# Patient Record
Sex: Female | Born: 1965 | Race: White | Hispanic: No | Marital: Single | Smoking: Never smoker
Health system: Southern US, Community
[De-identification: ages and names within clinical notes are randomized; demographics above are authoritative.]

## PROBLEM LIST (undated history)

## (undated) DIAGNOSIS — E785 Hyperlipidemia, unspecified: Secondary | ICD-10-CM

## (undated) DIAGNOSIS — F419 Anxiety disorder, unspecified: Secondary | ICD-10-CM

## (undated) DIAGNOSIS — K2270 Barrett's esophagus without dysplasia: Secondary | ICD-10-CM

## (undated) DIAGNOSIS — S3992XA Unspecified injury of lower back, initial encounter: Secondary | ICD-10-CM

## (undated) DIAGNOSIS — F32A Depression, unspecified: Secondary | ICD-10-CM

## (undated) DIAGNOSIS — F329 Major depressive disorder, single episode, unspecified: Secondary | ICD-10-CM

## (undated) DIAGNOSIS — R002 Palpitations: Secondary | ICD-10-CM

## (undated) DIAGNOSIS — N390 Urinary tract infection, site not specified: Secondary | ICD-10-CM

## (undated) DIAGNOSIS — G2581 Restless legs syndrome: Secondary | ICD-10-CM

## (undated) DIAGNOSIS — G4700 Insomnia, unspecified: Secondary | ICD-10-CM

## (undated) DIAGNOSIS — K589 Irritable bowel syndrome without diarrhea: Secondary | ICD-10-CM

## (undated) DIAGNOSIS — IMO0002 Reserved for concepts with insufficient information to code with codable children: Secondary | ICD-10-CM

## (undated) DIAGNOSIS — M545 Low back pain, unspecified: Secondary | ICD-10-CM

## (undated) DIAGNOSIS — K6389 Other specified diseases of intestine: Secondary | ICD-10-CM

## (undated) DIAGNOSIS — K219 Gastro-esophageal reflux disease without esophagitis: Secondary | ICD-10-CM

## (undated) DIAGNOSIS — N189 Chronic kidney disease, unspecified: Secondary | ICD-10-CM

## (undated) DIAGNOSIS — T7840XA Allergy, unspecified, initial encounter: Secondary | ICD-10-CM

## (undated) DIAGNOSIS — B009 Herpesviral infection, unspecified: Secondary | ICD-10-CM

## (undated) DIAGNOSIS — R079 Chest pain, unspecified: Secondary | ICD-10-CM

## (undated) DIAGNOSIS — K449 Diaphragmatic hernia without obstruction or gangrene: Secondary | ICD-10-CM

## (undated) HISTORY — DX: Urinary tract infection, site not specified: N39.0

## (undated) HISTORY — DX: Insomnia, unspecified: G47.00

## (undated) HISTORY — DX: Low back pain: M54.5

## (undated) HISTORY — DX: Palpitations: R00.2

## (undated) HISTORY — DX: Allergy, unspecified, initial encounter: T78.40XA

## (undated) HISTORY — DX: Low back pain, unspecified: M54.50

## (undated) HISTORY — DX: Herpesviral infection, unspecified: B00.9

## (undated) HISTORY — PX: UPPER GASTROINTESTINAL ENDOSCOPY: SHX188

## (undated) HISTORY — PX: REFRACTIVE SURGERY: SHX103

## (undated) HISTORY — DX: Anxiety disorder, unspecified: F41.9

## (undated) HISTORY — DX: Major depressive disorder, single episode, unspecified: F32.9

## (undated) HISTORY — DX: Chest pain, unspecified: R07.9

## (undated) HISTORY — DX: Depression, unspecified: F32.A

## (undated) HISTORY — DX: Reserved for concepts with insufficient information to code with codable children: IMO0002

## (undated) HISTORY — DX: Hyperlipidemia, unspecified: E78.5

## (undated) HISTORY — DX: Diaphragmatic hernia without obstruction or gangrene: K44.9

## (undated) HISTORY — DX: Irritable bowel syndrome, unspecified: K58.9

## (undated) HISTORY — DX: Unspecified injury of lower back, initial encounter: S39.92XA

## (undated) HISTORY — DX: Gastro-esophageal reflux disease without esophagitis: K21.9

## (undated) HISTORY — PX: OTHER SURGICAL HISTORY: SHX169

## (undated) HISTORY — DX: Restless legs syndrome: G25.81

## (undated) HISTORY — DX: Chronic kidney disease, unspecified: N18.9

---

## 1999-05-28 HISTORY — PX: EYE SURGERY: SHX253

## 2000-12-25 ENCOUNTER — Other Ambulatory Visit: Admission: RE | Admit: 2000-12-25 | Discharge: 2000-12-25 | Payer: Self-pay | Admitting: Gynecology

## 2003-01-06 ENCOUNTER — Other Ambulatory Visit: Admission: RE | Admit: 2003-01-06 | Discharge: 2003-01-06 | Payer: Self-pay | Admitting: Gynecology

## 2005-03-19 ENCOUNTER — Other Ambulatory Visit: Admission: RE | Admit: 2005-03-19 | Discharge: 2005-03-19 | Payer: Self-pay | Admitting: Gynecology

## 2005-07-22 ENCOUNTER — Encounter: Admission: RE | Admit: 2005-07-22 | Discharge: 2005-07-22 | Payer: Self-pay | Admitting: Sports Medicine

## 2005-08-05 ENCOUNTER — Encounter: Admission: RE | Admit: 2005-08-05 | Discharge: 2005-08-05 | Payer: Self-pay | Admitting: Sports Medicine

## 2006-02-06 ENCOUNTER — Encounter: Admission: RE | Admit: 2006-02-06 | Discharge: 2006-02-06 | Payer: Self-pay | Admitting: Sports Medicine

## 2006-03-25 ENCOUNTER — Other Ambulatory Visit: Admission: RE | Admit: 2006-03-25 | Discharge: 2006-03-25 | Payer: Self-pay | Admitting: Gynecology

## 2006-05-30 ENCOUNTER — Encounter: Admission: RE | Admit: 2006-05-30 | Discharge: 2006-05-30 | Payer: Self-pay | Admitting: Sports Medicine

## 2006-06-13 ENCOUNTER — Encounter: Admission: RE | Admit: 2006-06-13 | Discharge: 2006-06-13 | Payer: Self-pay | Admitting: Sports Medicine

## 2006-09-10 ENCOUNTER — Ambulatory Visit: Payer: Self-pay

## 2006-12-23 ENCOUNTER — Ambulatory Visit: Payer: Self-pay

## 2007-03-04 ENCOUNTER — Ambulatory Visit: Payer: Self-pay | Admitting: Internal Medicine

## 2007-03-04 LAB — CONVERTED CEMR LAB
Basophils Relative: 0.5 % (ref 0.0–1.0)
Eosinophils Absolute: 0.1 10*3/uL (ref 0.0–0.6)
HCT: 37.1 % (ref 36.0–46.0)
MCHC: 34.9 g/dL (ref 30.0–36.0)
Neutro Abs: 5 10*3/uL (ref 1.4–7.7)
Neutrophils Relative %: 64.8 % (ref 43.0–77.0)
RBC: 4.22 M/uL (ref 3.87–5.11)
RDW: 11.7 % (ref 11.5–14.6)

## 2007-03-18 ENCOUNTER — Encounter: Payer: Self-pay | Admitting: Internal Medicine

## 2007-04-13 ENCOUNTER — Other Ambulatory Visit: Admission: RE | Admit: 2007-04-13 | Discharge: 2007-04-13 | Payer: Self-pay | Admitting: Gynecology

## 2007-05-13 ENCOUNTER — Encounter: Admission: RE | Admit: 2007-05-13 | Discharge: 2007-05-13 | Payer: Self-pay | Admitting: Otolaryngology

## 2007-05-13 ENCOUNTER — Encounter: Admission: RE | Admit: 2007-05-13 | Discharge: 2007-05-13 | Payer: Self-pay | Admitting: Sports Medicine

## 2007-06-04 ENCOUNTER — Encounter: Admission: RE | Admit: 2007-06-04 | Discharge: 2007-06-04 | Payer: Self-pay | Admitting: Sports Medicine

## 2008-04-12 ENCOUNTER — Encounter: Payer: Self-pay | Admitting: Pulmonary Disease

## 2008-04-13 ENCOUNTER — Encounter: Payer: Self-pay | Admitting: Gynecology

## 2008-04-13 ENCOUNTER — Other Ambulatory Visit: Admission: RE | Admit: 2008-04-13 | Discharge: 2008-04-13 | Payer: Self-pay | Admitting: Gynecology

## 2008-04-13 ENCOUNTER — Ambulatory Visit: Payer: Self-pay | Admitting: Gynecology

## 2008-05-19 ENCOUNTER — Ambulatory Visit: Payer: Self-pay | Admitting: Pulmonary Disease

## 2008-05-19 DIAGNOSIS — M5126 Other intervertebral disc displacement, lumbar region: Secondary | ICD-10-CM | POA: Insufficient documentation

## 2008-05-19 DIAGNOSIS — G2581 Restless legs syndrome: Secondary | ICD-10-CM | POA: Insufficient documentation

## 2008-05-19 DIAGNOSIS — G4733 Obstructive sleep apnea (adult) (pediatric): Secondary | ICD-10-CM | POA: Insufficient documentation

## 2008-06-03 ENCOUNTER — Telehealth: Payer: Self-pay | Admitting: Pulmonary Disease

## 2008-06-15 ENCOUNTER — Ambulatory Visit: Payer: Self-pay | Admitting: Pulmonary Disease

## 2008-07-04 ENCOUNTER — Telehealth (INDEPENDENT_AMBULATORY_CARE_PROVIDER_SITE_OTHER): Payer: Self-pay | Admitting: *Deleted

## 2008-10-05 ENCOUNTER — Encounter: Payer: Self-pay | Admitting: Internal Medicine

## 2008-10-06 ENCOUNTER — Telehealth (INDEPENDENT_AMBULATORY_CARE_PROVIDER_SITE_OTHER): Payer: Self-pay | Admitting: *Deleted

## 2008-10-17 ENCOUNTER — Encounter (INDEPENDENT_AMBULATORY_CARE_PROVIDER_SITE_OTHER): Payer: Self-pay | Admitting: *Deleted

## 2008-10-17 ENCOUNTER — Ambulatory Visit: Payer: Self-pay | Admitting: Cardiovascular Disease

## 2008-10-17 ENCOUNTER — Encounter: Payer: Self-pay | Admitting: Nurse Practitioner

## 2008-10-19 ENCOUNTER — Telehealth (INDEPENDENT_AMBULATORY_CARE_PROVIDER_SITE_OTHER): Payer: Self-pay

## 2008-10-20 ENCOUNTER — Ambulatory Visit: Payer: Self-pay

## 2008-10-20 ENCOUNTER — Encounter: Payer: Self-pay | Admitting: Internal Medicine

## 2008-10-26 ENCOUNTER — Telehealth (INDEPENDENT_AMBULATORY_CARE_PROVIDER_SITE_OTHER): Payer: Self-pay | Admitting: *Deleted

## 2009-04-14 ENCOUNTER — Ambulatory Visit: Payer: Self-pay | Admitting: Women's Health

## 2009-04-14 ENCOUNTER — Other Ambulatory Visit: Admission: RE | Admit: 2009-04-14 | Discharge: 2009-04-14 | Payer: Self-pay | Admitting: Gynecology

## 2010-04-25 ENCOUNTER — Other Ambulatory Visit: Admission: RE | Admit: 2010-04-25 | Discharge: 2010-04-25 | Payer: Self-pay | Admitting: Gynecology

## 2010-04-25 ENCOUNTER — Ambulatory Visit: Payer: Self-pay | Admitting: Women's Health

## 2010-06-17 ENCOUNTER — Encounter: Payer: Self-pay | Admitting: Sports Medicine

## 2010-10-09 NOTE — Letter (Signed)
March 04, 2007    Pamela Welch, M.D.  [address]  [place], [place] [address]   RE:  Pamela Welch  MRN:  [id number]  /  DOB:  [birth year]   Dear Pamela Welch:   Please forgive me for not getting up with you by telephone. I saw Pamela Welch this morning. I am going to be on vacation for the rest of the  week. 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 This prompted a cardiac evaluation which included a  Myoview in our office that was normal with perfusion defect thought to  be breast attenuation.   She had another episode in July where here breathing just was not normal  and because of that was given a 30 day even monitor which was associated  with similar, but less symptomatic episodes. She is quite clear however  that these were more of the same only less so.   These typically last just moments, clearly less than 60 seconds. She  drinks a fair amount of caffeine five beverages or so a day, as well as  using chocolate, and stress clearly aggravates these.   Her functional capacity is otherwise unimpaired.   CURRENT MEDICATIONS:  1. Lipitor.  2. Zyrtec.   ALLERGIES:  No known drug allergies.   REVIEW OF SYSTEMS:  Is noncontributory.   PAST MEDICAL HISTORY:  In addition to the above is a herniated lumbar  disc for which she has steroid injections which was temporarily  associated with weight gain.   PHYSICAL EXAMINATION:  She is a middle-aged Caucasian female appearing  her stated age of 45. Her blood pressure is 112/32, pulse 72. Weight was  139. She is 5 feet, 7 inches.  HEENT: Demonstrated no icterus or xanthoma.  The neck veins were flat. The carotids are brisk and full bilaterally  without bruits.  BACK: Is without kyphosis, scoliosis.  LUNGS:  Were clear.  HEART: Sounds were regular without murmurs or gallops.  ABDOMEN: Soft with active bowel sounds without midline pulsation or  hepatomegaly.  Femoral  pulses were 2+, distal pulses were intact. There is no clubbing,  cyanosis or edema.  NEUROLOGIC: Examination was grossly normal.  SKIN: Was warm and dry.   Electrocardiogram dated today demonstrated sinus rhythm at 58 with  intervals of 0.17/0.08/0.42. The axis was 48 degrees. The  electrocardiogram was normal.   Review of the event recorder demonstrated symptomatic PVCs, sometimes  coming with variable frequency. Reproducibly that was what was seen.  There was a little bit of sinus arrhythmia.   IMPRESSION:  1. Symptomatic PVCs.  2. Structurally normal heart by Myoview and electrocardiogram.   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 In terms of looking for primary  causes, I thought we should check her TSH and her magnesium and a BMET  to make sure the electrolytes are all okay.   In the event that these are normal, I have reassured her that  prognostically the risks are small and that she should be fine to  continue on about her daily life.   If there is anything further I can do, please do not hesitate to contact  me. Again, thank you for the consultation and forgive me again for not  having been able to get up with you by telephone.    Sincerely,      Duke Salvia, MD, [facility]  Electronically Signed   SCK/MedQ  DD: 03/04/2007  DT: 03/04/2007  Job #: [id number]

## 2011-02-08 ENCOUNTER — Ambulatory Visit: Payer: Self-pay | Admitting: Physician Assistant

## 2011-02-11 ENCOUNTER — Encounter: Payer: Self-pay | Admitting: Physician Assistant

## 2011-02-12 ENCOUNTER — Encounter: Payer: Self-pay | Admitting: Physician Assistant

## 2011-02-12 ENCOUNTER — Ambulatory Visit (INDEPENDENT_AMBULATORY_CARE_PROVIDER_SITE_OTHER): Payer: BC Managed Care – PPO | Admitting: Physician Assistant

## 2011-02-12 VITALS — BP 117/81 | HR 92 | Resp 18 | Ht 69.0 in | Wt 152.0 lb

## 2011-02-12 DIAGNOSIS — R079 Chest pain, unspecified: Secondary | ICD-10-CM

## 2011-02-12 DIAGNOSIS — R0602 Shortness of breath: Secondary | ICD-10-CM

## 2011-02-12 DIAGNOSIS — R42 Dizziness and giddiness: Secondary | ICD-10-CM

## 2011-02-12 NOTE — Progress Notes (Signed)
History of Present Illness: Primary Electrophysiologist:  Dr. Sherryl Manges   Pamela Welch is a 45 y.o. female who presents for chest pain.  She has a h/o symptomatic PVCs and atypical chest pain. She is status post normal Myoview in 2000.  She was last seen in 5/10.  At that time she underwent an echocardiogram that demonstrated an EF of 60-65% mild LVH.  She was to have a stress test performed due to chest pain and increased palpitations.  However, this was never performed.  She has had a series of symptoms recently.  She has been noticing lightheadedness with standing for the last several weeks.  She notes associated tachy-palps.  No chest pain with this.  She does note some dyspnea with exertion.  She typically walks on the treadmill for 3 miles every day.  She had one episode of dizziness with going up the steps after getting off the treadmill 3 weeks ago.  She felt near syncopal.  Does not think she passed out.  She felt dyspneic and nauseated and had some chest pain.  She says her symptoms lasted about 40 minutes.  She felt completely normal after this.  She is able to walk on the treadmill without symptoms.  She notes problems with dizziness with increasing activity like jogging.  She denies orthopnea, PND or edema.  She has chronic palpitations.  She denies any changes.  She does note a h/o getting lightheaded back in April and fell and hit her head and was told she had a concussion.  She went to her PCP.  She was told her EKG was abnormal and she should follow up here.  Past Medical History  Diagnosis Date  . Chest pain, unspecified     normal myoview 2000;  echo 5/10:  mild LVH, EF 60-65%  . Palpitations     h/o symptomatic PVCs  . Restless legs syndrome (RLS)   . Obstructive sleep apnea (adult) (pediatric)   . Persistent disorder of initiating or maintaining sleep   . Anxiety   . Hyperlipidemia   . Herniated disc     Current Outpatient Prescriptions  Medication Sig Dispense Refill    . aspirin 81 MG tablet Take 81 mg by mouth daily.        Marland Kitchen atorvastatin (LIPITOR) 40 MG tablet Take 40 mg by mouth daily.        . Cholecalciferol (VITAMIN D3) 2000 UNITS TABS Take 1 tablet by mouth 2 (two) times daily.        . Coral Calcium 1000 (390 CA) MG TABS Take 1 tablet by mouth daily.        Marland Kitchen LORazepam (ATIVAN) 1 MG tablet Take 1 mg by mouth at bedtime.        . Magnesium 400 MG CAPS Take 1 capsule by mouth 2 (two) times daily.        . Multiple Vitamin (MULTIVITAMIN) tablet Take 1 tablet by mouth daily.        . Omega-3 Fatty Acids (FISH OIL) 1200 MG CAPS Take by mouth. Take about 2-3 times a week         Allergies: No Known Allergies  Social history:  Nonsmoker.  Rare alcohol  Family history:  No CAD in any first degree relatives.  She did have a grandmother and grandfather both died of myocardial infarction her 88s.  She has cousins and aunts who also had CAD.  ROS:  Please see the history of present illness.  All other  systems reviewed and negative.   Vital Signs: BP 120/80  Pulse 78  Resp 18  Ht 5\' 9"  (1.753 m)  Wt 152 lb (68.947 kg)  BMI 22.45 kg/m2  Filed Vitals:   02/12/11 1649 02/12/11 1650 02/12/11 1651 02/12/11 1652  BP: 111/73 116/78 116/81 117/81  Pulse: 79 82 89 92  Resp:      Height:      Weight:        PHYSICAL EXAM: Well nourished, well developed, in no acute distress HEENT: normal Neck: no JVD Endocrine: No thyromegaly Vascular: No carotid bruits Cardiac:  normal S1, S2; RRR; no murmur Lungs:  clear to auscultation bilaterally, no wheezing, rhonchi or rales Abd: soft, nontender, no hepatomegaly Ext: no edema Skin: warm and dry Neuro:  CNs 2-12 intact, no focal abnormalities noted Psych: Normal affect  EKG:  Sinus rhythm, heart rate 78, normal axis, Q waves in V1-V2, nonspecific ST-T wave changes, no significant change compared to prior tracings  ASSESSMENT AND PLAN:

## 2011-02-12 NOTE — Patient Instructions (Signed)
Your physician has requested that you have en exercise stress myoview DX SOB 786.05, CHEST PAIN 786.50 DIZZINESS 780.4. For further information please visit [email or web address] Please follow instruction sheet, as given.  Your physician has requested that you have an echocardiogram DX DIZZINESS 780.4, SOB 786.05. Echocardiography is a painless test that uses sound waves to create images of your heart. It provides your doctor with information about the size and shape of your heart and how well your heart's chambers and valves are working. This procedure takes approximately one hour. There are no restrictions for this procedure.   Your physician has recommended that you wear an event monitor DX 786.05 SOB, DIZZINESS 780.4. Event monitors are medical devices that record the heart's electrical activity. Doctors most often [place] these monitors to diagnose arrhythmias. Arrhythmias are problems with the speed or rhythm of the heartbeat. The monitor is a small, portable device. You can wear one while you do your normal daily activities. This is usually used to diagnose what is causing palpitations/syncope (passing out).   Your physician recommends that you schedule a follow-up appointment in: 04/01/11 @ 2 PM TO SEE [employer], PA-C

## 2011-02-12 NOTE — Assessment & Plan Note (Signed)
Proceed with Myoview and echocardiogram as noted.

## 2011-02-12 NOTE — Assessment & Plan Note (Signed)
Proceed with Myoview as noted.  

## 2011-02-12 NOTE — Assessment & Plan Note (Addendum)
Etiology unclear.  She does report a history of yellow-colored urine.  Some of her symptoms sound orthostatic in nature.  We will check orthostatic vital signs today.  I recommended that she hydrate herself well.  I will asked for labs obtained by her PCP recently.  If she has not had a CBC, BMET, TSH, these will be obtained.  Her symptoms seem to occur with exertion.  She is quite concerned about having CAD.  I will have her undergo a stress Myoview.  Her ECG is unchanged.  We will try to obtain an ECG from her PCPs office.  I will have her undergo an echocardiogram.  I will also have her undergo an event monitor.  She can followup in 4-6 weeks after the above testing is completed with either Dr. Graciela Husbands or me.  Did receive labs at end of visit:  BUN 14, creatinine 1.23, TC 151, TG 122, HDL 60, LDL 67, Mg 1.9, TSH 1.622, Hgb 13.8, K 4.9. ECG reviewed and unchanged when compared with today's tracing.

## 2011-02-18 ENCOUNTER — Encounter: Payer: Self-pay | Admitting: *Deleted

## 2011-02-22 ENCOUNTER — Encounter: Payer: Self-pay | Admitting: Physician Assistant

## 2011-02-26 ENCOUNTER — Encounter (INDEPENDENT_AMBULATORY_CARE_PROVIDER_SITE_OTHER): Payer: BC Managed Care – PPO

## 2011-02-26 ENCOUNTER — Ambulatory Visit (HOSPITAL_BASED_OUTPATIENT_CLINIC_OR_DEPARTMENT_OTHER): Payer: BC Managed Care – PPO | Admitting: Radiology

## 2011-02-26 ENCOUNTER — Ambulatory Visit (HOSPITAL_COMMUNITY): Payer: BC Managed Care – PPO | Attending: Cardiology

## 2011-02-26 DIAGNOSIS — R42 Dizziness and giddiness: Secondary | ICD-10-CM

## 2011-02-26 DIAGNOSIS — R0602 Shortness of breath: Secondary | ICD-10-CM

## 2011-02-26 DIAGNOSIS — R0609 Other forms of dyspnea: Secondary | ICD-10-CM | POA: Insufficient documentation

## 2011-02-26 DIAGNOSIS — R079 Chest pain, unspecified: Secondary | ICD-10-CM

## 2011-02-26 DIAGNOSIS — R0989 Other specified symptoms and signs involving the circulatory and respiratory systems: Secondary | ICD-10-CM

## 2011-02-26 DIAGNOSIS — E785 Hyperlipidemia, unspecified: Secondary | ICD-10-CM | POA: Insufficient documentation

## 2011-02-26 DIAGNOSIS — R072 Precordial pain: Secondary | ICD-10-CM

## 2011-02-26 DIAGNOSIS — R55 Syncope and collapse: Secondary | ICD-10-CM

## 2011-02-26 DIAGNOSIS — I491 Atrial premature depolarization: Secondary | ICD-10-CM

## 2011-02-26 DIAGNOSIS — I4949 Other premature depolarization: Secondary | ICD-10-CM

## 2011-02-26 DIAGNOSIS — R0789 Other chest pain: Secondary | ICD-10-CM

## 2011-02-26 MED ORDER — TECHNETIUM TC 99M TETROFOSMIN IV KIT
33.0000 | PACK | Freq: Once | INTRAVENOUS | Status: AC | PRN
  Administered 2011-02-26: 33 via INTRAVENOUS

## 2011-02-26 MED ORDER — TECHNETIUM TC 99M TETROFOSMIN IV KIT
11.0000 | PACK | Freq: Once | INTRAVENOUS | Status: AC | PRN
  Administered 2011-02-26: 11 via INTRAVENOUS

## 2011-02-26 NOTE — Progress Notes (Signed)
[facility] SITE 3 NUCLEAR MED [address] [place] [address] [phone]  Cardiology Nuclear Med Study  Pamela Welch is a 45 y.o. female [id number] Oct 09, 1965   Nuclear Med Background Indication for Stress Test:  Evaluation for Ischemia History: 05/10 Echo: EF 60-65% mild LVH and 04/08 Myocardial Perfusion Study: EF 64% fixed ant. Defect--breast attennuation Cardiac Risk Factors: Family History - CAD and Lipids  Symptoms:  Chest Tightness (last date of chest tightness was 2 weeks ago, Dizziness, DOE, Light-Headedness, Nausea, Near Syncope and Palpitations   Nuclear Pre-Procedure Caffeine/Decaff Intake:  9:00pm NPO After: 9:00pm   Lungs:  clear IV 0.9% NS with Angio Cath:  20g  IV Site: R Hand  IV Started by:  Cathlyn Parsons, RN  Chest Size (in):  36 Cup Size: B  Height: 5\' 9"  (1.753 m)  Weight:  149 lb (67.586 kg)  BMI:  Body mass index is 22.00 kg/(m^2). Tech Comments:  n/a    Nuclear Med Study 1 or 2 day study: 1 day  Stress Test Type:  Stress  Reading MD: Willa Rough, MD  Order Authorizing Provider:  S.Klein/S.Weaver  Resting Radionuclide: Technetium 38m Tetrofosmin  Resting Radionuclide Dose: 11.0 mCi   Stress Radionuclide:  Technetium 41m Tetrofosmin  Stress Radionuclide Dose: 33.0 mCi           Stress Protocol Rest HR: 71 Stress HR: 151  Rest BP: 100/73 Stress BP: 127/67  Exercise Time (min): 11:00 METS: 13.40   Predicted Max HR: 174 bpm % Max HR: 86.78 bpm Rate Pressure Product: 65784   Dose of Adenosine (mg):  n/a Dose of Lexiscan: n/a mg  Dose of Atropine (mg): n/a Dose of Dobutamine: n/a mcg/kg/min (at max HR)  Stress Test Technologist: Milana Na, EMT-P  Nuclear Technologist:  Domenic Polite, CNMT     Rest Procedure:  Myocardial perfusion imaging was performed at rest 45 minutes following the intravenous administration of Technetium 29m Tetrofosmin. Rest ECG: NSR  Stress Procedure:  The patient exercised for 11:00.   The patient stopped due to fatigue and denied any chest pain.  There were non specific ST-T wave changes and rare pvc/pacs.  Technetium 43m Tetrofosmin was injected at peak exercise and myocardial perfusion imaging was performed after a brief delay. Stress ECG: No significant ST segment change suggestive of ischemia.  QPS Raw Data Images:  Normal; no motion artifact; normal heart/lung ratio. Stress Images:  Anterior breast attenuation Rest Images:  Same as stress Subtraction (SDS):  No evidence of ischemia. Transient Ischemic Dilatation (Normal <1.22):  1.03 Lung/Heart Ratio (Normal <0.45):  0.33  Quantitative Gated Spect Images QGS EDV:  75 ml QGS ESV:  26 ml QGS cine images:  Normal Wall Motion QGS EF: 66%  Impression Exercise Capacity:  Good exercise capacity. BP Response:  Normal blood pressure response. Clinical Symptoms:  No chest pain. ECG Impression:  No significant ST segment change suggestive of ischemia. Comparison with Prior Nuclear Study: No significant change from previous study  Overall Impression:  Normal stress nuclear study. Anterior breast attenuation.  Willa Rough

## 2011-03-21 ENCOUNTER — Ambulatory Visit: Payer: BC Managed Care – PPO | Admitting: Physician Assistant

## 2011-03-25 ENCOUNTER — Ambulatory Visit (INDEPENDENT_AMBULATORY_CARE_PROVIDER_SITE_OTHER): Payer: BC Managed Care – PPO | Admitting: Physician Assistant

## 2011-03-25 VITALS — BP 118/78 | HR 80 | Ht 69.0 in | Wt 150.4 lb

## 2011-03-25 DIAGNOSIS — R002 Palpitations: Secondary | ICD-10-CM

## 2011-03-25 NOTE — Patient Instructions (Signed)
Your physician recommends that you schedule a follow-up appointment in: as needed  

## 2011-03-25 NOTE — Assessment & Plan Note (Signed)
Reassurance.  I suspect she experienced symptomatic PVCs exacerbated by anxiety.  No further cardiac workup.  Follow up with cardiology PRN.

## 2011-03-25 NOTE — Progress Notes (Signed)
History of Present Illness: Primary Electrophysiologist:  Dr. Sherryl Manges   Pamela Welch is a 45 y.o. female who presents for follow up.  She has a h/o symptomatic PVCs and atypical chest pain. She is status post normal Myoview in 2000.  She was last seen in 5/10.  At that time she underwent an echocardiogram that demonstrated an EF of 60-65% mild LVH.  She was to have a stress test performed due to chest pain and increased palpitations.  However, this was never performed.  I saw her 9/18 for lightheadedness with standing for several weeks with associated tachy-palps.  She did note some dyspnea with exertion.  She typically walks on the treadmill for 3 miles every day.  She had one episode of dizziness with going up steps and felt near syncopal.  She also felt dyspneic and nauseated and had some chest pain.  Her symptoms lasted about 40 minutes and felt completely normal after this.    I arranged a Myoview 02/26/11 and this was normal with EF 66%.  Echo 02/26/11: normal LVF.  Event monitor was also obtained and just reviewed by me today.  One series of strips available and this demonstrates NSR.    She is feeling much better. We discussed the results of all her tests.  She denies chest pain, dyspnea, syncope.  She still has occasional palpitations.  She is watching her caffeine and working on stress.  She did have a problem with the monitor and was only able to wear it for a few days.   Past Medical History  Diagnosis Date  . Chest pain, unspecified     normal myoview 2000;  echo 5/10:  mild LVH, EF 60-65%  . Palpitations     h/o symptomatic PVCs  . Restless legs syndrome (RLS)   . Obstructive sleep apnea (adult) (pediatric)   . Persistent disorder of initiating or maintaining sleep   . Anxiety   . Hyperlipidemia   . Herniated disc     Current Outpatient Prescriptions  Medication Sig Dispense Refill  . aspirin 81 MG tablet Take 81 mg by mouth daily.        Marland Kitchen atorvastatin (LIPITOR) 40 MG  tablet Take 40 mg by mouth daily.        . Cholecalciferol (VITAMIN D3) 2000 UNITS TABS Take 1 tablet by mouth 2 (two) times daily.        . Coral Calcium 1000 (390 CA) MG TABS Take 1 tablet by mouth daily.        Marland Kitchen LORazepam (ATIVAN) 1 MG tablet Take 1 mg by mouth at bedtime.        . Magnesium 400 MG CAPS Take 1 capsule by mouth 2 (two) times daily.        . Multiple Vitamin (MULTIVITAMIN) tablet Take 1 tablet by mouth daily.        . Omega-3 Fatty Acids (FISH OIL) 1200 MG CAPS Take by mouth. Take about 2-3 times a week         Allergies: No Known Allergies   Vital Signs: BP 118/78  Pulse 80  Ht 5\' 9"  (1.753 m)  Wt 150 lb 6.4 oz (68.221 kg)  BMI 22.21 kg/m2  PHYSICAL EXAM: Well nourished, well developed, in no acute distress HEENT: normal Neck: no JVD Cardiac:  normal S1, S2; RRR; no murmur Lungs:  clear to auscultation bilaterally, no wheezing, rhonchi or rales Abd: soft, nontender, no hepatomegaly Ext: no edema Skin: warm and dry Neuro:  CNs 2-12 intact, no focal abnormalities noted  ASSESSMENT AND PLAN:

## 2011-04-01 ENCOUNTER — Ambulatory Visit: Payer: BC Managed Care – PPO | Admitting: Physician Assistant

## 2011-04-29 DIAGNOSIS — E785 Hyperlipidemia, unspecified: Secondary | ICD-10-CM | POA: Insufficient documentation

## 2011-05-06 ENCOUNTER — Ambulatory Visit (INDEPENDENT_AMBULATORY_CARE_PROVIDER_SITE_OTHER): Payer: BC Managed Care – PPO | Admitting: Women's Health

## 2011-05-06 ENCOUNTER — Other Ambulatory Visit (HOSPITAL_COMMUNITY)
Admission: RE | Admit: 2011-05-06 | Discharge: 2011-05-06 | Disposition: A | Discharge status: Home / Self Care | Payer: BC Managed Care – PPO | Source: Ambulatory Visit | Attending: Obstetrics and Gynecology | Admitting: Obstetrics and Gynecology

## 2011-05-06 ENCOUNTER — Encounter: Payer: Self-pay | Admitting: Women's Health

## 2011-05-06 VITALS — BP 108/74 | Ht 69.0 in | Wt 149.0 lb

## 2011-05-06 DIAGNOSIS — Z01419 Encounter for gynecological examination (general) (routine) without abnormal findings: Secondary | ICD-10-CM | POA: Insufficient documentation

## 2011-05-06 NOTE — Progress Notes (Signed)
RAYVN RICKERSON 1965-07-06 [id number]    History:    The patient presents for annual exam.  Twin daughters now almost 76, have completed Gardasil series, half custody with ex partner.  Past medical history, past surgical history, family history and social history were all reviewed and documented in the EPIC chart.   ROS:  A  ROS was performed and pertinent positives and negatives are included in the history.  Exam:  Filed Vitals:   05/06/11 1119  BP: 108/74    General appearance:  Normal Head/Neck:  Normal, without cervical or supraclavicular adenopathy. Thyroid:  Symmetrical, normal in size, without palpable masses or nodularity. Respiratory  Effort:  Normal  Auscultation:  Clear without wheezing or rhonchi Cardiovascular  Auscultation:  Regular rate, without rubs, murmurs or gallops  Edema/varicosities:  Not grossly evident Abdominal  Soft,nontender, without masses, guarding or rebound.  Liver/spleen:  No organomegaly noted  Hernia:  None appreciated  Skin  Inspection:  Grossly normal  Palpation:  Grossly normal Neurologic/psychiatric  Orientation:  Normal with appropriate conversation.  Mood/affect:  Normal  Genitourinary    Breasts: Examined lying and sitting.     Right: Without masses, retractions, discharge or axillary adenopathy.     Left: Without masses, retractions, discharge or axillary adenopathy.   Inguinal/mons:  Normal without inguinal adenopathy  External genitalia:  Normal  BUS/Urethra/Skene's glands:  Normal  Bladder:  Normal  Vagina:  Normal  Cervix:  Normal  Uterus:  normal in size, shape and contour.  Midline and mobile  Adnexa/parametria:     Rt: Without masses or tenderness.   Lt: Without masses or tenderness.  Anus and perineum: Normal  Digital rectal exam: Normal sphincter tone without palpated masses or tenderness  Assessment/Plan:  [age] y.o.  S WF G0 for annual exam. Monthly 4-5 day cycle/lesbian. Has lost about 30 pounds with diet and  exercise in the past year. History of increased cholesterol primary care manages. History of normal mammograms and Paps. Significant history Sister with breast cancer at age 46 negative BRCA.  Normal GYN exam Hypercholesteremia/primary care labs and meds  Plan: Pap only. SBEs, continue annual screening, continue healthy lifestyle with diet and exercise.   Harrington Challenger WHNP, 11:59 AM 05/06/2011

## 2011-05-28 HISTORY — PX: COLONOSCOPY: SHX174

## 2011-10-23 ENCOUNTER — Encounter: Payer: Self-pay | Admitting: Gastroenterology

## 2011-10-23 ENCOUNTER — Ambulatory Visit (INDEPENDENT_AMBULATORY_CARE_PROVIDER_SITE_OTHER): Payer: BC Managed Care – PPO | Admitting: Gynecology

## 2011-10-23 ENCOUNTER — Encounter: Payer: Self-pay | Admitting: Gynecology

## 2011-10-23 ENCOUNTER — Telehealth: Payer: Self-pay | Admitting: *Deleted

## 2011-10-23 ENCOUNTER — Ambulatory Visit (INDEPENDENT_AMBULATORY_CARE_PROVIDER_SITE_OTHER): Payer: BC Managed Care – PPO

## 2011-10-23 ENCOUNTER — Other Ambulatory Visit: Payer: Self-pay | Admitting: *Deleted

## 2011-10-23 VITALS — BP 112/78

## 2011-10-23 DIAGNOSIS — R102 Pelvic and perineal pain: Secondary | ICD-10-CM

## 2011-10-23 DIAGNOSIS — N949 Unspecified condition associated with female genital organs and menstrual cycle: Secondary | ICD-10-CM

## 2011-10-23 DIAGNOSIS — K5900 Constipation, unspecified: Secondary | ICD-10-CM

## 2011-10-23 DIAGNOSIS — N831 Corpus luteum cyst of ovary, unspecified side: Secondary | ICD-10-CM

## 2011-10-23 DIAGNOSIS — R143 Flatulence: Secondary | ICD-10-CM

## 2011-10-23 DIAGNOSIS — D252 Subserosal leiomyoma of uterus: Secondary | ICD-10-CM

## 2011-10-23 DIAGNOSIS — R141 Gas pain: Secondary | ICD-10-CM

## 2011-10-23 DIAGNOSIS — R140 Abdominal distension (gaseous): Secondary | ICD-10-CM

## 2011-10-23 DIAGNOSIS — R1032 Left lower quadrant pain: Secondary | ICD-10-CM

## 2011-10-23 DIAGNOSIS — D259 Leiomyoma of uterus, unspecified: Secondary | ICD-10-CM

## 2011-10-23 LAB — CBC WITH DIFFERENTIAL/PLATELET
Basophils Absolute: 0 10*3/uL (ref 0.0–0.1)
HCT: 38.4 % (ref 36.0–46.0)
Hemoglobin: 13.5 g/dL (ref 12.0–15.0)
Lymphocytes Relative: 29 % (ref 12–46)
Monocytes Absolute: 0.4 10*3/uL (ref 0.1–1.0)
Neutro Abs: 4.5 10*3/uL (ref 1.7–7.7)
RDW: 13.1 % (ref 11.5–15.5)
WBC: 7.2 10*3/uL (ref 4.0–10.5)

## 2011-10-23 LAB — COMPREHENSIVE METABOLIC PANEL
ALT: 13 U/L (ref 0–35)
AST: 17 U/L (ref 0–37)
Calcium: 9.1 mg/dL (ref 8.4–10.5)
Creat: 1.21 mg/dL — ABNORMAL HIGH (ref 0.50–1.10)
Potassium: 4.7 mEq/L (ref 3.5–5.3)
Sodium: 137 mEq/L (ref 135–145)
Total Bilirubin: 0.5 mg/dL (ref 0.3–1.2)
Total Protein: 6.7 g/dL (ref 6.0–8.3)

## 2011-10-23 NOTE — Progress Notes (Signed)
Patient is a 46 year old (same-sex relationship) presented to the office today complaining of left lower quadrant discomfort on and off throughout the day regardless of meals along with bloating for one month. She's having normal menstrual cycles. She's not using any form of hormones. She states that for the past 6 months her bowel movements occur every 2-3 days with intermittent diarrhea in between regardless of what she eats. She is self conscious and had a well-balanced diet high in fiber. She has informed me that on her maternal side both grandparents have had colon cancer. Her sister at the age of 53 was diagnosed with breast cancer and had a double mastectomy recently. Her sister had negative BRCA1 and BRCA2 gene mutation. Patient's last mammogram was January 2013 which was normal and she does her monthly self breast examinations.  Exam: Bartholin urethra Skene glands within normal limits Vagina: No lesions or discharge Cervix: No lesions or discharge Uterus/adnexa difficult to assess due to patient's vaginismus Rectal: Small external tear possibly from constipation and straining.  Ultrasound today:  Uterus measures 7.8 x 5.8 x 4.0 cm with an endometrial stripe is 7 mm. She had 2 small intramural myomas the largest one measuring 20 x 20 mm. Right prominent endometrial cavity focal tiny cystic area measuring 11 x 9 mm negative color flow. Right ovary had a thick wall cyst irregular wall measuring 23 x 24 x 16 mm with positive color flow consistent with a corpus luteum cyst the left ovary was otherwise normal and there was no fluid in the cul-de-sac.  Assessment/plan patient's symptomatology of bloating and left lower abdominal discomfort does not appear to be GYN related. We'll will refer patient to my GI colleagues for further evaluation. We'll draw CBC and comprehensive metabolic panel today. We'll have the patient return back in 3 months for followup ultrasound.

## 2011-10-23 NOTE — Telephone Encounter (Signed)
Message copied by Libby Maw on Wed Oct 23, 2011  2:08 PM ------      Message from: Ok Edwards      Created: Wed Oct 23, 2011  1:53 PM       Baker Kogler, please schedule a consult with Dr. Jarold Motto or Dr. Dickie La gastroenterologist for this patient complaining of abdominal bloating and left lower abdominal pains. See dictation from today's office notes. Patient cell phone number is [phone]

## 2011-10-23 NOTE — Telephone Encounter (Signed)
Patient informed appt information. 

## 2011-10-23 NOTE — Telephone Encounter (Signed)
Corin Formisano, please schedule a consult with Dr. Jarold Motto or Dr. Dickie La gastroenterologist for this patient complaining of abdominal bloating and left lower abdominal pains. See dictation from today's office notes. Patient cell phone number is [phone]               Lm for patient to call.  Appt set up with Dr. Jarold Motto on 6/21/013 @ 9:30am.

## 2011-10-24 ENCOUNTER — Other Ambulatory Visit: Payer: Self-pay | Admitting: *Deleted

## 2011-10-24 DIAGNOSIS — N289 Disorder of kidney and ureter, unspecified: Secondary | ICD-10-CM

## 2011-10-24 LAB — URINALYSIS W MICROSCOPIC + REFLEX CULTURE
Bilirubin Urine: NEGATIVE
Crystals: NONE SEEN
Nitrite: NEGATIVE
Specific Gravity, Urine: 1.021 (ref 1.005–1.030)
Urobilinogen, UA: 0.2 mg/dL (ref 0.0–1.0)

## 2011-10-29 ENCOUNTER — Other Ambulatory Visit: Payer: BC Managed Care – PPO

## 2011-10-29 DIAGNOSIS — N289 Disorder of kidney and ureter, unspecified: Secondary | ICD-10-CM

## 2011-10-29 LAB — BUN: BUN: 15 mg/dL (ref 6–23)

## 2011-10-29 LAB — CREATININE, SERUM: Creat: 1.2 mg/dL — ABNORMAL HIGH (ref 0.50–1.10)

## 2011-10-30 ENCOUNTER — Other Ambulatory Visit: Payer: BC Managed Care – PPO

## 2011-10-30 LAB — URINALYSIS W MICROSCOPIC + REFLEX CULTURE
Bacteria, UA: NONE SEEN
Bilirubin Urine: NEGATIVE
Ketones, ur: NEGATIVE mg/dL
Protein, ur: NEGATIVE mg/dL
Squamous Epithelial / LPF: NONE SEEN
Urobilinogen, UA: 0.2 mg/dL (ref 0.0–1.0)

## 2011-11-08 ENCOUNTER — Encounter: Payer: Self-pay | Admitting: *Deleted

## 2011-11-15 ENCOUNTER — Ambulatory Visit (INDEPENDENT_AMBULATORY_CARE_PROVIDER_SITE_OTHER): Payer: BC Managed Care – PPO | Admitting: Gastroenterology

## 2011-11-15 ENCOUNTER — Encounter: Payer: Self-pay | Admitting: Gastroenterology

## 2011-11-15 VITALS — BP 110/70 | HR 75 | Ht 69.0 in | Wt 145.6 lb

## 2011-11-15 DIAGNOSIS — K5900 Constipation, unspecified: Secondary | ICD-10-CM

## 2011-11-15 DIAGNOSIS — Z800 Family history of malignant neoplasm of digestive organs: Secondary | ICD-10-CM

## 2011-11-15 MED ORDER — MOVIPREP 100 G PO SOLR: 1.0000 | Freq: Once | ORAL | Status: DC

## 2011-11-15 MED ORDER — PSYLLIUM 0.52 G PO CAPS: 0.5200 g | ORAL_CAPSULE | Freq: Every day | ORAL | Status: AC

## 2011-11-15 NOTE — Progress Notes (Signed)
History of Present Illness:  This is a extremely pleasant 46 year old Caucasian female who is in good health, but has had recent onset constipation and vague lower gum discomfort without rectal bleeding. Currently she is on MiraLax with some improvement. She has not had previous colonoscopy or barium studies. She denies upper gastrointestinal or hepatobiliary or general medical problems. Her family history is remarkable be positive for colon cancer in 2 grandmothers, one grandfather, and she has a sister with breast cancer. The patient follows a regular diet as lost 30 pounds over the last 2 years with dietary restriction. She has a history of PVCs with previous cardiac evaluation otherwise negative. She has borderline sleep apnea but does not use a CPAP machine. She has chronic anxiety alleviated with when necessary Ativan. Patient is on aspirin 81 mg a day.   I have reviewed this patient's present history, medical and surgical past history, allergies and medications.     ROS: The remainder of the 10 point ROS is negative     Physical Exam: Healthy patient in no distress appears stated age. Blood pressure 110/70, pulse 75 and regular and weight is 145 pounds with a BMI of 21.50. General well developed well nourished patient in no acute distress, appearing her stated age Eyes PERRLA, no icterus, fundoscopic exam per opthamologist Skin no lesions noted Neck supple, no adenopathy, no thyroid enlargement, no tenderness Chest clear to percussion and auscultation Heart no significant murmurs, gallops or rubs noted Abdomen no hepatosplenomegaly masses or tenderness, BS normal. Slight tenderness of the sigmoid colon the left lower quadrant. Rectal inspection normal no fissures, or fistulae noted.  No masses or tenderness on digital exam. Stool guaiac negative. Extremities no acute joint lesions, edema, phlebitis or evidence of cellulitis. Neurologic patient oriented x 3, cranial nerves intact, no  focal neurologic deficits noted. Psychological mental status normal and normal affect.  Assessment and plan: New-onset constipation and otherwise healthy 46 year old female with a very strong family history of colon cancer. I've scheduled her for screening colonoscopy, have advised I high-fiber diet with daily Metamucil and liberal by mouth fluids with when necessary MiraLax use. Review of her labs shows no evidence of metabolic disturbances or anemia. She does have a mildly elevated serum creatinine of 1.2 mg percent.  Please copy Dr. Lucky Cowboy and Dr. Lily Peer in gynecology.  Encounter Diagnoses  Name Primary?  . Family hx of colon cancer Yes  . Unspecified constipation

## 2011-11-15 NOTE — Patient Instructions (Addendum)
You have been scheduled for a colonoscopy with propofol. Please follow written instructions given to you at your visit today.  Please pick up your prep kit at the pharmacy within the next 1-3 days. Start taking over the counter Metamucil once daily for constipation.  cc: Lucky Cowboy, MD  Constipation in Adults Constipation is having fewer than 2 bowel movements per week. Usually, the stools are hard. As we grow older, constipation is more common. If you try to fix constipation with laxatives, the problem may get worse. This is because laxatives taken over a long period of time make the colon muscles weaker. A low-fiber diet, not taking in enough fluids, and taking some medicines may make these problems worse. MEDICATIONS THAT MAY CAUSE CONSTIPATION  Water pills (diuretics).   Calcium channel blockers (used to control blood pressure and for the heart).   Certain pain medicines (narcotics).   Anticholinergics.   Anti-inflammatory agents.   Antacids that contain aluminum.  DISEASES THAT CONTRIBUTE TO CONSTIPATION  Diabetes.   Parkinson's disease.   Dementia.   Stroke.   Depression.   Illnesses that cause problems with salt and water metabolism.  HOME CARE INSTRUCTIONS   Constipation is usually best cared for without medicines. Increasing dietary fiber and eating more fruits and vegetables is the best way to manage constipation.   Slowly increase fiber intake to 25 to 38 grams per day. Whole grains, fruits, vegetables, and legumes are good sources of fiber. A dietitian can further help you incorporate high-fiber foods into your diet.   Drink enough water and fluids to keep your urine clear or pale yellow.   A fiber supplement may be added to your diet if you cannot get enough fiber from foods.   Increasing your activities also helps improve regularity.   Suppositories, as suggested by your caregiver, will also help. If you are using antacids, such as aluminum or calcium  containing products, it will be helpful to switch to products containing magnesium if your caregiver says it is okay.   If you have been given a liquid injection (enema) today, this is only a temporary measure. It should not be relied on for treatment of longstanding (chronic) constipation.   Stronger measures, such as magnesium sulfate, should be avoided if possible. This may cause uncontrollable diarrhea. Using magnesium sulfate may not allow you time to make it to the bathroom.  SEEK IMMEDIATE MEDICAL CARE IF:   There is bright red blood in the stool.   The constipation stays for more than 4 days.   There is belly (abdominal) or rectal pain.   You do not seem to be getting better.   You have any questions or concerns.  MAKE SURE YOU:   Understand these instructions.   Will watch your condition.   Will get help right away if you are not doing well or get worse.  Document Released: 02/09/2004 Document Revised: 05/02/2011 Document Reviewed: 04/16/2011 [facility] Patient Information 2012 [place], [place].

## 2011-11-19 ENCOUNTER — Telehealth: Payer: Self-pay | Admitting: Gastroenterology

## 2011-11-19 NOTE — Telephone Encounter (Signed)
Pt called to update her medicine list... Added generic Wellbutrin to her list.  300mg  every AM.

## 2011-11-25 ENCOUNTER — Other Ambulatory Visit: Payer: Self-pay

## 2011-11-25 ENCOUNTER — Ambulatory Visit (AMBULATORY_SURGERY_CENTER): Payer: BC Managed Care – PPO | Admitting: Gastroenterology

## 2011-11-25 ENCOUNTER — Encounter: Payer: Self-pay | Admitting: Gastroenterology

## 2011-11-25 VITALS — BP 120/79 | HR 75 | Temp 99.2°F | Resp 22 | Ht 69.0 in | Wt 145.0 lb

## 2011-11-25 DIAGNOSIS — Z1211 Encounter for screening for malignant neoplasm of colon: Secondary | ICD-10-CM

## 2011-11-25 DIAGNOSIS — Z800 Family history of malignant neoplasm of digestive organs: Secondary | ICD-10-CM

## 2011-11-25 DIAGNOSIS — K5900 Constipation, unspecified: Secondary | ICD-10-CM

## 2011-11-25 MED ORDER — LINACLOTIDE 145 MCG PO CAPS: 145.0000 ug | ORAL_CAPSULE | Freq: Every day | ORAL | Status: DC

## 2011-11-25 MED ORDER — SODIUM CHLORIDE 0.9 % IV SOLN: 500.0000 mL | INTRAVENOUS | Status: DC

## 2011-11-25 NOTE — Progress Notes (Signed)
Patient did not experience any of the following events: a burn prior to discharge; a fall within the facility; wrong site/side/patient/procedure/implant event; or a hospital transfer or hospital admission upon discharge from the facility. (G8907) Patient did not have preoperative order for IV antibiotic SSI prophylaxis. (G8918)  

## 2011-11-25 NOTE — Op Note (Signed)
[facility] [address] [place], [place]  [address]  COLONOSCOPY PROCEDURE REPORT  PATIENT:  Pamela Welch, Pamela Welch  MR#:  [id number] BIRTHDATE:  07-12-1965, 46 yrs. old  GENDER:  female ENDOSCOPIST:  Vania Rea. Jarold Motto, MD, [facility] REF. BY:  Lucky Cowboy, M.D. PROCEDURE DATE:  11/25/2011 PROCEDURE:  Higher-risk screening colonoscopy G0105  ASA CLASS:  Class II INDICATIONS:  family history of colon cancer SEVERE CONSTIPATION MEDICATIONS:   propofol (Diprivan) 350 mg IV  DESCRIPTION OF PROCEDURE:   After the risks and benefits and of the procedure were explained, informed consent was obtained. Digital rectal exam was performed and revealed no abnormalities. The LB CF-[id number] endoscope was introduced through the anus and advanced to the cecum, which was identified by both the appendix and ileocecal valve.  The quality of the prep was excellent, using MoviPrep.  The instrument was then slowly withdrawn as the colon was fully examined. <<PROCEDUREIMAGES>>  FINDINGS:  Melanosis coli was found.  No polyps or cancers were seen.  This was otherwise a normal examination of the colon. Retroflexed views in the rectum revealed no abnormalities.    The scope was then withdrawn from the patient and the procedure completed.  COMPLICATIONS:  None ENDOSCOPIC IMPRESSION: 1) Melanosis 2) No polyps or cancers 3) Otherwise normal examination RECOMMENDATIONS: TRIAL OF LINZESS 145 MCG/DAILY AND PRN MIRALAX  REPEAT EXAM:  No  ______________________________ Vania Rea. Jarold Motto, MD, [facility] [place]  CC:  n. eSIGNED:   Vania Rea. Prinston Kynard at 11/25/2011 03:39 PM  Osie Cheeks, [phone]

## 2011-11-25 NOTE — Progress Notes (Signed)
The pt tolerated the colonoscopy very well. Maw   

## 2011-11-25 NOTE — Patient Instructions (Addendum)

## 2011-11-26 ENCOUNTER — Telehealth: Payer: Self-pay

## 2011-11-26 NOTE — Telephone Encounter (Signed)
Left a message for the pt to call if she has any questions or concerns # [phone]. Maw

## 2011-11-27 ENCOUNTER — Telehealth: Payer: Self-pay | Admitting: Gastroenterology

## 2011-11-27 NOTE — Telephone Encounter (Signed)
Questions about colonoscopy and melanosis coli answered. She also had questions about linzess.  She will call back for any additional questions or concerns

## 2012-05-08 ENCOUNTER — Encounter: Payer: BC Managed Care – PPO | Admitting: Women's Health

## 2012-05-14 ENCOUNTER — Encounter: Payer: Self-pay | Admitting: Women's Health

## 2012-05-14 ENCOUNTER — Ambulatory Visit (INDEPENDENT_AMBULATORY_CARE_PROVIDER_SITE_OTHER): Payer: BC Managed Care – PPO | Admitting: Women's Health

## 2012-05-14 VITALS — BP 106/78 | Ht 69.0 in | Wt 145.0 lb

## 2012-05-14 DIAGNOSIS — Z01419 Encounter for gynecological examination (general) (routine) without abnormal findings: Secondary | ICD-10-CM

## 2012-05-14 DIAGNOSIS — K649 Unspecified hemorrhoids: Secondary | ICD-10-CM

## 2012-05-14 MED ORDER — HYDROCORTISONE ACE-PRAMOXINE 2.5-1 % RE CREA: TOPICAL_CREAM | Freq: Three times a day (TID) | RECTAL | Status: DC

## 2012-05-14 NOTE — Patient Instructions (Signed)

## 2012-05-14 NOTE — Progress Notes (Signed)
Pamela Welch 08-Mar-1977 [id number]    History:    The patient presents for annual exam.  Monthly 4-5 day cycle/lesbian/same partner for years. History of normal Paps and mammograms. Had a back injury several years ago that has caused some problems with depression due to pain and decreased mobility. Hypercholesteremia and asthma primary care manages labs and meds.  Past medical history, past surgical history, family history and social history were all reviewed and documented in the EPIC chart. CPA. Twin daughters Sheria Lang and Baron Hamper, [place], doing well, shared custody with ex partner. Sister breast cancer, bilateral mastectomy age 47 negative BRCA.   ROS:  A  ROS was performed and pertinent positives and negatives are included in the history.  Exam:  Filed Vitals:   05/14/12 0922  BP: 106/78    General appearance:  Normal Head/Neck:  Normal, without cervical or supraclavicular adenopathy. Thyroid:  Symmetrical, normal in size, without palpable masses or nodularity. Respiratory  Effort:  Normal  Auscultation:  Clear without wheezing or rhonchi Cardiovascular  Auscultation:  Regular rate, without rubs, murmurs or gallops  Edema/varicosities:  Not grossly evident Abdominal  Soft,nontender, without masses, guarding or rebound.  Liver/spleen:  No organomegaly noted  Hernia:  None appreciated  Skin  Inspection:  Grossly normal  Palpation:  Grossly normal Neurologic/psychiatric  Orientation:  Normal with appropriate conversation.  Mood/affect:  Normal  Genitourinary    Breasts: Examined lying and sitting.     Right: Without masses, retractions, discharge or axillary adenopathy.     Left: Without masses, retractions, discharge or axillary adenopathy.   Inguinal/mons:  Normal without inguinal adenopathy  External genitalia:  Normal  BUS/Urethra/Skene's glands:  Normal  Bladder:  Normal  Vagina:  Normal  Cervix:  Normal  Uterus:   normal in size, shape and contour.  Midline and  mobile  Adnexa/parametria:     Rt: Without masses or tenderness.   Lt: Without masses or tenderness.  Anus and perineum: Normal  Digital rectal exam: Normal sphincter tone without palpated masses or tenderness  Assessment/Plan:  46 y.o. S. WF G0 lesbian  for annual exam with no complaints.  Normal GYN exam Hypercholesteremia/asthma-primary care labs and meds Chronic back pain/disc-orthopedist  Plan: SBE's, continue annual mammogram, exercise, calcium rich diet, vitamin D 2000 daily encouraged. Pap normal 2012, new screening guidelines reviewed.    Harrington Challenger [facility], 12:54 PM 05/14/2012

## 2012-05-25 ENCOUNTER — Other Ambulatory Visit: Payer: Self-pay | Admitting: Women's Health

## 2012-05-31 ENCOUNTER — Other Ambulatory Visit: Payer: Self-pay | Admitting: Gastroenterology

## 2012-06-03 ENCOUNTER — Other Ambulatory Visit: Payer: Self-pay | Admitting: Gastroenterology

## 2012-06-15 ENCOUNTER — Encounter: Payer: Self-pay | Admitting: Women's Health

## 2012-06-15 ENCOUNTER — Ambulatory Visit (INDEPENDENT_AMBULATORY_CARE_PROVIDER_SITE_OTHER): Payer: BC Managed Care – PPO | Admitting: Women's Health

## 2012-06-15 DIAGNOSIS — K649 Unspecified hemorrhoids: Secondary | ICD-10-CM

## 2012-06-15 MED ORDER — LIDOCAINE-HYDROCORTISONE ACE 2.8-0.55 % RE GEL: RECTAL | Status: DC

## 2012-06-15 NOTE — Patient Instructions (Addendum)

## 2012-06-15 NOTE — Progress Notes (Signed)
Patient ID: Pamela Welch, female   DOB: Apr 19, 1966, 47 y.o.   MRN: [id number] Presents with the complaint of rectal pain from small hemorrhoid, better today. Has had long-term problems with constipation, negative colonoscopy, gastroenterologist prescribed Linzess and MiraLax, which has helped. Has been using Analpram HC most days since annual exam in December but continues with a small hemorrhoid. Denies vaginal discharge, urinary symptoms, fever, abdominal pain.  Exam: Appears well. External genitalia within normal limits, small, nonthrombosed 1 cm hemorrhoid slightly inflamed. No visible blood.  Hemorrhoid  Plan: Rectal gel HC twice daily, prescription, proper use given and reviewed. Stop Analpram. increase fluids, increase fiber rich foods in diet to prevent constipation. Instructed to call if rectal pain continues. Also reviewed using ProctoFoam without the steroid as needed also.

## 2012-08-28 ENCOUNTER — Other Ambulatory Visit (HOSPITAL_COMMUNITY): Payer: Self-pay | Admitting: Internal Medicine

## 2012-08-28 ENCOUNTER — Ambulatory Visit (HOSPITAL_COMMUNITY)
Admission: RE | Admit: 2012-08-28 | Discharge: 2012-08-28 | Disposition: A | Discharge status: Home / Self Care | Payer: BC Managed Care – PPO | Source: Ambulatory Visit | Attending: Internal Medicine | Admitting: Internal Medicine

## 2012-08-28 DIAGNOSIS — R0602 Shortness of breath: Secondary | ICD-10-CM | POA: Insufficient documentation

## 2012-08-28 DIAGNOSIS — R059 Cough, unspecified: Secondary | ICD-10-CM

## 2012-08-28 DIAGNOSIS — R05 Cough: Secondary | ICD-10-CM | POA: Insufficient documentation

## 2012-08-28 DIAGNOSIS — R079 Chest pain, unspecified: Secondary | ICD-10-CM | POA: Insufficient documentation

## 2012-08-28 DIAGNOSIS — R509 Fever, unspecified: Secondary | ICD-10-CM | POA: Insufficient documentation

## 2012-12-10 ENCOUNTER — Other Ambulatory Visit: Payer: Self-pay | Admitting: Gastroenterology

## 2012-12-11 NOTE — Telephone Encounter (Signed)
Patient will need an office visit for further refills  

## 2013-01-13 ENCOUNTER — Other Ambulatory Visit: Payer: Self-pay | Admitting: Gastroenterology

## 2013-01-14 NOTE — Telephone Encounter (Signed)
Please make an office visit for further refills  

## 2013-02-14 ENCOUNTER — Other Ambulatory Visit: Payer: Self-pay | Admitting: Gastroenterology

## 2013-03-13 ENCOUNTER — Other Ambulatory Visit: Payer: Self-pay | Admitting: Gastroenterology

## 2013-04-10 ENCOUNTER — Other Ambulatory Visit: Payer: Self-pay | Admitting: Internal Medicine

## 2013-04-11 ENCOUNTER — Other Ambulatory Visit: Payer: Self-pay | Admitting: Internal Medicine

## 2013-04-14 ENCOUNTER — Encounter: Payer: Self-pay | Admitting: Internal Medicine

## 2013-04-14 DIAGNOSIS — G4700 Insomnia, unspecified: Secondary | ICD-10-CM | POA: Insufficient documentation

## 2013-04-14 DIAGNOSIS — M545 Low back pain, unspecified: Secondary | ICD-10-CM | POA: Insufficient documentation

## 2013-04-14 DIAGNOSIS — IMO0002 Reserved for concepts with insufficient information to code with codable children: Secondary | ICD-10-CM | POA: Insufficient documentation

## 2013-04-14 DIAGNOSIS — B009 Herpesviral infection, unspecified: Secondary | ICD-10-CM | POA: Insufficient documentation

## 2013-04-15 ENCOUNTER — Other Ambulatory Visit: Payer: Self-pay | Admitting: Internal Medicine

## 2013-04-15 ENCOUNTER — Encounter: Payer: Self-pay | Admitting: Internal Medicine

## 2013-04-15 ENCOUNTER — Encounter: Payer: Self-pay | Admitting: Emergency Medicine

## 2013-04-15 ENCOUNTER — Ambulatory Visit: Payer: BC Managed Care – PPO | Admitting: Emergency Medicine

## 2013-04-15 VITALS — BP 110/80 | HR 68 | Temp 97.8°F | Resp 18 | Ht 69.0 in | Wt 151.0 lb

## 2013-04-15 DIAGNOSIS — Z0000 Encounter for general adult medical examination without abnormal findings: Secondary | ICD-10-CM

## 2013-04-15 LAB — CBC WITH DIFFERENTIAL/PLATELET
Basophils Absolute: 0.1 10*3/uL (ref 0.0–0.1)
Basophils Relative: 1 % (ref 0–1)
Eosinophils Relative: 2 % (ref 0–5)
HCT: 41.8 % (ref 36.0–46.0)
Lymphocytes Relative: 31 % (ref 12–46)
Lymphs Abs: 1.7 10*3/uL (ref 0.7–4.0)
MCH: 30.3 pg (ref 26.0–34.0)
MCHC: 34.2 g/dL (ref 30.0–36.0)
MCV: 88.6 fL (ref 78.0–100.0)
Neutro Abs: 3.3 10*3/uL (ref 1.7–7.7)
Platelets: 257 10*3/uL (ref 150–400)
RBC: 4.72 MIL/uL (ref 3.87–5.11)
WBC: 5.5 10*3/uL (ref 4.0–10.5)

## 2013-04-15 LAB — HEPATIC FUNCTION PANEL
ALT: 23 U/L (ref 0–35)
Albumin: 4.5 g/dL (ref 3.5–5.2)
Bilirubin, Direct: 0.1 mg/dL (ref 0.0–0.3)
Indirect Bilirubin: 0.3 mg/dL (ref 0.0–0.9)
Total Bilirubin: 0.4 mg/dL (ref 0.3–1.2)
Total Protein: 7 g/dL (ref 6.0–8.3)

## 2013-04-15 LAB — LIPID PANEL
Cholesterol: 168 mg/dL (ref 0–200)
HDL: 74 mg/dL (ref >39)
LDL Cholesterol: 71 mg/dL (ref 0–99)
Triglycerides: 117 mg/dL (ref <150)
VLDL: 23 mg/dL (ref 0–40)

## 2013-04-15 LAB — BASIC METABOLIC PANEL WITH GFR
CO2: 29 mEq/L (ref 19–32)
Calcium: 9.5 mg/dL (ref 8.4–10.5)
Creat: 1.13 mg/dL — ABNORMAL HIGH (ref 0.50–1.10)
GFR, Est African American: 66 mL/min
GFR, Est Non African American: 58 mL/min — ABNORMAL LOW
Potassium: 4.5 mEq/L (ref 3.5–5.3)
Sodium: 136 mEq/L (ref 135–145)

## 2013-04-15 NOTE — Patient Instructions (Signed)
Fat and Cholesterol Control Diet  Fat and cholesterol levels in your blood and organs are influenced by your diet. High levels of fat and cholesterol may lead to diseases of the heart, small and large blood vessels, gallbladder, liver, and pancreas.  CONTROLLING FAT AND CHOLESTEROL WITH DIET  Although exercise and lifestyle factors are important, your diet is key. That is because certain foods are known to raise cholesterol and others to lower it. The goal is to balance foods for their effect on cholesterol and more importantly, to replace saturated and trans fat with other types of fat, such as monounsaturated fat, polyunsaturated fat, and omega-3 fatty acids.  On average, a person should consume no more than 15 to 17 g of saturated fat daily. Saturated and trans fats are considered "bad" fats, and they will raise LDL cholesterol. Saturated fats are primarily found in animal products such as meats, butter, and cream. However, that does not mean you need to give up all your favorite foods. Today, there are good tasting, low-fat, low-cholesterol substitutes for most of the things you like to eat. Choose low-fat or nonfat alternatives. Choose round or loin cuts of red meat. These types of cuts are lowest in fat and cholesterol. Chicken (without the skin), fish, veal, and ground turkey breast are great choices. Eliminate fatty meats, such as hot dogs and salami. Even shellfish have little or no saturated fat. Have a 3 oz (85 g) portion when you eat lean meat, poultry, or fish.  Trans fats are also called "partially hydrogenated oils." They are oils that have been scientifically manipulated so that they are solid at room temperature resulting in a longer shelf life and improved taste and texture of foods in which they are added. Trans fats are found in stick margarine, some tub margarines, cookies, crackers, and baked goods.   When baking and cooking, oils are a great substitute for butter. The monounsaturated oils are  especially beneficial since it is believed they lower LDL and raise HDL. The oils you should avoid entirely are saturated tropical oils, such as coconut and palm.   Remember to eat a lot from food groups that are naturally free of saturated and trans fat, including fish, fruit, vegetables, beans, grains (barley, rice, couscous, bulgur wheat), and pasta (without cream sauces).   IDENTIFYING FOODS THAT LOWER FAT AND CHOLESTEROL   Soluble fiber may lower your cholesterol. This type of fiber is found in fruits such as apples, vegetables such as broccoli, potatoes, and carrots, legumes such as beans, peas, and lentils, and grains such as barley. Foods fortified with plant sterols (phytosterol) may also lower cholesterol. You should eat at least 2 g per day of these foods for a cholesterol lowering effect.   Read package labels to identify low-saturated fats, trans fat free, and low-fat foods at the supermarket. Select cheeses that have only 2 to 3 g saturated fat per ounce. Use a heart-healthy tub margarine that is free of trans fats or partially hydrogenated oil. When buying baked goods (cookies, crackers), avoid partially hydrogenated oils. Breads and muffins should be made from whole grains (whole-wheat or whole oat flour, instead of "flour" or "enriched flour"). Buy non-creamy canned soups with reduced salt and no added fats.   FOOD PREPARATION TECHNIQUES   Never deep-fry. If you must fry, either stir-fry, which uses very little fat, or use non-stick cooking sprays. When possible, broil, bake, or roast meats, and steam vegetables. Instead of putting butter or margarine on vegetables, use lemon   and herbs, applesauce, and cinnamon (for squash and sweet potatoes). Use nonfat yogurt, salsa, and low-fat dressings for salads.   LOW-SATURATED FAT / LOW-FAT FOOD SUBSTITUTES  Meats / Saturated Fat (g)  · Avoid: Steak, marbled (3 oz/85 g) / 11 g  · Choose: Steak, lean (3 oz/85 g) / 4 g  · Avoid: Hamburger (3 oz/85 g) / 7  g  · Choose: Hamburger, lean (3 oz/85 g) / 5 g  · Avoid: Ham (3 oz/85 g) / 6 g  · Choose: Ham, lean cut (3 oz/85 g) / 2.4 g  · Avoid: Chicken, with skin, dark meat (3 oz/85 g) / 4 g  · Choose: Chicken, skin removed, dark meat (3 oz/85 g) / 2 g  · Avoid: Chicken, with skin, light meat (3 oz/85 g) / 2.5 g  · Choose: Chicken, skin removed, light meat (3 oz/85 g) / 1 g  Dairy / Saturated Fat (g)  · Avoid: Whole milk (1 cup) / 5 g  · Choose: Low-fat milk, 2% (1 cup) / 3 g  · Choose: Low-fat milk, 1% (1 cup) / 1.5 g  · Choose: Skim milk (1 cup) / 0.3 g  · Avoid: Hard cheese (1 oz/28 g) / 6 g  · Choose: Skim milk cheese (1 oz/28 g) / 2 to 3 g  · Avoid: Cottage cheese, 4% fat (1 cup) / 6.5 g  · Choose: Low-fat cottage cheese, 1% fat (1 cup) / 1.5 g  · Avoid: Ice cream (1 cup) / 9 g  · Choose: Sherbet (1 cup) / 2.5 g  · Choose: Nonfat frozen yogurt (1 cup) / 0.3 g  · Choose: Frozen fruit bar / trace  · Avoid: Whipped cream (1 tbs) / 3.5 g  · Choose: Nondairy whipped topping (1 tbs) / 1 g  Condiments / Saturated Fat (g)  · Avoid: Mayonnaise (1 tbs) / 2 g  · Choose: Low-fat mayonnaise (1 tbs) / 1 g  · Avoid: Butter (1 tbs) / 7 g  · Choose: Extra light margarine (1 tbs) / 1 g  · Avoid: Coconut oil (1 tbs) / 11.8 g  · Choose: Olive oil (1 tbs) / 1.8 g  · Choose: Corn oil (1 tbs) / 1.7 g  · Choose: Safflower oil (1 tbs) / 1.2 g  · Choose: Sunflower oil (1 tbs) / 1.4 g  · Choose: Soybean oil (1 tbs) / 2.4 g  · Choose: Canola oil (1 tbs) / 1 g  Document Released: 05/13/2005 Document Revised: 09/07/2012 Document Reviewed: 11/01/2010  Exit[employer]® Patient Information ©2014 [employer].

## 2013-04-16 ENCOUNTER — Encounter: Payer: Self-pay | Admitting: Emergency Medicine

## 2013-04-16 LAB — URINALYSIS, ROUTINE W REFLEX MICROSCOPIC
Bilirubin Urine: NEGATIVE
Glucose, UA: NEGATIVE mg/dL
Leukocytes, UA: NEGATIVE
Protein, ur: NEGATIVE mg/dL
Specific Gravity, Urine: 1.02 (ref 1.005–1.030)
Urobilinogen, UA: 0.2 mg/dL (ref 0.0–1.0)

## 2013-04-16 LAB — MICROALBUMIN / CREATININE URINE RATIO
Microalb Creat Ratio: 3.8 mg/g (ref 0.0–30.0)
Microalb, Ur: 0.5 mg/dL (ref 0.00–1.89)

## 2013-04-16 LAB — TSH: TSH: 2.223 u[IU]/mL (ref 0.350–4.500)

## 2013-04-16 LAB — VITAMIN D 25 HYDROXY (VIT D DEFICIENCY, FRACTURES): Vit D, 25-Hydroxy: 63 ng/mL (ref 30–89)

## 2013-04-16 LAB — INSULIN, FASTING: Insulin fasting, serum: 7 u[IU]/mL (ref 3–28)

## 2013-04-16 NOTE — Progress Notes (Signed)
Subjective:    Patient ID: Pamela Welch, female    DOB: Apr 11, 1966, 47 y.o.   MRN: [id number]  HPI Comments: 47 yo WF presents for CPE and 3 month F/U for HTN, Cholesterol, D. Deficient. She has been doing great with eating. She has continued to increase activity level and maintain healthier weight. She has had significant improvement with back with regular exercise. She denies any anxiety or palpitations recently with Ativan. She recently saw ENT for cough and was diagnosed with allergic rhinitis and reflux. She has been improving significantly on NS, singulair, Tessalon perles and reflux hygiene. Over all no concerns or complaints.    Hyperlipidemia  Anxiety      Current Outpatient Prescriptions on File Prior to Visit  Medication Sig Dispense Refill  . aspirin 81 MG tablet Take 81 mg by mouth daily.        Marland Kitchen buPROPion (WELLBUTRIN XL) 300 MG 24 hr tablet TAKE 1 TABLET EVERY MORNING FOR MOOD  30 tablet  12  . Cholecalciferol (VITAMIN D3) 2000 UNITS TABS Take 1 tablet by mouth 3 (three) times daily.       . hydrocortisone-pramoxine (ANALPRAM-HC) 2.5-1 % rectal cream USE RECTALLY 3 TIMES A DAY  30 g  6  . Lidocaine-Hydrocortisone Ace 2.8-0.55 % GEL Use twice daily as needed  100 g  1  . LINZESS 145 MCG CAPS TAKE 1 CAPSULE BY MOUTH ONCE DAILY  30 capsule  6  . Magnesium 400 MG CAPS Take 1 capsule by mouth 2 (two) times daily.        . montelukast (SINGULAIR) 10 MG tablet       . Multiple Vitamin (MULTIVITAMIN) tablet Take 1 tablet by mouth daily.        . Omega-3 Fatty Acids (FISH OIL) 1200 MG CAPS Take by mouth. Take about 2-3 times a week       . OVER THE COUNTER MEDICATION cvs brand of stool softner, every night      . OVER THE COUNTER MEDICATION cvs brand bulking fiber use prn      . polyethylene glycol (MIRALAX / GLYCOLAX) packet Take 17 g by mouth daily.      . rosuvastatin (CRESTOR) 40 MG tablet Take 20 mg by mouth daily.       . trazodone (DESYREL) 300 MG tablet TAKE 1/2 TO 1  TABLET EVERY DAY AT BEDTIME AS NEEDED FOR SLEEP  90 tablet  4   No current facility-administered medications on file prior to visit.   ALLERGIES Ambien; Atenolol; Cholestatin; Mirapex; and Requip  Past Medical History  Diagnosis Date  . Chest pain, unspecified     normal myoview 2000;  echo 5/10:  mild LVH, EF 60-65%  . Palpitations     h/o symptomatic PVCs  . Restless legs syndrome (RLS)   . Obstructive sleep apnea (adult) (pediatric)   . Persistent disorder of initiating or maintaining sleep   . Anxiety   . Herniated disc   . Depression   . Hypertension   . Hyperlipidemia   . Back injury   . Melanosis   . Lumbago   . Insomnia   . HSV-1 (herpes simplex virus 1) infection   . DDD (degenerative disc disease)   . Palpitations     Past Surgical History  Procedure Laterality Date  . Tubes in ears      as an adult  . Refractive surgery      bilateral eyes   . Eye surgery  2001  History   Social History  . Marital Status: Single    Spouse Name: N/A    Number of Children: 2  . Years of Education: N/A   Occupational History  . cpa    Social History Main Topics  . Smoking status: Never Smoker   . Smokeless tobacco: Never Used  . Alcohol Use: Yes     Comment: ocas  . Drug Use: No  . Sexual Activity: Yes    Birth Control/ Protection: None   Other Topics Concern  . None   Social History Narrative  . None    Family History  Problem Relation Age of Onset  . Hypertension Mother   . Osteoporosis Mother   . Breast cancer Sister 13    BRCA NEG/BILAT MASTECTOMY/CHEMO X 1 YR  . Hyperlipidemia Sister   . Cancer Sister     breast/ ovary  . Ovarian cancer Neg Hx   . Colon cancer Maternal Grandmother   . Stroke Maternal Grandmother   . Osteoporosis Maternal Grandmother   . Cancer Maternal Grandmother     MGGM colon  . Colon cancer Maternal Grandfather   . Cancer Maternal Grandfather     colon  . Cancer Paternal Grandmother     multiple myeloma  .  Diabetes Paternal Grandfather   . Hypertension Father   . Hyperlipidemia Father   . Heart disease Cousin   . Cancer Cousin     lung     Review of Systems  HENT: Positive for postnasal drip.        [facility] [place] [place] recent visit  Eyes:       [place] 12/14  Respiratory: Positive for cough.   Gastrointestinal:       [address] 1/15  Genitourinary:       GYN- nancy young 12/14  Musculoskeletal: Positive for back pain.       Improved and maintaining Murphy/ Thurston Hole PRN    Skin:       Danella Deis- yearly, due currently   Allergic/Immunologic:       Lebaurer PRN  Neurological:       Noodleman PRN  All other systems reviewed and are negative.    BP 110/80  Pulse 68  Temp(Src) 97.8 F (36.6 C) (Temporal)  Resp 18  Ht 5\' 9"  (1.753 m)  Wt 151 lb (68.493 kg)  BMI 22.29 kg/m2  LMP 04/15/2013     Objective:   Physical Exam  Nursing note and vitals reviewed. Constitutional: She is oriented to person, place, and time. She appears well-developed and well-nourished.  HENT:  Head: Normocephalic and atraumatic.  Right Ear: External ear normal.  Left Ear: External ear normal.  Nose: Nose normal.  Mouth/Throat: Oropharynx is clear and moist.  Eyes: Conjunctivae and EOM are normal. Pupils are equal, round, and reactive to light.  Neck: Normal range of motion. Neck supple. No JVD present. No tracheal deviation present. No thyromegaly present.  Cardiovascular: Normal rate, regular rhythm, normal heart sounds and intact distal pulses.   Pulmonary/Chest: Effort normal and breath sounds normal.  Abdominal: Soft. Bowel sounds are normal. She exhibits no distension and no mass. There is no tenderness. There is no rebound and no guarding.  Genitourinary:  Def GYN  Musculoskeletal: Normal range of motion.  Lymphadenopathy:    She has no cervical adenopathy.  Neurological: She is alert and oriented to person, place, and time. She has normal reflexes. No cranial nerve deficit. Coordination normal.   Skin: Skin is warm and dry. No  rash noted. No erythema. No pallor.  Right mid abdomen and mid chest flat dark irreg  2 -4mm  Psychiatric: She has a normal mood and affect. Her behavior is normal. Judgment and thought content normal.          Assessment & Plan:  1. CPE and 3 month F/U for HTN, Cholesterol, D. Deficient, Doing well overall. Check labs 2. Irreg Nevi she w/c for derm appointment for removal.

## 2013-04-19 LAB — TB SKIN TEST
Induration: 0 mm
TB Skin Test: NEGATIVE

## 2013-05-04 ENCOUNTER — Telehealth: Payer: Self-pay | Admitting: Emergency Medicine

## 2013-05-04 ENCOUNTER — Other Ambulatory Visit: Payer: Self-pay | Admitting: Emergency Medicine

## 2013-05-04 DIAGNOSIS — K219 Gastro-esophageal reflux disease without esophagitis: Secondary | ICD-10-CM

## 2013-05-04 NOTE — Telephone Encounter (Signed)
Pt called said she had a cpe with Efraim Kaufmann a few wks ago and yall talked about her cough She is still having the cough=alot at nite=and when she start talking to people she start cough and stomach acid comes up She thinks she needs appt with a gi doctor. She said you told her to call back if it didn't go away Please call pt back at [phone] Sending chart back

## 2013-05-04 NOTE — Telephone Encounter (Signed)
Please let her know we will get her a referral to GI. We will call her once we get appointment scheduled.

## 2013-05-05 ENCOUNTER — Encounter: Payer: Self-pay | Admitting: *Deleted

## 2013-05-06 ENCOUNTER — Other Ambulatory Visit: Payer: Self-pay | Admitting: Internal Medicine

## 2013-05-06 ENCOUNTER — Other Ambulatory Visit: Payer: Self-pay | Admitting: Gastroenterology

## 2013-05-07 ENCOUNTER — Encounter: Payer: Self-pay | Admitting: Internal Medicine

## 2013-05-11 ENCOUNTER — Encounter: Payer: Self-pay | Admitting: Gastroenterology

## 2013-05-11 ENCOUNTER — Ambulatory Visit (INDEPENDENT_AMBULATORY_CARE_PROVIDER_SITE_OTHER)
Admission: RE | Admit: 2013-05-11 | Discharge: 2013-05-11 | Disposition: A | Discharge status: Home / Self Care | Payer: BC Managed Care – PPO | Source: Ambulatory Visit | Attending: Gastroenterology | Admitting: Gastroenterology

## 2013-05-11 ENCOUNTER — Ambulatory Visit (INDEPENDENT_AMBULATORY_CARE_PROVIDER_SITE_OTHER): Payer: BC Managed Care – PPO | Admitting: Gastroenterology

## 2013-05-11 VITALS — BP 120/76 | HR 91 | Ht 69.0 in | Wt 150.0 lb

## 2013-05-11 DIAGNOSIS — R05 Cough: Secondary | ICD-10-CM

## 2013-05-11 DIAGNOSIS — R059 Cough, unspecified: Secondary | ICD-10-CM

## 2013-05-11 DIAGNOSIS — F458 Other somatoform disorders: Secondary | ICD-10-CM

## 2013-05-11 DIAGNOSIS — K219 Gastro-esophageal reflux disease without esophagitis: Secondary | ICD-10-CM

## 2013-05-11 DIAGNOSIS — R198 Other specified symptoms and signs involving the digestive system and abdomen: Secondary | ICD-10-CM

## 2013-05-11 DIAGNOSIS — R0989 Other specified symptoms and signs involving the circulatory and respiratory systems: Secondary | ICD-10-CM

## 2013-05-11 MED ORDER — LINACLOTIDE 145 MCG PO CAPS: 145.0000 ug | ORAL_CAPSULE | Freq: Every day | ORAL | Status: DC

## 2013-05-11 MED ORDER — DEXLANSOPRAZOLE 60 MG PO CPDR: 60.0000 mg | DELAYED_RELEASE_CAPSULE | Freq: Every day | ORAL | Status: DC

## 2013-05-11 NOTE — Patient Instructions (Signed)
Please go to the basement to the X-Ray Department and have your Chest X-Ray done.  You have been scheduled for an endoscopy with propofol. Please follow written instructions given to you at your visit today. If you use inhalers (even only as needed), please bring them with you on the day of your procedure. Your physician has requested that you go to www.startemmi.com and enter the access code given to you at your visit today. This web site gives a general overview about your procedure. However, you should still follow specific instructions given to you by our office regarding your preparation for the procedure.  We have given you samples of the following medication to take: Linzess 145 mcg, please take one capsule by mouth once daily on an empty stomach Dexilant 60 mg, please take one capsule by mouth once daily thirty minuets before breakfast  You watched a video today on acid reflux and information is provided below for you to read.  ____________________________________________________________________________________________________________________  Gastroesophageal Reflux Disease, Adult Gastroesophageal reflux disease (GERD) happens when acid from your stomach flows up into the esophagus. When acid comes in contact with the esophagus, the acid causes soreness (inflammation) in the esophagus. Over time, GERD may create small holes (ulcers) in the lining of the esophagus. CAUSES   Increased body weight. This puts pressure on the stomach, making acid rise from the stomach into the esophagus.  Smoking. This increases acid production in the stomach.  Drinking alcohol. This causes decreased pressure in the lower esophageal sphincter (valve or ring of muscle between the esophagus and stomach), allowing acid from the stomach into the esophagus.  Late evening meals and a full stomach. This increases pressure and acid production in the stomach.  A malformed lower esophageal sphincter. Sometimes,  no cause is found. SYMPTOMS   Burning pain in the lower part of the mid-chest behind the breastbone and in the mid-stomach area. This may occur twice a week or more often.  Trouble swallowing.  Sore throat.  Dry cough.  Asthma-like symptoms including chest tightness, shortness of breath, or wheezing. DIAGNOSIS  Your caregiver may be able to diagnose GERD based on your symptoms. In some cases, X-rays and other tests may be done to check for complications or to check the condition of your stomach and esophagus. TREATMENT  Your caregiver may recommend over-the-counter or prescription medicines to help decrease acid production. Ask your caregiver before starting or adding any new medicines.  HOME CARE INSTRUCTIONS   Change the factors that you can control. Ask your caregiver for guidance concerning weight loss, quitting smoking, and alcohol consumption.  Avoid foods and drinks that make your symptoms worse, such as:  Caffeine or alcoholic drinks.  Chocolate.  Peppermint or mint flavorings.  Garlic and onions.  Spicy foods.  Citrus fruits, such as oranges, lemons, or limes.  Tomato-based foods such as sauce, chili, salsa, and pizza.  Fried and fatty foods.  Avoid lying down for the 3 hours prior to your bedtime or prior to taking a nap.  Eat small, frequent meals instead of large meals.  Wear loose-fitting clothing. Do not wear anything tight around your waist that causes pressure on your stomach.  Raise the head of your bed 6 to 8 inches with wood blocks to help you sleep. Extra pillows will not help.  Only take over-the-counter or prescription medicines for pain, discomfort, or fever as directed by your caregiver.  Do not take aspirin, ibuprofen, or other nonsteroidal anti-inflammatory drugs (NSAIDs). SEEK IMMEDIATE MEDICAL CARE  IF:   You have pain in your arms, neck, jaw, teeth, or back.  Your pain increases or changes in intensity or duration.  You develop  nausea, vomiting, or sweating (diaphoresis).  You develop shortness of breath, or you faint.  Your vomit is green, yellow, black, or looks like coffee grounds or blood.  Your stool is red, bloody, or black. These symptoms could be signs of other problems, such as heart disease, gastric bleeding, or esophageal bleeding. MAKE SURE YOU:   Understand these instructions.  Will watch your condition.  Will get help right away if you are not doing well or get worse. Document Released: 02/20/2005 Document Revised: 08/05/2011 Document Reviewed: 11/30/2010 [facility] Patient Information 2014 [place], [place].

## 2013-05-11 NOTE — Progress Notes (Signed)
This is a 47 year old Caucasian female with constipation predominant IBS.  She is doing well on daily lens [place] on and 45 mcg.  Her office visit today centers around a two-year history of a globus sensation with coughing, throat clearing, and nocturnal awakening.  She's been seen by ENT and apparently told that she had postnasal drip.  She's not had a chest x-ray her pulmonary evaluation.  She denies typical acid reflux symptoms of regurgitation or burning substernal chest pain, but does have a globus sensation in her upper throat area.  She's had no true dysphagia.  Her appetite is good her weight is stable.  She has had some problems swallowing large vitamin pills.  There is no history of allergic related illnesses or Raynaud's phenomenon.  Current Medications, Allergies, Past Medical History, Past Surgical History, Family History and Social History were reviewed in [employer] record.  ROS: All systems were reviewed and are negative unless otherwise stated in the HPI.          Physical Exam: At pressure 120/76, pulse 91 and regular and weight 150 pounds.  Examination oral pharyngeal area is unremarkable.  There is no thyromegaly or lymphadenopathy.  Chest is entirely clear she is in a regular rhythm without murmurs gallops or rubs.  I cannot appreciate hepatosplenomegaly, abdominal masses or tenderness.  Peripheral extremities are unremarkable.  Mental status is normal    Assessment and Plan: Probable extra esophageal manifestations of acid reflux.  I've scheduled her for chest x-ray endoscopic exam.  We have reviewed acid reflux regime maneuvers, she saw our patient education video on acid reflux management, and I've started Dexilant 60 mg before her evening meal since most of her symptoms are nocturnal.  She is not eat within 2 hours of bedtime and elevate the head of her bed at night.  I did remove her Linzess  145 mcg per day prescription for her constipation predominant  IBS.  She may need a high-resolution esophageal manometry.

## 2013-05-12 ENCOUNTER — Encounter: Payer: Self-pay | Admitting: Gastroenterology

## 2013-05-14 ENCOUNTER — Telehealth: Payer: Self-pay | Admitting: *Deleted

## 2013-05-14 ENCOUNTER — Telehealth: Payer: Self-pay | Admitting: Gastroenterology

## 2013-05-14 MED ORDER — LINACLOTIDE 145 MCG PO CAPS: 145.0000 ug | ORAL_CAPSULE | Freq: Every day | ORAL | Status: DC

## 2013-05-14 MED ORDER — DEXLANSOPRAZOLE 60 MG PO CPDR: 60.0000 mg | DELAYED_RELEASE_CAPSULE | Freq: Every day | ORAL | Status: DC

## 2013-05-14 NOTE — Telephone Encounter (Signed)
Patient called back and stated that the pharmacy([facility]) did not get RX for Dexilant or Linzess. I advised patient I will call [facility] and call her back. Patient verbalized understanding. I [facility] and per Zella Ball RX's are there, prior auth needed for Dexilant.  I called patient to advised her and ask what anti-acid medication she has tried because prior [place] department will ask and patient has tried Omeprazole. Prior Auth number is [id number]. I called and spoke with Yolanda. I was transferred to [phone]([facility])and spoke with Santina Evans. Per Santina Evans patient is not in there system and transferred me to member services. [phone]([facility])--member services. I spoke with Inetta Fermo at member services and per Inetta Fermo patient has had not had active coverage since 2012. I called [phone] back and spoke with Susy Frizzle explained that first rep gave me wrong number. Susy Frizzle gave me [phone] to call and said push Option 3 then Option 1. I spoke with Ashley Mariner and per Adelina Mings she will fax over form for prior auth and I need to fax and send back. I called patient and advised I have to do prior auth and I will leave samples up front to get her through.

## 2013-05-14 NOTE — Telephone Encounter (Signed)
LMOM for patient to call back.

## 2013-05-17 ENCOUNTER — Encounter: Payer: Self-pay | Admitting: Gastroenterology

## 2013-05-17 ENCOUNTER — Ambulatory Visit (AMBULATORY_SURGERY_CENTER): Payer: BC Managed Care – PPO | Admitting: Gastroenterology

## 2013-05-17 VITALS — BP 123/84 | HR 62 | Temp 97.4°F | Resp 22 | Ht 69.0 in | Wt 150.0 lb

## 2013-05-17 DIAGNOSIS — R079 Chest pain, unspecified: Secondary | ICD-10-CM

## 2013-05-17 DIAGNOSIS — K2270 Barrett's esophagus without dysplasia: Secondary | ICD-10-CM

## 2013-05-17 DIAGNOSIS — R140 Abdominal distension (gaseous): Secondary | ICD-10-CM

## 2013-05-17 DIAGNOSIS — R05 Cough: Secondary | ICD-10-CM

## 2013-05-17 DIAGNOSIS — K219 Gastro-esophageal reflux disease without esophagitis: Secondary | ICD-10-CM

## 2013-05-17 DIAGNOSIS — R059 Cough, unspecified: Secondary | ICD-10-CM

## 2013-05-17 MED ORDER — SODIUM CHLORIDE 0.9 % IV SOLN: 500.0000 mL | INTRAVENOUS | Status: DC

## 2013-05-17 NOTE — Patient Instructions (Signed)
YOU HAD AN ENDOSCOPIC PROCEDURE TODAY AT THE [facility]: Refer to the procedure report that was given to you for any specific questions about what was found during the examination.  If the procedure report does not answer your questions, please call your gastroenterologist to clarify.  If you requested that your care partner not be given the details of your procedure findings, then the procedure report has been included in a sealed envelope for you to review at your convenience later.  YOU SHOULD EXPECT: Some feelings of bloating in the abdomen. Passage of more gas than usual.  Walking can help get rid of the air that was put into your GI tract during the procedure and reduce the bloating.   DIET: Your first meal following the procedure should be a light meal and then it is ok to progress to your normal diet.  A half-sandwich or bowl of soup is an example of a good first meal.  Heavy or fried foods are harder to digest and may make you feel nauseous or bloated.  Likewise meals heavy in dairy and vegetables can cause extra gas to form and this can also increase the bloating.  Drink plenty of fluids but you should avoid alcoholic beverages for 24 hours.  ACTIVITY: Your care partner should take you home directly after the procedure.  You should plan to take it easy, moving slowly for the rest of the day.  You can resume normal activity the day after the procedure however you should NOT DRIVE or use heavy machinery for 24 hours (because of the sedation medicines used during the test).    SYMPTOMS TO REPORT IMMEDIATELY: A gastroenterologist can be reached at any hour.  During normal business hours, 8:30 AM to 5:00 PM Monday through Friday, call [phone].  After hours and on weekends, please call the GI answering service at [phone] who will take a message and have the physician on call contact you.     Vomiting of blood or coffee ground material  New chest pain or pain under the  shoulder blades  Painful or persistently difficult swallowing  New shortness of breath  Fever of 100F or higher  Black, tarry-looking stools  FOLLOW UP: If any biopsies were taken you will be contacted by phone or by letter within the next 1-3 weeks.  Call your gastroenterologist if you have not heard about the biopsies in 3 weeks.  Our staff will call the home number listed on your records the next business day following your procedure to check on you and address any questions or concerns that you may have at that time regarding the information given to you following your procedure. This is a courtesy call and so if there is no answer at the home number and we have not heard from you through the emergency physician on call, we will assume that you have returned to your regular daily activities without incident.  SIGNATURES/CONFIDENTIALITY: You and/or your care partner have signed paperwork which will be entered into your electronic medical record.  These signatures attest to the fact that that the information above on your After Visit Summary has been reviewed and is understood.  Full responsibility of the confidentiality of this discharge information lies with you and/or your care-partner.  Continue dexilant as directed daily-take this 30 minutes before the first meal of the day  Call the office in the next  1-3 days at [phone] to schedule an office visit for 1 month with dr  patterson

## 2013-05-17 NOTE — Progress Notes (Signed)
Called to room to assist after endoscopic procedure.  Patient ID and intended procedure confirmed with present staff. Received instructions for my participation in the procedure from the performing endo. Tech. ,Darlene.

## 2013-05-17 NOTE — Op Note (Signed)
[facility] [address] [place], [address]   ENDOSCOPY PROCEDURE REPORT  PATIENT: Pamela Welch, Pamela Welch  MR#: [id number] BIRTHDATE: 1965-05-29 , 47  yrs. old GENDER: Female ENDOSCOPIST:Eryck Negron Hale Bogus, MD, [facility] REFERRED BY: PROCEDURE DATE:  05/17/2013 PROCEDURE:   EGD w/ biopsy ASA CLASS:    Class II INDICATIONS: globus sensation. MEDICATION: propofol (Diprivan) 200mg  IV TOPICAL ANESTHETIC:   Cetacaine Spray  DESCRIPTION OF PROCEDURE:   After the risks and benefits of the procedure were explained, informed consent was obtained.  The LB [id number]  endoscope was introduced through the mouth  and advanced to the second portion of the duodenum .  The instrument was slowly withdrawn as the mucosa was fully examined.      ESOPHAGUS: There was evidence of suspected Barrett's esophagus in the lower third of the esophagus.  Multiple biopsies were performed.  See pictires. The vocal cords and hypopharynx appears normal.No voval cord edema,redness,nodules,etc.  STOMACH: The mucosa of the stomach appeared normal.  DUODENUM: The duodenal mucosa showed no abnormalities in the bulb and second portion of the duodenum.    Retroflexed views revealed a moderate sized hiatal hernia.    The scope was then withdrawn from the patient and the procedure completed.  COMPLICATIONS: There were no complications.   ENDOSCOPIC IMPRESSION: 1.   There was evidence of suspected Barrett's esophagus; multiple biopsies done...moderate sized HH noted,all c/w chronic GERD. 2.   The mucosa of the stomach appeared normal 3.   The duodenal mucosa showed no abnormalities in the bulb and second portion of the duodenum  RECOMMENDATIONS: 1.  Await biopsy results 2.  Continue PPI 3.  Call office next 2-3 days to schedule an office appointment for 1 month    _______________________________ eSigned:  Mardella Layman, MD, [facility] 05/17/2013 3:21 PM   antireflux   PATIENT NAME:   Dariel, Pellecchia MR#: [id number]

## 2013-05-17 NOTE — Progress Notes (Signed)
Report to pacu rn, vss, bbs=clear 

## 2013-05-18 ENCOUNTER — Other Ambulatory Visit (HOSPITAL_COMMUNITY)
Admission: RE | Admit: 2013-05-18 | Discharge: 2013-05-18 | Disposition: A | Discharge status: Home / Self Care | Payer: BC Managed Care – PPO | Source: Ambulatory Visit | Attending: Gynecology | Admitting: Gynecology

## 2013-05-18 ENCOUNTER — Telehealth: Payer: Self-pay | Admitting: *Deleted

## 2013-05-18 ENCOUNTER — Other Ambulatory Visit: Payer: Self-pay | Admitting: *Deleted

## 2013-05-18 ENCOUNTER — Ambulatory Visit (INDEPENDENT_AMBULATORY_CARE_PROVIDER_SITE_OTHER): Payer: BC Managed Care – PPO | Admitting: Women's Health

## 2013-05-18 ENCOUNTER — Encounter: Payer: Self-pay | Admitting: Women's Health

## 2013-05-18 VITALS — BP 120/68 | Ht 69.0 in | Wt 155.0 lb

## 2013-05-18 DIAGNOSIS — Z01419 Encounter for gynecological examination (general) (routine) without abnormal findings: Secondary | ICD-10-CM

## 2013-05-18 NOTE — Patient Instructions (Signed)

## 2013-05-18 NOTE — Progress Notes (Signed)
Pamela Welch Jan 22, 1966 [id number]    History:    The patient presents for annual exam.  Monthly cycle/lesbian/same partner. Normal Pap and mammogram history. Hypercholesterolemia and asthma managed by primary care. Endoscopy yesterday with probable Barrett's esophagitis. Sister breast cancer, bilateral mastectomies with negative BRCA.   Past medical history, past surgical history, family history and social history were all reviewed and documented in the EPIC chart. CPA. Twins Sheria Lang and Baron Hamper 16 doing well/joint custody. Negative colonoscopy 11/2011.   ROS:  A  ROS was performed and pertinent positives and negatives are included in the history.  Exam:  Filed Vitals:   05/18/13 1057  BP: 120/68    General appearance:  Normal Head/Neck:  Normal, without cervical or supraclavicular adenopathy. Thyroid:  Symmetrical, normal in size, without palpable masses or nodularity. Respiratory  Effort:  Normal  Auscultation:  Clear without wheezing or rhonchi Cardiovascular  Auscultation:  Regular rate, without rubs, murmurs or gallops  Edema/varicosities:  Not grossly evident Abdominal  Soft,nontender, without masses, guarding or rebound.  Liver/spleen:  No organomegaly noted  Hernia:  None appreciated  Skin  Inspection:  Grossly normal  Palpation:  Grossly normal Neurologic/psychiatric  Orientation:  Normal with appropriate conversation.  Mood/affect:  Normal  Genitourinary    Breasts: Examined lying and sitting.     Right: Without masses, retractions, discharge or axillary adenopathy.     Left: Without masses, retractions, discharge or axillary adenopathy.   Inguinal/mons:  Normal without inguinal adenopathy  External genitalia:  Normal  BUS/Urethra/Skene's glands:  Normal  Bladder:  Normal  Vagina:  Normal  Cervix:  Normal  Uterus:   normal in size, shape and contour.  Midline and mobile  Adnexa/parametria:     Rt: Without masses or tenderness.   Lt: Without masses or  tenderness.  Anus and perineum: Normal  Digital rectal exam: Normal sphincter tone without palpated masses or tenderness  Assessment/Plan:  47 y.o. SWF G0 for annual exam.     Probable Barrett's esophagus/Dr. Jarold Motto manages/has followup scheduled Normal GYN exam Hypercholesterolemia/asthma-primary care manages labs  Plan: SBE's, continue annual mammogram, 3D tomography reviewed and encouraged. Continue exercise, calcium rich diet, vitamin D 1000 daily encouraged. Pap. Pap normal 2012, new screening guidelines reviewed.    Harrington Ch[facility], 12:14 PM 05/18/2013

## 2013-05-18 NOTE — Telephone Encounter (Signed)
LM on number given in admitting yesterday to return call if problems questions concerns. ewm

## 2013-05-24 ENCOUNTER — Telehealth: Payer: Self-pay | Admitting: Gastroenterology

## 2013-05-24 NOTE — Telephone Encounter (Signed)
Informed pt she has Barrett's Esophagus and she should receive a letter when Dr Jarold Motto returns for her next step; repeat EGD, meds, etc. Pt stated understanding.

## 2013-05-28 ENCOUNTER — Encounter: Payer: Self-pay | Admitting: Gastroenterology

## 2013-05-31 ENCOUNTER — Encounter: Payer: BC Managed Care – PPO | Admitting: Gastroenterology

## 2013-06-09 ENCOUNTER — Other Ambulatory Visit: Payer: Self-pay | Admitting: Radiology

## 2013-06-15 ENCOUNTER — Encounter: Payer: Self-pay | Admitting: Gastroenterology

## 2013-06-15 ENCOUNTER — Ambulatory Visit (INDEPENDENT_AMBULATORY_CARE_PROVIDER_SITE_OTHER): Payer: BC Managed Care – PPO | Admitting: Gastroenterology

## 2013-06-15 VITALS — BP 104/70 | HR 89 | Ht 69.0 in | Wt 156.5 lb

## 2013-06-15 DIAGNOSIS — K2270 Barrett's esophagus without dysplasia: Secondary | ICD-10-CM

## 2013-06-15 DIAGNOSIS — K5900 Constipation, unspecified: Secondary | ICD-10-CM

## 2013-06-15 DIAGNOSIS — K219 Gastro-esophageal reflux disease without esophagitis: Secondary | ICD-10-CM

## 2013-06-15 MED ORDER — DEXLANSOPRAZOLE 60 MG PO CPDR: 60.0000 mg | DELAYED_RELEASE_CAPSULE | Freq: Every day | ORAL | Status: DC

## 2013-06-15 MED ORDER — LINACLOTIDE 145 MCG PO CAPS: 145.0000 ug | ORAL_CAPSULE | Freq: Every day | ORAL | Status: DC

## 2013-06-15 NOTE — Progress Notes (Signed)
History of Present Illness: This is a very nice 48 year old Caucasian female who had coughing, hoarseness, throat clearing for several years.  Recent endoscopy which showed Barrett's mucosa in her distal esophagus, and she currently is 90% improved on daily Dexilant.  She denies typical acid reflux symptoms of regurgitation and burning substernal chest pain, and also denies any lower GI or hepatobiliary complaints.  He additionally has chronic functional constipation and takes Linzess 145 mcg a day.  She denies melena or hematochezia.  She is on multiple other medications listed and reviewed her chart..    Current Medications, Allergies, Past Medical History, Past Surgical History, Family History and Social History were reviewed in [employer] record.  ROS: All systems were reviewed and are negative unless otherwise stated in the HPI.         Assessment and plan: Long discussion with the patient today concerning acid reflux, is management, and Barrett's mucosa.  She will need followup endoscopy with dysplasia biopsies in 2 years.  I've set her up an appointment to see Dr. Hilarie Fredrickson in one years time for establishment of care since I am retiring.  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  I reviewed in acid reflux regime with the patient and have urged her to take her Dexilant 30 minutes before breakfast in the morning religiously.  Also I gave her printed information concerning Barrett's mucosa, is etiology, management, and clinical course.  She is to call if she has a relapse of her acid reflux atypical symptoms.

## 2013-06-15 NOTE — Patient Instructions (Addendum)
Please follow up with Dr. Hilarie Fredrickson in one year  Refill of Linzess and Dexilant was sent to your pharmacy  _____________________________________________________________________  Barrett's Esophagus Barrett's esophagus occurs when the lining of the esophagus is damaged. The esophagus is the tube that carries food from the mouth to the stomach. With Barrett's esophagus, the lining of the esophagus gets replaced by material that is similar to the lining in the intestines. This process is called intestinal metaplasia. A small number of people with Barrett's esophagus develop esophageal cancer. CAUSES  The exact cause of Barrett's esophagus is unknown. SYMPTOMS  Most people with Barrett's esophagus do not have symptoms. However, many patients also have gastroesophageal reflux disease (GERD). GERD can cause heartburn, trouble swallowing, and a dry cough. DIAGNOSIS Barrett's esophagus is diagnosed by an exam called upper gastrointestinal endoscopy. A thin, flexible tube (endoscope) is passed down the esophagus. The endoscope has a light and camera on the end. Your caregiver uses the endoscope to view the inside of the esophagus. A tissue sample may also be taken and examined under a microscope (biopsy). If cancer cells are found during the biopsy, this condition is called dysplasia. TREATMENT  If you have no dysplasia or low-grade dysplasia, your caregiver may recommend no treatment or only taking medicines to treat GERD. Sometimes, taking acid-blocking drugs to treat GERD helps improve the tissue affected by Barrett's esophagus. Your caregiver may also recommend periodic esophageal exams. If you have high-grade dysplasia, treatment may include removing the damaged parts of the esophagus. This can be done by heating, freezing, or surgically removing the tissue. In some cases, surgery may be done to remove most of the esophagus. The stomach is then attached to the remaining portion of the esophagus. HOME CARE  INSTRUCTIONS  Take acid-blocking drugs for GERD if recommended by your caregiver.  Keep all follow-up appointments as directed by your caregiver. You may need periodic esophageal exams. SEEK IMMEDIATE MEDICAL CARE IF:  You have chest pain.  You have trouble swallowing.  You vomit blood or material that looks like coffee grounds.  Your stools are bright red or dark. Document Released: 08/03/2003 Document Revised: 11/12/2011 Document Reviewed: 07/23/2011 [facility] Patient Information 2014 [place].

## 2013-06-18 ENCOUNTER — Ambulatory Visit: Payer: BC Managed Care – PPO | Admitting: Gastroenterology

## 2013-07-12 ENCOUNTER — Other Ambulatory Visit: Payer: Self-pay | Admitting: Emergency Medicine

## 2013-07-12 MED ORDER — LORAZEPAM 2 MG PO TABS: ORAL_TABLET | ORAL | Status: DC

## 2013-08-06 ENCOUNTER — Other Ambulatory Visit: Payer: Self-pay | Admitting: Women's Health

## 2013-09-05 ENCOUNTER — Other Ambulatory Visit: Payer: Self-pay | Admitting: Emergency Medicine

## 2013-09-13 ENCOUNTER — Ambulatory Visit (INDEPENDENT_AMBULATORY_CARE_PROVIDER_SITE_OTHER): Payer: BC Managed Care – PPO | Admitting: Emergency Medicine

## 2013-09-13 ENCOUNTER — Encounter: Payer: Self-pay | Admitting: Emergency Medicine

## 2013-09-13 VITALS — BP 104/70 | HR 94 | Temp 98.2°F | Resp 16 | Ht 69.0 in | Wt 154.0 lb

## 2013-09-13 DIAGNOSIS — R5381 Other malaise: Secondary | ICD-10-CM

## 2013-09-13 DIAGNOSIS — R5383 Other fatigue: Secondary | ICD-10-CM

## 2013-09-13 DIAGNOSIS — E782 Mixed hyperlipidemia: Secondary | ICD-10-CM

## 2013-09-13 DIAGNOSIS — I10 Essential (primary) hypertension: Secondary | ICD-10-CM

## 2013-09-13 LAB — BASIC METABOLIC PANEL WITH GFR
BUN: 14 mg/dL (ref 6–23)
CALCIUM: 9.5 mg/dL (ref 8.4–10.5)
CO2: 27 meq/L (ref 19–32)
CREATININE: 1.18 mg/dL — AB (ref 0.50–1.10)
Chloride: 99 mEq/L (ref 96–112)
GFR, EST AFRICAN AMERICAN: 63 mL/min
GFR, Est Non African American: 55 mL/min — ABNORMAL LOW
GLUCOSE: 81 mg/dL (ref 70–99)
Potassium: 4.3 mEq/L (ref 3.5–5.3)
Sodium: 138 mEq/L (ref 135–145)

## 2013-09-13 LAB — CBC WITH DIFFERENTIAL/PLATELET
BASOS ABS: 0.1 10*3/uL (ref 0.0–0.1)
Basophils Relative: 1 % (ref 0–1)
EOS ABS: 0.1 10*3/uL (ref 0.0–0.7)
Eosinophils Relative: 1 % (ref 0–5)
HCT: 40.2 % (ref 36.0–46.0)
Hemoglobin: 14.1 g/dL (ref 12.0–15.0)
Lymphocytes Relative: 25 % (ref 12–46)
Lymphs Abs: 1.5 10*3/uL (ref 0.7–4.0)
MCH: 30.3 pg (ref 26.0–34.0)
MCHC: 35.1 g/dL (ref 30.0–36.0)
MCV: 86.3 fL (ref 78.0–100.0)
Monocytes Absolute: 0.4 10*3/uL (ref 0.1–1.0)
Monocytes Relative: 7 % (ref 3–12)
Neutro Abs: 4 10*3/uL (ref 1.7–7.7)
Neutrophils Relative %: 66 % (ref 43–77)
PLATELETS: 260 10*3/uL (ref 150–400)
RBC: 4.66 MIL/uL (ref 3.87–5.11)
RDW: 12.8 % (ref 11.5–15.5)
WBC: 6 10*3/uL (ref 4.0–10.5)

## 2013-09-13 LAB — HEPATIC FUNCTION PANEL
ALBUMIN: 4.6 g/dL (ref 3.5–5.2)
ALT: 15 U/L (ref 0–35)
AST: 18 U/L (ref 0–37)
Alkaline Phosphatase: 45 U/L (ref 39–117)
BILIRUBIN TOTAL: 0.5 mg/dL (ref 0.2–1.2)
Bilirubin, Direct: 0.1 mg/dL (ref 0.0–0.3)
Indirect Bilirubin: 0.4 mg/dL (ref 0.2–1.2)
TOTAL PROTEIN: 7.3 g/dL (ref 6.0–8.3)

## 2013-09-13 LAB — LIPID PANEL
CHOL/HDL RATIO: 2.3 ratio
Cholesterol: 169 mg/dL (ref 0–200)
HDL: 72 mg/dL (ref >39)
LDL Cholesterol: 70 mg/dL (ref 0–99)
TRIGLYCERIDES: 134 mg/dL (ref <150)
VLDL: 27 mg/dL (ref 0–40)

## 2013-09-13 LAB — TSH: TSH: 2.737 u[IU]/mL (ref 0.350–4.500)

## 2013-09-13 MED ORDER — LINACLOTIDE 145 MCG PO CAPS: 145.0000 ug | ORAL_CAPSULE | Freq: Every day | ORAL | Status: DC

## 2013-09-13 MED ORDER — DEXLANSOPRAZOLE 60 MG PO CPDR: 60.0000 mg | DELAYED_RELEASE_CAPSULE | Freq: Every day | ORAL | Status: DC

## 2013-09-13 NOTE — Patient Instructions (Signed)
Stress Management Stress is a state of physical or mental tension that often results from changes in your life or normal routine. Some common causes of stress are:  Death of a loved one.  Injuries or severe illnesses.  Getting fired or changing jobs.  Moving into a new home. Other causes may be:  Sexual problems.  Business or financial losses.  Taking on a large debt.  Regular conflict with someone at home or at work.  Constant tiredness from lack of sleep. It is not just bad things that are stressful. It may be stressful to:  Win the lottery.  Get married.  Buy a new car. The amount of stress that can be easily tolerated varies from person to person. Changes generally cause stress, regardless of the types of change. Too much stress can affect your health. It may lead to physical or emotional problems. Too little stress (boredom) may also become stressful. SUGGESTIONS TO REDUCE STRESS:  Talk things over with your family and friends. It often is helpful to share your concerns and worries. If you feel your problem is serious, you may want to get help from a professional counselor.  Consider your problems one at a time instead of lumping them all together. Trying to take care of everything at once may seem impossible. List all the things you need to do and then start with the most important one. Set a goal to accomplish 2 or 3 things each day. If you expect to do too many in a single day you will naturally fail, causing you to feel even more stressed.  Do not use alcohol or drugs to relieve stress. Although you may feel better for a short time, they do not remove the problems that caused the stress. They can also be habit forming.  Exercise regularly - at least 3 times per week. Physical exercise can help to relieve that "uptight" feeling and will relax you.  The shortest distance between despair and hope is often a good night's sleep.  Go to bed and get up on time allowing  yourself time for appointments without being rushed.  Take a short "time-out" period from any stressful situation that occurs during the day. Close your eyes and take some deep breaths. Starting with the muscles in your face, tense them, hold it for a few seconds, then relax. Repeat this with the muscles in your neck, shoulders, hand, stomach, back and legs.  Take good care of yourself. Eat a balanced diet and get plenty of rest.  Schedule time for having fun. Take a break from your daily routine to relax. HOME CARE INSTRUCTIONS   Call if you feel overwhelmed by your problems and feel you can no longer manage them on your own.  Return immediately if you feel like hurting yourself or someone else. Document Released: 11/06/2000 Document Revised: 08/05/2011 Document Reviewed: 01/05/2013 [facility] Patient Information 2014 [place], [place]. Depression, Adult Depression is feeling sad, low, down in the dumps, blue, gloomy, or empty. In general, there are two kinds of depression:  Normal sadness or grief. This can happen after something upsetting. It often goes away on its own within 2 weeks. After losing a loved one (bereavement), normal sadness and grief may last longer than two weeks. It usually gets better with time.  Clinical depression. This kind lasts longer than normal sadness or grief. It keeps you from doing the things you normally do in life. It is often hard to function at home, work, or at school.  It may affect your relationships with others. Treatment is often needed. GET HELP RIGHT AWAY IF:  You have thoughts about hurting yourself or others.  You lose touch with reality (psychotic symptoms). You may:  See or hear things that are not real.  Have untrue beliefs about your life or people around you.  Your medicine is giving you problems. MAKE SURE YOU:  Understand these instructions.  Will watch your condition.  Will get help right away if you are not doing well or get  worse. Document Released: 06/15/2010 Document Revised: 02/05/2012 Document Reviewed: 09/12/2011 [facility] Patient Information 2014 [place], [place].

## 2013-09-13 NOTE — Progress Notes (Signed)
Subjective:    Patient ID: Pamela Welch, female    DOB: [birth year], 48 y.o.   MRN: [id number]  HPI Comments: 48 yo female cholesterol f/u. She has been eating healthy and exercising routinely except for last week of tax season.  Patient was diagnosed with Barretts esophagus by endoscopy on 05/17/13 and started on Dexilant which has helped with cough/ sore throat. She was also started on Linzess by Dr Sharlett Iles for chronic constipation, Melanosis and inflammation which has also helped.   She has had increased stress with recent partner and relationship ending. She notes partner is with holding rights to visit their baby, she's 40,000$ in debt due to invitro costs/ lawyers. She notes she is talking with family and friends and declines prescription need.  Last labs  ALT           23   04/15/2013 AST           29   04/15/2013 ALKPHOS       49   04/15/2013 BILITOT      0.4   04/15/2013  CHOL         168   04/15/2013 HDL           74   04/15/2013 LDLCALC       71   04/15/2013 TRIG         117   04/15/2013 CHOLHDL      2.3   04/15/2013  HGBA1C      5.3   04/15/2013   Hyperlipidemia      Medication List       This list is accurate as of: 09/13/13 10:16 AM.  Always use your most recent med list.               aspirin 81 MG tablet  Take 81 mg by mouth daily.     buPROPion 300 MG 24 hr tablet  Commonly known as:  WELLBUTRIN XL  TAKE 1 TABLET EVERY MORNING FOR MOOD     CRESTOR 20 MG tablet  Generic drug:  rosuvastatin  TAKE 1 TABLET BY MOUTH EVERY DAY FOR CHOLESTEROL     dexlansoprazole 60 MG capsule  Commonly known as:  DEXILANT  Take 1 capsule (60 mg total) by mouth daily.     fluticasone 50 MCG/ACT nasal spray  Commonly known as:  FLONASE  Place 2 sprays into both nostrils daily.     hydrocortisone-pramoxine 2.5-1 % rectal cream  Commonly known as:  ANALPRAM-HC  USE RECTALLY 3 TIMES A DAY     ipratropium 0.06 % nasal spray  Commonly known as:  ATROVENT  Place 2  sprays into both nostrils 2 (two) times daily.     Linaclotide 145 MCG Caps capsule  Commonly known as:  LINZESS  Take 1 capsule (145 mcg total) by mouth daily.     LORazepam 2 MG tablet  Commonly known as:  ATIVAN  TAKE 1/2 TO 1 TABLET BY MOUTH AT BEDTIME AS NEEDED FOR SLEEP     montelukast 10 MG tablet  Commonly known as:  SINGULAIR     trazodone 300 MG tablet  Commonly known as:  DESYREL  TAKE 1/2 TO 1 TABLET EVERY DAY AT BEDTIME AS NEEDED FOR SLEEP       Allergies  Allergen Reactions  . Ambien [Zolpidem Tartrate]     Memory issues  . Atenolol   . Cholestatin   . Mirapex [Pramipexole Dihydrochloride]     Hot flashes  .  Requip [Ropinirole Hcl]     Hot flashes   Past Medical History  Diagnosis Date  . Chest pain, unspecified     normal myoview 2000;  echo 5/10:  mild LVH, EF 60-65%  . Palpitations     h/o symptomatic PVCs  . Restless legs syndrome (RLS)   . Obstructive sleep apnea (adult) (pediatric)   . Persistent disorder of initiating or maintaining sleep   . Anxiety   . Herniated disc   . Depression   . Hyperlipidemia   . Back injury   . Melanosis   . Lumbago   . Insomnia   . HSV-1 (herpes simplex virus 1) infection   . DDD (degenerative disc disease)   . Palpitations   . Hypertension     PT. DENIES     Review of Systems  Psychiatric/Behavioral: The patient is nervous/anxious.   All other systems reviewed and are negative.  BP 104/70  Pulse 94  Temp(Src) 98.2 F (36.8 C) (Temporal)  Resp 16  Ht 5\' 9"  (1.753 m)  Wt 154 lb (69.854 kg)  BMI 22.73 kg/m2  LMP 08/23/2013     Objective:   Physical Exam  Nursing note and vitals reviewed. Constitutional: She is oriented to person, place, and time. She appears well-developed and well-nourished. No distress.  HENT:  Head: Normocephalic and atraumatic.  Right Ear: External ear normal.  Left Ear: External ear normal.  Nose: Nose normal.  Mouth/Throat: Oropharynx is clear and moist.  Eyes:  Conjunctivae and EOM are normal.  Neck: Normal range of motion. Neck supple. No JVD present. No thyromegaly present.  Cardiovascular: Normal rate, regular rhythm, normal heart sounds and intact distal pulses.   Pulmonary/Chest: Effort normal and breath sounds normal.  Abdominal: Soft. Bowel sounds are normal. She exhibits no distension and no mass. There is no tenderness. There is no rebound and no guarding.  Musculoskeletal: Normal range of motion. She exhibits no edema and no tenderness.  Lymphadenopathy:    She has no cervical adenopathy.  Neurological: She is alert and oriented to person, place, and time. No cranial nerve deficit.  Skin: Skin is warm and dry. No rash noted. No erythema. No pallor.  Psychiatric: She has a normal mood and affect. Her behavior is normal. Judgment and thought content normal.  Tearful but appropriate           Assessment & Plan:  1. Fatigue vs stress- check labs, increase activity and H2O, w/c if symptoms increase for RX. Advised continue counseling with family/ friends  2. Cholesterol- recheck labs, Need to eat healthier and exercise AD.  3. GERD- Continue to decrease caffeine and continue RX AD.

## 2013-11-06 ENCOUNTER — Other Ambulatory Visit: Payer: Self-pay | Admitting: Gastroenterology

## 2013-11-06 ENCOUNTER — Other Ambulatory Visit: Payer: Self-pay | Admitting: Emergency Medicine

## 2013-11-08 NOTE — Telephone Encounter (Signed)
Ok to renew dexilant refill x 1 year

## 2013-11-08 NOTE — Telephone Encounter (Signed)
Patient had an office visit with Dr. Sharlett Iles on 05-2013. Patient was instructed to follow up with you in one year. Is it okay to refill Dexilant?

## 2013-12-05 ENCOUNTER — Other Ambulatory Visit: Payer: Self-pay | Admitting: Emergency Medicine

## 2014-01-03 ENCOUNTER — Other Ambulatory Visit: Payer: Self-pay | Admitting: Emergency Medicine

## 2014-01-12 ENCOUNTER — Other Ambulatory Visit: Payer: Self-pay | Admitting: Dermatology

## 2014-01-26 ENCOUNTER — Encounter: Payer: Self-pay | Admitting: *Deleted

## 2014-01-31 ENCOUNTER — Other Ambulatory Visit: Payer: Self-pay | Admitting: Gastroenterology

## 2014-02-01 NOTE — Telephone Encounter (Signed)
Ok to refill until follow-up 

## 2014-02-01 NOTE — Telephone Encounter (Signed)
Pamela Welch,   Patient due to see Dr. Hilarie Fredrickson [id number]. Is it okay to refill Linzess?

## 2014-02-01 NOTE — Telephone Encounter (Signed)
Dr. Hilarie Fredrickson,  Patient is due to see you 05-2014. Patient is requesting a refill of Linzess. Is it okay to refill?

## 2014-02-01 NOTE — Telephone Encounter (Signed)
As discussed

## 2014-02-04 ENCOUNTER — Other Ambulatory Visit: Payer: Self-pay | Admitting: Gastroenterology

## 2014-02-04 NOTE — Telephone Encounter (Signed)
Dr. Hilarie Fredrickson,   Patient is to follow up with you in one year. Patient is requesting refill on Linzess. Is it okay to refill?

## 2014-02-04 NOTE — Telephone Encounter (Signed)
Ok to refill 

## 2014-03-03 ENCOUNTER — Encounter (HOSPITAL_COMMUNITY): Payer: Self-pay

## 2014-03-03 ENCOUNTER — Observation Stay (HOSPITAL_COMMUNITY)
Admission: AD | Admit: 2014-03-03 | Discharge: 2014-03-05 | Disposition: A | Discharge status: Home / Self Care | Payer: BC Managed Care – PPO | Source: Ambulatory Visit | Attending: Internal Medicine | Admitting: Internal Medicine

## 2014-03-03 DIAGNOSIS — G4733 Obstructive sleep apnea (adult) (pediatric): Secondary | ICD-10-CM | POA: Diagnosis not present

## 2014-03-03 DIAGNOSIS — I10 Essential (primary) hypertension: Secondary | ICD-10-CM | POA: Insufficient documentation

## 2014-03-03 DIAGNOSIS — F329 Major depressive disorder, single episode, unspecified: Secondary | ICD-10-CM | POA: Insufficient documentation

## 2014-03-03 DIAGNOSIS — K589 Irritable bowel syndrome without diarrhea: Secondary | ICD-10-CM | POA: Insufficient documentation

## 2014-03-03 DIAGNOSIS — K5901 Slow transit constipation: Secondary | ICD-10-CM

## 2014-03-03 DIAGNOSIS — K2270 Barrett's esophagus without dysplasia: Secondary | ICD-10-CM | POA: Diagnosis not present

## 2014-03-03 DIAGNOSIS — E785 Hyperlipidemia, unspecified: Secondary | ICD-10-CM

## 2014-03-03 DIAGNOSIS — K219 Gastro-esophageal reflux disease without esophagitis: Secondary | ICD-10-CM

## 2014-03-03 DIAGNOSIS — K5900 Constipation, unspecified: Secondary | ICD-10-CM

## 2014-03-03 DIAGNOSIS — K922 Gastrointestinal hemorrhage, unspecified: Secondary | ICD-10-CM

## 2014-03-03 DIAGNOSIS — R1013 Epigastric pain: Secondary | ICD-10-CM | POA: Diagnosis not present

## 2014-03-03 DIAGNOSIS — F419 Anxiety disorder, unspecified: Secondary | ICD-10-CM | POA: Diagnosis not present

## 2014-03-03 DIAGNOSIS — G4700 Insomnia, unspecified: Secondary | ICD-10-CM

## 2014-03-03 DIAGNOSIS — Z800 Family history of malignant neoplasm of digestive organs: Secondary | ICD-10-CM | POA: Diagnosis not present

## 2014-03-03 DIAGNOSIS — R109 Unspecified abdominal pain: Secondary | ICD-10-CM | POA: Diagnosis present

## 2014-03-03 DIAGNOSIS — K921 Melena: Secondary | ICD-10-CM | POA: Diagnosis not present

## 2014-03-03 DIAGNOSIS — K6389 Other specified diseases of intestine: Secondary | ICD-10-CM | POA: Insufficient documentation

## 2014-03-03 DIAGNOSIS — R933 Abnormal findings on diagnostic imaging of other parts of digestive tract: Secondary | ICD-10-CM

## 2014-03-03 DIAGNOSIS — R195 Other fecal abnormalities: Secondary | ICD-10-CM

## 2014-03-03 DIAGNOSIS — L814 Other melanin hyperpigmentation: Secondary | ICD-10-CM

## 2014-03-03 DIAGNOSIS — I493 Ventricular premature depolarization: Secondary | ICD-10-CM | POA: Insufficient documentation

## 2014-03-03 DIAGNOSIS — K449 Diaphragmatic hernia without obstruction or gangrene: Secondary | ICD-10-CM

## 2014-03-03 DIAGNOSIS — R002 Palpitations: Secondary | ICD-10-CM | POA: Diagnosis not present

## 2014-03-03 DIAGNOSIS — R1030 Lower abdominal pain, unspecified: Secondary | ICD-10-CM

## 2014-03-03 DIAGNOSIS — K2971 Gastritis, unspecified, with bleeding: Secondary | ICD-10-CM

## 2014-03-03 HISTORY — DX: Barrett's esophagus without dysplasia: K22.70

## 2014-03-03 HISTORY — DX: Other specified diseases of intestine: K63.89

## 2014-03-03 LAB — COMPREHENSIVE METABOLIC PANEL
ALBUMIN: 3.6 g/dL (ref 3.5–5.2)
ALT: 12 U/L (ref 0–35)
ANION GAP: 11 (ref 5–15)
AST: 15 U/L (ref 0–37)
Alkaline Phosphatase: 51 U/L (ref 39–117)
BILIRUBIN TOTAL: 0.4 mg/dL (ref 0.3–1.2)
BUN: 9 mg/dL (ref 6–23)
CALCIUM: 9 mg/dL (ref 8.4–10.5)
CO2: 29 mEq/L (ref 19–32)
CREATININE: 1.01 mg/dL (ref 0.50–1.10)
Chloride: 100 mEq/L (ref 96–112)
GFR calc Af Amer: 74 mL/min — ABNORMAL LOW (ref >90)
GFR calc non Af Amer: 64 mL/min — ABNORMAL LOW (ref >90)
Glucose, Bld: 96 mg/dL (ref 70–99)
Potassium: 4 mEq/L (ref 3.7–5.3)
Sodium: 140 mEq/L (ref 137–147)
Total Protein: 6.8 g/dL (ref 6.0–8.3)

## 2014-03-03 LAB — URINALYSIS, ROUTINE W REFLEX MICROSCOPIC
Bilirubin Urine: NEGATIVE
Glucose, UA: NEGATIVE mg/dL
Hgb urine dipstick: NEGATIVE
KETONES UR: NEGATIVE mg/dL
Leukocytes, UA: NEGATIVE
NITRITE: NEGATIVE
PH: 6.5 (ref 5.0–8.0)
Protein, ur: NEGATIVE mg/dL
Specific Gravity, Urine: 1.004 — ABNORMAL LOW (ref 1.005–1.030)
Urobilinogen, UA: 0.2 mg/dL (ref 0.0–1.0)

## 2014-03-03 LAB — CBC WITH DIFFERENTIAL/PLATELET
BASOS ABS: 0 10*3/uL (ref 0.0–0.1)
BASOS PCT: 1 % (ref 0–1)
Eosinophils Absolute: 0.1 10*3/uL (ref 0.0–0.7)
Eosinophils Relative: 1 % (ref 0–5)
HCT: 36.3 % (ref 36.0–46.0)
Hemoglobin: 12.5 g/dL (ref 12.0–15.0)
LYMPHS PCT: 30 % (ref 12–46)
Lymphs Abs: 1.9 10*3/uL (ref 0.7–4.0)
MCH: 30.1 pg (ref 26.0–34.0)
MCHC: 34.4 g/dL (ref 30.0–36.0)
MCV: 87.5 fL (ref 78.0–100.0)
MONOS PCT: 6 % (ref 3–12)
Monocytes Absolute: 0.4 10*3/uL (ref 0.1–1.0)
Neutro Abs: 3.9 10*3/uL (ref 1.7–7.7)
Neutrophils Relative %: 62 % (ref 43–77)
Platelets: 204 10*3/uL (ref 150–400)
RBC: 4.15 MIL/uL (ref 3.87–5.11)
RDW: 11.7 % (ref 11.5–15.5)
WBC: 6.3 10*3/uL (ref 4.0–10.5)

## 2014-03-03 LAB — LIPASE, BLOOD: LIPASE: 20 U/L (ref 11–59)

## 2014-03-03 LAB — OCCULT BLOOD X 1 CARD TO LAB, STOOL: Fecal Occult Bld: POSITIVE — AB

## 2014-03-03 MED ORDER — ACETAMINOPHEN 325 MG PO TABS: 650.0000 mg | ORAL_TABLET | Freq: Four times a day (QID) | ORAL | Status: DC | PRN

## 2014-03-03 MED ORDER — PANTOPRAZOLE SODIUM 40 MG IV SOLR
40.0000 mg | INTRAVENOUS | Status: DC
  Administered 2014-03-03: 40 mg via INTRAVENOUS
  Filled 2014-03-03: qty 40

## 2014-03-03 MED ORDER — BUPROPION HCL ER (XL) 300 MG PO TB24
300.0000 mg | ORAL_TABLET | Freq: Every day | ORAL | Status: DC
  Administered 2014-03-04 – 2014-03-05 (×2): 300 mg via ORAL
  Filled 2014-03-03 (×2): qty 1

## 2014-03-03 MED ORDER — SODIUM CHLORIDE 0.9 % IV SOLN
INTRAVENOUS | Status: DC
  Administered 2014-03-03: 13:00:00 via INTRAVENOUS

## 2014-03-03 MED ORDER — LINACLOTIDE 145 MCG PO CAPS
145.0000 ug | ORAL_CAPSULE | Freq: Every day | ORAL | Status: DC
  Administered 2014-03-04 – 2014-03-05 (×2): 145 ug via ORAL
  Filled 2014-03-03 (×2): qty 1

## 2014-03-03 MED ORDER — GI COCKTAIL ~~LOC~~
30.0000 mL | Freq: Three times a day (TID) | ORAL | Status: DC | PRN
  Filled 2014-03-03: qty 30

## 2014-03-03 MED ORDER — LORAZEPAM 1 MG PO TABS
1.0000 mg | ORAL_TABLET | Freq: Every evening | ORAL | Status: DC | PRN
  Administered 2014-03-03 – 2014-03-04 (×2): 2 mg via ORAL
  Filled 2014-03-03: qty 2
  Filled 2014-03-03 (×2): qty 1

## 2014-03-03 MED ORDER — ONDANSETRON HCL 4 MG PO TABS: 4.0000 mg | ORAL_TABLET | Freq: Four times a day (QID) | ORAL | Status: DC | PRN

## 2014-03-03 MED ORDER — MONTELUKAST SODIUM 10 MG PO TABS
10.0000 mg | ORAL_TABLET | Freq: Every day | ORAL | Status: DC
  Filled 2014-03-03 (×3): qty 1

## 2014-03-03 MED ORDER — ONDANSETRON HCL 4 MG/2ML IJ SOLN: 4.0000 mg | Freq: Four times a day (QID) | INTRAMUSCULAR | Status: DC | PRN

## 2014-03-03 MED ORDER — ROSUVASTATIN CALCIUM 20 MG PO TABS
20.0000 mg | ORAL_TABLET | Freq: Every day | ORAL | Status: DC
  Administered 2014-03-03 – 2014-03-05 (×3): 20 mg via ORAL
  Filled 2014-03-03 (×3): qty 1

## 2014-03-03 MED ORDER — ACETAMINOPHEN 650 MG RE SUPP: 650.0000 mg | Freq: Four times a day (QID) | RECTAL | Status: DC | PRN

## 2014-03-03 MED ORDER — TRAZODONE HCL 50 MG PO TABS
150.0000 mg | ORAL_TABLET | Freq: Two times a day (BID) | ORAL | Status: DC | PRN
  Administered 2014-03-03 – 2014-03-04 (×2): 150 mg via ORAL
  Filled 2014-03-03 (×2): qty 3

## 2014-03-03 MED ORDER — PANTOPRAZOLE SODIUM 40 MG IV SOLR
40.0000 mg | Freq: Two times a day (BID) | INTRAVENOUS | Status: DC
  Administered 2014-03-04: 40 mg via INTRAVENOUS
  Filled 2014-03-03 (×2): qty 40

## 2014-03-03 MED ORDER — OXYCODONE HCL 5 MG PO TABS: 5.0000 mg | ORAL_TABLET | ORAL | Status: DC | PRN

## 2014-03-03 MED ORDER — TRAMADOL HCL 50 MG PO TABS: 50.0000 mg | ORAL_TABLET | Freq: Four times a day (QID) | ORAL | Status: DC | PRN

## 2014-03-03 NOTE — H&P (Signed)
History and Physical:    Pamela Welch XBJ:[id number] DOB: 11/15/1965 DOA: 03/03/2014  PCP: Alesia Richards, MD   Chief Complaint: Syncope, abdominal pain, blood in stools.  History of Present Illness:   Pamela Welch is an 48 y.o. female with a PMH of Barrett's esophagus, melanosis coli status post colonoscopy 11/25/11, chronic constipation managed with Linzess, moderate hiatal hernia status post EGD 05/17/13, on chronic Dexilant, who presents with a 4-5 week history of abdominal pain, nausea, anorexia, weight loss, fatigue.  Symptoms improved slightly after a week or so (she attributed it to a stomach virus), but then she developed severe abdominal pain, at times sharp, colicky in nature over the past 24 hours. The pain was almost so severe that she thought of calling 911 last evening. She thought she may have been constipated so she gave herself an enema, and the pain subsided enough that she could fall asleep.  She also has used OTC senna.  Pain is in mid/lower central abdomen with radiation to the back.  The patient has had 2 syncopal episodes in the past, associated with attempts to defecate, but none recent.  ROS:   Constitutional: + fever, + chills, +diaphoresis associated with abdominal pain;  Appetite diminished; + unintentional weight loss of 7 lbs in 4 weeks, + fatigue.  HEENT: No blurry vision, no diplopia, no pharyngitis, +hoarseness and significant regurgitation with excessive gag reflex, no dysphagia CV: No chest pain, no palpitations, no PND, no orthopnea, no edema.  Resp: + SOB, no cough, no pleuritic pain. GI: + nausea, no vomiting, no diarrhea, + melena, + occasional hematochezia, + hemorrhoids, + chronic constipation, + abdominal pain.  GU: No dysuria, no hematuria, + frequency, no urgency. MSK: no myalgias, no arthralgias.  Neuro:  + headache (not new), no focal neurological deficits, no history of seizures.  Psych: + depression, + anxiety, +insomnia x 2 months.  Endo: +  heat intolerance at night, + cold intolerance during day, no polyuria, no polydipsia  Skin: No rashes, no skin lesions.  Heme: No easy bruising.  Travel history: No recent travel.   Past Medical History:   Past Medical History  Diagnosis Date  . Chest pain, unspecified     normal myoview 2000;  echo 5/10:  mild LVH, EF 60-65%  . Palpitations     h/o symptomatic PVCs  . Restless legs syndrome (RLS)   . Obstructive sleep apnea (adult) (pediatric)   . Persistent disorder of initiating or maintaining sleep   . Anxiety   . Herniated disc   . Depression   . Hyperlipidemia   . Back injury   . Barrett's esophagus   . Lumbago   . Insomnia   . HSV-1 (herpes simplex virus 1) infection   . DDD (degenerative disc disease)   . Palpitations   . Hypertension     PT. DENIES  . Melanosis coli     Past Surgical History:   Past Surgical History  Procedure Laterality Date  . Tubes in ears      as an adult  . Refractive surgery      bilateral eyes   . Eye surgery  2001  . Colonoscopy  2013    Social History:   History   Social History  . Marital Status: Single    Spouse Name: N/A    Number of Children: 2  . Years of Education: N/A   Occupational History  . cpa    Social History Main Topics  .  Smoking status: Never Smoker   . Smokeless tobacco: Never Used  . Alcohol Use: Yes     Comment: Social  . Drug Use: No  . Sexual Activity: Yes    Birth Control/ Protection: None   Other Topics Concern  . Not on file   Social History Narrative   Single.  Lives alone.  2 teenage children.    Family history:   Family History  Problem Relation Age of Onset  . Hypertension Mother   . Osteoporosis Mother   . Breast cancer Sister 38    BRCA NEG/BILAT MASTECTOMY/CHEMO X 1 YR  . Hyperlipidemia Sister   . Cancer Sister     breast/ ovary  . Ovarian cancer Neg Hx   . Colon cancer Maternal Grandmother   . Stroke Maternal Grandmother   . Osteoporosis Maternal Grandmother   .  Cancer Maternal Grandmother     MGGM colon  . Colon cancer Maternal Grandfather   . Cancer Maternal Grandfather     colon  . Cancer Paternal Grandmother     multiple myeloma  . Diabetes Paternal Grandfather   . Hypertension Father   . Hyperlipidemia Father   . Heart disease Cousin   . Cancer Cousin     lung    Allergies   Ambien; Atenolol; Cholestatin; Mirapex; and Requip  Current Medications:   Prior to Admission medications   Medication Sig Start Date End Date Taking? Authorizing Provider  aspirin 81 MG tablet Take 81 mg by mouth daily.      Historical Provider, MD  buPROPion (WELLBUTRIN XL) 300 MG 24 hr tablet TAKE 1 TABLET EVERY MORNING FOR MOOD 04/11/13   Melissa R Smith, PA-C  CRESTOR 20 MG tablet TAKE 1 TABLET BY MOUTH EVERY DAY FOR CHOLESTEROL 01/03/14   Unk Pinto, MD  DEXILANT 60 MG capsule TAKE 1 CAPSULE BY MOUTH ONCE DAILY    Jerene Bears, MD  dexlansoprazole (DEXILANT) 60 MG capsule Take 1 capsule (60 mg total) by mouth daily. 09/13/13   Melissa R Smith, PA-C  fluticasone (FLONASE) 50 MCG/ACT nasal spray Place 2 sprays into both nostrils daily.    Historical Provider, MD  hydrocortisone-pramoxine [facility]) 2.5-1 % rectal cream USE RECTALLY 3 TIMES A DAY 08/06/13   Huel Cote, NP  ipratropium (ATROVENT) 0.06 % nasal spray Place 2 sprays into both nostrils 2 (two) times daily.    Historical Provider, MD  Linaclotide Rolan Lipa) 145 MCG CAPS capsule Take 1 capsule (145 mcg total) by mouth daily. 09/13/13   Ardis Hughs, PA-C  LINZESS 145 MCG CAPS capsule TAKE 1 CAPSULE BY MOUTH ONCE DAILY 02/01/14   Laban Emperor. Zehr, PA-C  LINZESS 145 MCG CAPS capsule TAKE 1 CAPSULE BY MOUTH ONCE DAILY 02/04/14   Laban Emperor. Zehr, PA-C  LORazepam (ATIVAN) 2 MG tablet TAKE 1/2 TO 1 TABLET AT BEDTIME AS NEEDED FOR SLEEP    Melissa R Smith, PA-C  montelukast (SINGULAIR) 10 MG tablet TAKE 1 TABLET EVERY DAY    Melissa R Smith, PA-C  trazodone (DESYREL) 300 MG tablet TAKE 1/2 TO 1 TABLET  EVERY DAY AT BEDTIME AS NEEDED FOR SLEEP 04/11/13   Ardis Hughs, PA-C    Physical Exam:   Filed Vitals:   03/03/14 1300  BP: 102/69  Pulse: 94  Temp: 98 F (36.7 C)  TempSrc: Oral  Resp: 18  Height: 5' 9" (1.753 m)  Weight: 68.04 kg (150 lb)  SpO2: 100%     Physical Exam: Blood pressure  102/69, pulse 94, temperature 98 F (36.7 C), temperature source Oral, resp. rate 18, height 5' 9" (1.753 m), weight 68.04 kg (150 lb), SpO2 100.00%. Gen: No acute distress. Head: Normocephalic, atraumatic. Eyes: PERRL, EOMI, sclerae nonicteric. Mouth: Oropharynx clear.  Neck: Supple, no thyromegaly, no lymphadenopathy, no jugular venous distention. Chest: Lungs CTAB. CV: Heart sounds are regular. No murmurs, rubs, or gallops. Abdomen: Soft, tender left lower quadrant, nondistended with normal hyperactive bowel sounds. Extremities: Extremities are without clubbing, edema, or cyanosis. Skin: Warm and dry. Neuro: Alert and oriented times 3; cranial nerves II through XII grossly intact. Psych: Mood and affect mildly depressed/anxious.   Data Review:    Labs: Basic Metabolic Panel:  Recent Labs Lab 03/03/14 1553  NA 140  K 4.0  CL 100  CO2 29  GLUCOSE 96  BUN 9  CREATININE 1.01  CALCIUM 9.0   Liver Function Tests:  Recent Labs Lab 03/03/14 1553  AST 15  ALT 12  ALKPHOS 51  BILITOT 0.4  PROT 6.8  ALBUMIN 3.6    Recent Labs Lab 03/03/14 1300  LIPASE 20   CBC:  Recent Labs Lab 03/03/14 1300  WBC 6.3  NEUTROABS 3.9  HGB 12.5  HCT 36.3  MCV 87.5  PLT 204    Radiographic Studies: No results found.   Assessment/Plan:   Principal Problem:   GI bleed / Abdominal pain in a patient with a history of Barrett's esophagus / GERD / hiatal hernia  Hemoglobin stable, not orthostatic.  Stool heme +.  BID PPI therapy.  Check CT abd/pelvis.  If hemoglobin drops, GI consult.  GI cocktail and anti-emetics PRN, gentle IVF.  No evidence of  cholecystitis, pancreatitis.  Active Problems:   Hyperlipidemia  Continue Crestor.    PERSISTENT DISORDER INITIATING/MAINTAINING SLEEP  Continue Trazodone, Ativan Q HS PRN.    Constipation  Chronic issue.  Continue Linzess.    DVT prophylaxis  SCDs.  Code Status: Full. Family Communication: Updated children/loved ones at bedside Disposition Plan: Home when stable.  Time spent: 1 hour.  RAMA,CHRISTINA [facility] Pager [phone] Cell: [phone]   If 7PM-7AM, please contact night-coverage [email or web address] Password [email or web address] 03/03/2014, 7:28 PM

## 2014-03-04 ENCOUNTER — Ambulatory Visit (HOSPITAL_COMMUNITY): Payer: BC Managed Care – PPO

## 2014-03-04 ENCOUNTER — Encounter (HOSPITAL_COMMUNITY)
Admission: AD | Disposition: A | Discharge status: Home / Self Care | Payer: BC Managed Care – PPO | Source: Ambulatory Visit | Attending: Internal Medicine

## 2014-03-04 ENCOUNTER — Encounter (HOSPITAL_COMMUNITY): Payer: Self-pay | Admitting: Radiology

## 2014-03-04 DIAGNOSIS — K449 Diaphragmatic hernia without obstruction or gangrene: Secondary | ICD-10-CM

## 2014-03-04 DIAGNOSIS — K2971 Gastritis, unspecified, with bleeding: Secondary | ICD-10-CM

## 2014-03-04 DIAGNOSIS — K2270 Barrett's esophagus without dysplasia: Secondary | ICD-10-CM | POA: Diagnosis not present

## 2014-03-04 DIAGNOSIS — R109 Unspecified abdominal pain: Secondary | ICD-10-CM

## 2014-03-04 DIAGNOSIS — K5901 Slow transit constipation: Secondary | ICD-10-CM

## 2014-03-04 DIAGNOSIS — K921 Melena: Secondary | ICD-10-CM

## 2014-03-04 HISTORY — PX: ESOPHAGOGASTRODUODENOSCOPY: SHX5428

## 2014-03-04 LAB — CBC
HEMATOCRIT: 34.7 % — AB (ref 36.0–46.0)
HEMOGLOBIN: 11.7 g/dL — AB (ref 12.0–15.0)
MCH: 29.8 pg (ref 26.0–34.0)
MCHC: 33.7 g/dL (ref 30.0–36.0)
MCV: 88.5 fL (ref 78.0–100.0)
Platelets: 209 10*3/uL (ref 150–400)
RBC: 3.92 MIL/uL (ref 3.87–5.11)
RDW: 11.8 % (ref 11.5–15.5)
WBC: 5.6 10*3/uL (ref 4.0–10.5)

## 2014-03-04 LAB — URINE CULTURE
CULTURE: NO GROWTH
Colony Count: NO GROWTH

## 2014-03-04 LAB — BASIC METABOLIC PANEL
Anion gap: 5 (ref 5–15)
BUN: 8 mg/dL (ref 6–23)
CHLORIDE: 106 meq/L (ref 96–112)
CO2: 28 meq/L (ref 19–32)
CREATININE: 1.03 mg/dL (ref 0.50–1.10)
Calcium: 8.6 mg/dL (ref 8.4–10.5)
GFR calc Af Amer: 73 mL/min — ABNORMAL LOW (ref >90)
GFR calc non Af Amer: 63 mL/min — ABNORMAL LOW (ref >90)
GLUCOSE: 89 mg/dL (ref 70–99)
Potassium: 4.1 mEq/L (ref 3.7–5.3)
Sodium: 139 mEq/L (ref 137–147)

## 2014-03-04 SURGERY — EGD (ESOPHAGOGASTRODUODENOSCOPY)
Anesthesia: Moderate Sedation

## 2014-03-04 MED ORDER — FENTANYL CITRATE 0.05 MG/ML IJ SOLN
INTRAMUSCULAR | Status: DC | PRN
  Administered 2014-03-04 (×2): 25 ug via INTRAVENOUS

## 2014-03-04 MED ORDER — SODIUM CHLORIDE 0.9 % IV SOLN
INTRAVENOUS | Status: DC
  Administered 2014-03-04: 500 mL via INTRAVENOUS

## 2014-03-04 MED ORDER — IOHEXOL 300 MG/ML  SOLN
25.0000 mL | INTRAMUSCULAR | Status: AC
  Administered 2014-03-04 (×2): 25 mL via ORAL

## 2014-03-04 MED ORDER — DIPHENHYDRAMINE HCL 50 MG/ML IJ SOLN
INTRAMUSCULAR | Status: AC
  Filled 2014-03-04: qty 1

## 2014-03-04 MED ORDER — MIDAZOLAM HCL 10 MG/2ML IJ SOLN
INTRAMUSCULAR | Status: AC
Start: 2014-03-04 — End: 2014-03-04
  Filled 2014-03-04: qty 2

## 2014-03-04 MED ORDER — POLYETHYLENE GLYCOL 3350 17 G PO PACK
17.0000 g | PACK | Freq: Two times a day (BID) | ORAL | Status: DC
  Administered 2014-03-05: 17 g via ORAL
  Filled 2014-03-04 (×4): qty 1

## 2014-03-04 MED ORDER — IOHEXOL 300 MG/ML  SOLN
100.0000 mL | Freq: Once | INTRAMUSCULAR | Status: AC | PRN
  Administered 2014-03-04: 100 mL via INTRAVENOUS

## 2014-03-04 MED ORDER — FAMOTIDINE 40 MG PO TABS
40.0000 mg | ORAL_TABLET | Freq: Every day | ORAL | Status: DC
  Administered 2014-03-04: 40 mg via ORAL
  Filled 2014-03-04 (×2): qty 1

## 2014-03-04 MED ORDER — PANTOPRAZOLE SODIUM 40 MG PO TBEC
40.0000 mg | DELAYED_RELEASE_TABLET | Freq: Two times a day (BID) | ORAL | Status: DC
  Administered 2014-03-04 – 2014-03-05 (×2): 40 mg via ORAL
  Filled 2014-03-04 (×5): qty 1

## 2014-03-04 MED ORDER — FENTANYL CITRATE 0.05 MG/ML IJ SOLN
INTRAMUSCULAR | Status: AC
  Filled 2014-03-04: qty 2

## 2014-03-04 MED ORDER — BUTAMBEN-TETRACAINE-BENZOCAINE 2-2-14 % EX AERO
INHALATION_SPRAY | CUTANEOUS | Status: DC | PRN
  Administered 2014-03-04: 2 via TOPICAL

## 2014-03-04 MED ORDER — MIDAZOLAM HCL 10 MG/2ML IJ SOLN
INTRAMUSCULAR | Status: DC | PRN
  Administered 2014-03-04 (×2): 2 mg via INTRAVENOUS

## 2014-03-04 NOTE — Op Note (Signed)
[facility] [place], [id number]   ENDOSCOPY PROCEDURE REPORT  PATIENT: Pamela Welch, Pamela Welch  MR#: [id number] BIRTHDATE: [birth year] , 64  yrs. old GENDER: female ENDOSCOPIST: Ladene Artist, MD, [facility] REFERRED BY:  [facility] PROCEDURE DATE:  03/04/2014 PROCEDURE:  EGD, diagnostic ASA CLASS:     Class II INDICATIONS:  epigastric abdominal pain, melena, heme positive stool, and abnormal CT of the stomach. MEDICATIONS: Fentanyl 50 mcg IV and Versed 4 mg IV TOPICAL ANESTHETIC: Cetacaine Spray DESCRIPTION OF PROCEDURE: After the risks benefits and alternatives of the procedure were thoroughly explained, informed consent was obtained.  The Pentax Gastroscope [id number] endoscope was introduced through the mouth and advanced to the second portion of the duodenum , Without limitations.  The instrument was slowly withdrawn as the mucosa was fully examined.  ESOPHAGUS: There was a 2cm segment of Barrett's esophagus without dysplasia found in the distal esophagus.  The length of circumferential Barrett's was 0cm ([place] C0). The total length of Barrett's was 2 cm (M2).  The mucosa of the esophagus appeared normal.   The esophagus was otherwise normal. STOMACH: The mucosa and folds of the stomach appeared normal. DUODENUM: The duodenal mucosa showed no abnormalities in the bulb and 2nd part of the duodenum.  Retroflexed views revealed no abnormalities.  The scope was then withdrawn from the patient and the procedure completed.  COMPLICATIONS: There were no immediate complications.  ENDOSCOPIC IMPRESSION: 1.   Barrett's esophagus in the distal esophagus 2.   The EGD otherwise appeared normal  RECOMMENDATIONS: 1.  Anti-reflux regimen long term 2.  PPI bid and H2RA hs 3.  Abd [place] 4.  Consider further evaluate of heme + dark stool as an outpatient: capsule endoscopy and/or repeat colonoscopy 5.  Repeat EGD for Barrett's surveillance in 04/2016  eSigned:   Ladene Artist, MD, [facility] 03/04/2014 3:45 PM

## 2014-03-04 NOTE — Care Management Note (Signed)
CARE MANAGEMENT NOTE 03/04/2014  Patient:  Pamela Welch, Pamela Welch   Account Number:  [id number]  Date Initiated:  03/04/2014  Documentation initiated by:  Marney Doctor  Subjective/Objective Assessment:   48 yo admitted with GI bleed     Action/Plan:   From home alone   Anticipated DC Date:  03/05/2014   Anticipated DC Plan:  [place]  CM consult      Choice offered to / List presented to:             Status of service:  In process, will continue to follow Medicare Important Message given?   (If response is "NO", the following Medicare IM given date fields will be blank) Date Medicare IM given:   Medicare IM given by:   Date Additional Medicare IM given:   Additional Medicare IM given by:    Discharge Disposition:    Per UR Regulation:  Reviewed for med. necessity/level of care/duration of stay  If discussed at [place] of Stay Meetings, dates discussed:    Comments:  03/04/14 Marney Doctor RN,BSN,NCM Pt for EGD today.  CM monitoring for DC needs.  CM will continue to follow.

## 2014-03-04 NOTE — Consult Note (Signed)
Referring Provider: [facility] Primary Care Physician:  Alesia Richards, MD Primary Gastroenterologist:  Dr. Sharlett Iles  Reason for Consultation:  Abdominal pain, heme positive stools      HPI: Pamela Welch is a 48 y.o. female who had been followed by Dr Sharlett Iles for acid reflus, Barrett's and IBS-C with melanosis coli.Colonoscopy in 2013 showed severe melanosis, and EGD in 2014 showed Barretts. Since she was last seen in January, she has been on dexilant. She has no further nocturnal cough, but she does get breakthrough heartburn 2-3 days per week. Over the past month or so, she says she has to sit up after meals to let her food pass. She does not feel food gets stuck in her esophagus, but she feels it sits in her stomach for a long time before it passes through. She has also been on linzess 145 mcg qd with miralx 2-3 times per week and was having a daily BM. However, approximately 4-5 weeks ago, she feels she had a "stomach bug" and began to have nausea, vomiting and diarrhea. These symptoms have mostly subsided, but over the past 2 weeks she has mid abdominal pain (she points to epigastric area) after meals, associated with nausea and vomitng.The pain radiates around to the back on both sides, and at times is so severe she breaks out in a sweat. She has also had diarrhea alternating with formed stools, but has had more cramping with defecation. She says the pain was so severe on 2 occasions in the past week that she almost passed out. She came to the ER on 10/8 and had an abd CT earlier today that showed thickening of the distal stomach.She says she has had black stools on several occasion over the past 1-1 1/2 weeks, and her stools were positive for blood on admission. She denies use of NSAIDS, but says she takes a baby aspirin 2-3 times per week.She has had one episode on a small amount red blood on toilet tissue as well. LMP 2 weeks ago, normal.   Past Medical History  Diagnosis  Date  . Chest pain, unspecified     normal myoview 2000;  echo 5/10:  mild LVH, EF 60-65%  . Palpitations     h/o symptomatic PVCs  . Restless legs syndrome (RLS)   . Obstructive sleep apnea (adult) (pediatric)   . Persistent disorder of initiating or maintaining sleep   . Anxiety   . Herniated disc   . Depression   . Hyperlipidemia   . Back injury   . Barrett's esophagus   . Lumbago   . Insomnia   . HSV-1 (herpes simplex virus 1) infection   . DDD (degenerative disc disease)   . Palpitations   . Hypertension     PT. DENIES  . Melanosis coli     Past Surgical History  Procedure Laterality Date  . Tubes in ears      as an adult  . Refractive surgery      bilateral eyes   . Eye surgery  2001  . Colonoscopy  2013    Prior to Admission medications   Medication Sig Start Date End Date Taking? Authorizing Provider  aspirin 81 MG tablet Take 81 mg by mouth daily.     Yes Historical Provider, MD  buPROPion (WELLBUTRIN XL) 300 MG 24 hr tablet Take 300 mg by mouth every morning. For mood   Yes Historical Provider, MD  dexlansoprazole (DEXILANT) 60 MG capsule Take 1 capsule (60 mg total)  by mouth daily. 09/13/13  Yes Melissa R Smith, PA-C  fluticasone (FLONASE) 50 MCG/ACT nasal spray Place 2 sprays into both nostrils daily.   Yes Historical Provider, MD  ipratropium (ATROVENT) 0.06 % nasal spray Place 2 sprays into both nostrils 2 (two) times daily.   Yes Historical Provider, MD  Linaclotide Rolan Lipa) 145 MCG CAPS capsule Take 1 capsule (145 mcg total) by mouth daily. 09/13/13  Yes Melissa R Smith, PA-C  LORazepam (ATIVAN) 2 MG tablet Take 1-2 mg by mouth at bedtime as needed (for sleep).   Yes Historical Provider, MD  montelukast (SINGULAIR) 10 MG tablet Take 10 mg by mouth at bedtime.   Yes Historical Provider, MD  rosuvastatin (CRESTOR) 20 MG tablet Take 20 mg by mouth daily. evening   Yes Historical Provider, MD  trazodone (DESYREL) 300 MG tablet Take 150-300 mg by mouth 3  times/day as needed-between meals & bedtime for sleep.   Yes Historical Provider, MD    Current Facility-Administered Medications  Medication Dose Route Frequency Provider Last Rate Last Dose  . 0.9 %  sodium chloride infusion   Intravenous Continuous Venetia Maxon Rama, MD 50 mL/hr at 03/03/14 1300    . acetaminophen (TYLENOL) tablet 650 mg  650 mg Oral Q6H PRN Venetia Maxon Rama, MD       Or  . acetaminophen (TYLENOL) suppository 650 mg  650 mg Rectal Q6H PRN Christina P Rama, MD      . buPROPion (WELLBUTRIN XL) 24 hr tablet 300 mg  300 mg Oral Daily Venetia Maxon Rama, MD   300 mg at 03/04/14 1155  . gi cocktail (Maalox,Lidocaine,Donnatal)  30 mL Oral TID PRN Venetia Maxon Rama, MD      . Linaclotide (LINZESS) capsule 145 mcg  145 mcg Oral Daily Venetia Maxon Rama, MD   145 mcg at 03/04/14 1155  . LORazepam (ATIVAN) tablet 1-2 mg  1-2 mg Oral QHS PRN Venetia Maxon Rama, MD   2 mg at 03/03/14 2141  . montelukast (SINGULAIR) tablet 10 mg  10 mg Oral QHS Christina P Rama, MD      . ondansetron (ZOFRAN) tablet 4 mg  4 mg Oral Q6H PRN Christina P Rama, MD       Or  . ondansetron (ZOFRAN) injection 4 mg  4 mg Intravenous Q6H PRN Christina P Rama, MD      . oxyCODONE (Oxy IR/ROXICODONE) immediate release tablet 5 mg  5 mg Oral Q4H PRN Christina P Rama, MD      . pantoprazole (PROTONIX) injection 40 mg  40 mg Intravenous Q12H Venetia Maxon Rama, MD   40 mg at 03/04/14 1155  . rosuvastatin (CRESTOR) tablet 20 mg  20 mg Oral Daily Venetia Maxon Rama, MD   20 mg at 03/04/14 1155  . traZODone (DESYREL) tablet 150-300 mg  150-300 mg Oral BID BM & HS PRN Venetia Maxon Rama, MD   150 mg at 03/03/14 2141    Allergies as of 03/03/2014 - Review Complete 03/03/2014  Allergen Reaction Noted  . Ambien [zolpidem tartrate]  04/14/2013  . Atenolol  04/14/2013  . Cholestatin  05/06/2011  . Mirapex [pramipexole dihydrochloride]  04/14/2013  . Requip [ropinirole hcl]  04/14/2013    Family History  Problem Relation Age of  Onset  . Hypertension Mother   . Osteoporosis Mother   . Breast cancer Sister 73    BRCA NEG/BILAT MASTECTOMY/CHEMO X 1 YR  . Hyperlipidemia Sister   . Cancer Sister     breast/ ovary  .  Ovarian cancer Neg Hx   . Colon cancer Maternal Grandmother   . Stroke Maternal Grandmother   . Osteoporosis Maternal Grandmother   . Cancer Maternal Grandmother     MGGM colon  . Colon cancer Maternal Grandfather   . Cancer Maternal Grandfather     colon  . Cancer Paternal Grandmother     multiple myeloma  . Diabetes Paternal Grandfather   . Hypertension Father   . Hyperlipidemia Father   . Heart disease Cousin   . Cancer Cousin     lung    History   Social History  . Marital Status: Single    Spouse Name: N/A    Number of Children: 2  . Years of Education: N/A   Occupational History  . cpa    Social History Main Topics  . Smoking status: Never Smoker   . Smokeless tobacco: Never Used  . Alcohol Use: Yes     Comment: Social  . Drug Use: No  . Sexual Activity: Yes    Birth Control/ Protection: None   Other Topics Concern  . Not on file   Social History Narrative   Single.  Lives alone.  2 teenage children.    Review of Systems: Gen: Denies any fever, chills, sweats, anorexia, fatigue, weakness, malaise, weight loss, and sleep disorder CV: Denies chest pain, angina, palpitations, syncope, orthopnea, PND, peripheral edema, and claudication. Resp: Denies dyspnea at rest, dyspnea with exercise, cough, sputum, wheezing, coughing up blood, and pleurisy. GI: Denies vomiting blood, jaundice, and fecal incontinence.   Denies dysphagia or odynophagia. GU : Denies urinary burning, blood in urine, urinary frequency, urinary hesitancy, nocturnal urination, and urinary incontinence. MS: Denies joint pain, limitation of movement, and swelling, stiffness, low back pain, extremity pain. Denies muscle weakness, cramps, atrophy.  Derm: Denies rash, itching, dry skin, hives, moles, warts, or  unhealing ulcers.  Psych: Denies depression, anxiety, memory loss, suicidal ideation, hallucinations, paranoia, and confusion. Heme: Denies bruising, bleeding, and enlarged lymph nodes. Neuro:  Denies any headaches, dizziness, paresthesias. Endo:  Denies any problems with DM, thyroid, adrenal function.  Physical Exam: Vital signs in last 24 hours: Temp:  [97.9 F (36.6 C)-98 F (36.7 C)] 97.9 F (36.6 C) (10/09 0417) Pulse Rate:  [60] 60 (10/09 0417) Resp:  [18] 18 (10/09 0417) BP: (107-127)/(61-73) 107/61 mmHg (10/09 0417) SpO2:  [100 %] 100 % (10/09 0417) Last BM Date: 03/03/14 General:   Alert,  Well-developed, well-nourished, pleasant and cooperative in NAD Head:  Normocephalic and atraumatic. Eyes:  Sclera clear, no icterus.   Conjunctiva pink. Ears:  Normal auditory acuity. Nose:  No deformity, discharge,  or lesions. Mouth:  No deformity or lesions.   Neck:  Supple; no masses or thyromegaly. Lungs:  Clear throughout to auscultation.   No wheezes, crackles, or rhonchi Heart:  Regular rate and rhythm; no murmurs, clicks, rubs,  or gallops. Abdomen:  Soft,mild diffuse tenderness, BS active,nonpalp mass or hsm.   Rectal:  Deferred  Msk:  Symmetrical without gross deformities. . Pulses:  Normal pulses noted. Extremities:  Without clubbing or edema. Neurologic:  Alert and  oriented x4;  grossly normal neurologically. Skin:  Intact without significant lesions or rashes.. Psych:  Alert and cooperative. Normal mood and affect.  Lab Results:  Recent Labs  03/03/14 1300 03/04/14 0405  WBC 6.3 5.6  HGB 12.5 11.7*  HCT 36.3 34.7*  PLT 204 209   BMET  Recent Labs  03/03/14 1553 03/04/14 0405  NA 140 139    K 4.0 4.1  CL 100 106  CO2 29 28  GLUCOSE 96 89  BUN 9 8  CREATININE 1.01 1.03  CALCIUM 9.0 8.6   LFT  Recent Labs  03/03/14 1553  PROT 6.8  ALBUMIN 3.6  AST 15  ALT 12  ALKPHOS 51  BILITOT 0.4   Lipase 20 on 03/03/14.  Studies/Results: Ct Abdomen  Pelvis W Contrast  03/04/2014   CLINICAL DATA:  Lower abdominal pain centrally radiating to back with nausea, anorexia/ weight loss and fatigue 4-5 weeks as well as chronic constipation. Bloody stools.  EXAM: CT ABDOMEN AND PELVIS WITH CONTRAST  TECHNIQUE: Multidetector CT imaging of the abdomen and pelvis was performed using the standard protocol following bolus administration of intravenous contrast.  CONTRAST:  100mL OMNIPAQUE IOHEXOL 300 MG/ML  SOLN  COMPARISON:  04/16/2004  FINDINGS: The lung bases are within normal.  Abdominal images demonstrate mild diffuse hepatic steatosis. There are 2 sub cm hypodensities of the spleen likely hemangiomas or cysts. The pancreas, gallbladder and adrenal glands are within normal. Kidneys are normal in size without hydronephrosis or nephrolithiasis. Ureters are within normal. There is minimal calcified plaque over the distal abdominal aorta which is otherwise normal caliber. The appendix is normal. There are a few small nonspecific sub cm periaortic lymph nodes. There is no free fluid or focal inflammatory change. There is decompression of the proximal descending as well as sigmoid colon. Borderline wall thickening of the distal stomach.  Pelvic images demonstrate the uterus to be right of midline as the uterus, ovaries, bladder and rectum are within normal. Small cystic collection in the region of the cervix likely a nabothian cyst. Small amount fluid in the cul-de-sac. Mild degenerate changes spine with disc disease at the L4-5 level.  IMPRESSION: No acute findings in the abdomen/ pelvis.  Borderline wall thickening of the distal stomach which may be due to spasm/ peristalsis although may be seen with gastritis.  Two small subcentimeter splenic hypodensities likely cysts or hemangiomas.   Electronically Signed   By: Daniel  Boyle M.D.   On: 03/04/2014 11:40   PROCEDURES: COLONOSCOPY 11/25/11: -melanosis -no polyps or cancers -otherwise normal REC: trial of linzess 145  mcg qd and prn use of miralax  EGD 05/17/13: -evidence of suspected barrett's -mucosa of stomach normal   IMPRESSION/PLAN:  1. Abdominal pain, heme positive stools in pt with known Barrett's, GERD and hiatal hernia. Recent CT with thickening of wall of stomach. Continue BID PPI. Will plan on EGD to eval for gastritis, ulcer, etc. as she has had post prandial nausea, vomitng, pain after meals, will obtain ultrasound to eval gallbladder. Her symptoms may be functional especially in light of the fact that she has IBS as well. Further recommendations to be made pending findings of EGD and abd ultrasound.  2. IBS-Continue linzess.  3. Sleep disorder. Continue trazadone and ativan prn.    Hvozdovic, Lori P PA-C 03/04/2014,  Pager [phone]     Attending physician's note   I have taken a history, examined the patient and reviewed the chart. I agree with the Advanced Practitioner's note, impression and recommendations. Abd pain, heme + dark stools, thickened gastric wall on CT. PPI BID for now. EGD today and abd US tomorrow.   Datron Brakebill T Amada Hallisey, MD FACG 

## 2014-03-04 NOTE — H&P (View-Only) (Signed)
Referring Provider: [facility] Primary Care Physician:  Alesia Richards, MD Primary Gastroenterologist:  Dr. Sharlett Iles  Reason for Consultation:  Abdominal pain, heme positive stools      HPI: Pamela Welch is a 48 y.o. female who had been followed by Dr Sharlett Iles for acid reflus, Barrett's and IBS-C with melanosis coli.Colonoscopy in 2013 showed severe melanosis, and EGD in 2014 showed Barretts. Since she was last seen in January, she has been on dexilant. She has no further nocturnal cough, but she does get breakthrough heartburn 2-3 days per week. Over the past month or so, she says she has to sit up after meals to let her food pass. She does not feel food gets stuck in her esophagus, but she feels it sits in her stomach for a long time before it passes through. She has also been on linzess 145 mcg qd with miralx 2-3 times per week and was having a daily BM. However, approximately 4-5 weeks ago, she feels she had a "stomach bug" and began to have nausea, vomiting and diarrhea. These symptoms have mostly subsided, but over the past 2 weeks she has mid abdominal pain (she points to epigastric area) after meals, associated with nausea and vomitng.The pain radiates around to the back on both sides, and at times is so severe she breaks out in a sweat. She has also had diarrhea alternating with formed stools, but has had more cramping with defecation. She says the pain was so severe on 2 occasions in the past week that she almost passed out. She came to the ER on 10/8 and had an abd CT earlier today that showed thickening of the distal stomach.She says she has had black stools on several occasion over the past 1-1 1/2 weeks, and her stools were positive for blood on admission. She denies use of NSAIDS, but says she takes a baby aspirin 2-3 times per week.She has had one episode on a small amount red blood on toilet tissue as well. LMP 2 weeks ago, normal.   Past Medical History  Diagnosis  Date  . Chest pain, unspecified     normal myoview 2000;  echo 5/10:  mild LVH, EF 60-65%  . Palpitations     h/o symptomatic PVCs  . Restless legs syndrome (RLS)   . Obstructive sleep apnea (adult) (pediatric)   . Persistent disorder of initiating or maintaining sleep   . Anxiety   . Herniated disc   . Depression   . Hyperlipidemia   . Back injury   . Barrett's esophagus   . Lumbago   . Insomnia   . HSV-1 (herpes simplex virus 1) infection   . DDD (degenerative disc disease)   . Palpitations   . Hypertension     PT. DENIES  . Melanosis coli     Past Surgical History  Procedure Laterality Date  . Tubes in ears      as an adult  . Refractive surgery      bilateral eyes   . Eye surgery  2001  . Colonoscopy  2013    Prior to Admission medications   Medication Sig Start Date End Date Taking? Authorizing Provider  aspirin 81 MG tablet Take 81 mg by mouth daily.     Yes Historical Provider, MD  buPROPion (WELLBUTRIN XL) 300 MG 24 hr tablet Take 300 mg by mouth every morning. For mood   Yes Historical Provider, MD  dexlansoprazole (DEXILANT) 60 MG capsule Take 1 capsule (60 mg total)  by mouth daily. 09/13/13  Yes Melissa R Smith, PA-C  fluticasone (FLONASE) 50 MCG/ACT nasal spray Place 2 sprays into both nostrils daily.   Yes Historical Provider, MD  ipratropium (ATROVENT) 0.06 % nasal spray Place 2 sprays into both nostrils 2 (two) times daily.   Yes Historical Provider, MD  Linaclotide Rolan Lipa) 145 MCG CAPS capsule Take 1 capsule (145 mcg total) by mouth daily. 09/13/13  Yes Melissa R Smith, PA-C  LORazepam (ATIVAN) 2 MG tablet Take 1-2 mg by mouth at bedtime as needed (for sleep).   Yes Historical Provider, MD  montelukast (SINGULAIR) 10 MG tablet Take 10 mg by mouth at bedtime.   Yes Historical Provider, MD  rosuvastatin (CRESTOR) 20 MG tablet Take 20 mg by mouth daily. evening   Yes Historical Provider, MD  trazodone (DESYREL) 300 MG tablet Take 150-300 mg by mouth 3  times/day as needed-between meals & bedtime for sleep.   Yes Historical Provider, MD    Current Facility-Administered Medications  Medication Dose Route Frequency Provider Last Rate Last Dose  . 0.9 %  sodium chloride infusion   Intravenous Continuous Venetia Maxon Rama, MD 50 mL/hr at 03/03/14 1300    . acetaminophen (TYLENOL) tablet 650 mg  650 mg Oral Q6H PRN Venetia Maxon Rama, MD       Or  . acetaminophen (TYLENOL) suppository 650 mg  650 mg Rectal Q6H PRN Christina P Rama, MD      . buPROPion (WELLBUTRIN XL) 24 hr tablet 300 mg  300 mg Oral Daily Venetia Maxon Rama, MD   300 mg at 03/04/14 1155  . gi cocktail (Maalox,Lidocaine,Donnatal)  30 mL Oral TID PRN Venetia Maxon Rama, MD      . Linaclotide (LINZESS) capsule 145 mcg  145 mcg Oral Daily Venetia Maxon Rama, MD   145 mcg at 03/04/14 1155  . LORazepam (ATIVAN) tablet 1-2 mg  1-2 mg Oral QHS PRN Venetia Maxon Rama, MD   2 mg at 03/03/14 2141  . montelukast (SINGULAIR) tablet 10 mg  10 mg Oral QHS Christina P Rama, MD      . ondansetron (ZOFRAN) tablet 4 mg  4 mg Oral Q6H PRN Christina P Rama, MD       Or  . ondansetron (ZOFRAN) injection 4 mg  4 mg Intravenous Q6H PRN Christina P Rama, MD      . oxyCODONE (Oxy IR/ROXICODONE) immediate release tablet 5 mg  5 mg Oral Q4H PRN Christina P Rama, MD      . pantoprazole (PROTONIX) injection 40 mg  40 mg Intravenous Q12H Venetia Maxon Rama, MD   40 mg at 03/04/14 1155  . rosuvastatin (CRESTOR) tablet 20 mg  20 mg Oral Daily Venetia Maxon Rama, MD   20 mg at 03/04/14 1155  . traZODone (DESYREL) tablet 150-300 mg  150-300 mg Oral BID BM & HS PRN Venetia Maxon Rama, MD   150 mg at 03/03/14 2141    Allergies as of 03/03/2014 - Review Complete 03/03/2014  Allergen Reaction Noted  . Ambien [zolpidem tartrate]  04/14/2013  . Atenolol  04/14/2013  . Cholestatin  05/06/2011  . Mirapex [pramipexole dihydrochloride]  04/14/2013  . Requip [ropinirole hcl]  04/14/2013    Family History  Problem Relation Age of  Onset  . Hypertension Mother   . Osteoporosis Mother   . Breast cancer Sister 19    BRCA NEG/BILAT MASTECTOMY/CHEMO X 1 YR  . Hyperlipidemia Sister   . Cancer Sister     breast/ ovary  .  Ovarian cancer Neg Hx   . Colon cancer Maternal Grandmother   . Stroke Maternal Grandmother   . Osteoporosis Maternal Grandmother   . Cancer Maternal Grandmother     MGGM colon  . Colon cancer Maternal Grandfather   . Cancer Maternal Grandfather     colon  . Cancer Paternal Grandmother     multiple myeloma  . Diabetes Paternal Grandfather   . Hypertension Father   . Hyperlipidemia Father   . Heart disease Cousin   . Cancer Cousin     lung    History   Social History  . Marital Status: Single    Spouse Name: N/A    Number of Children: 2  . Years of Education: N/A   Occupational History  . cpa    Social History Main Topics  . Smoking status: Never Smoker   . Smokeless tobacco: Never Used  . Alcohol Use: Yes     Comment: Social  . Drug Use: No  . Sexual Activity: Yes    Birth Control/ Protection: None   Other Topics Concern  . Not on file   Social History Narrative   Single.  Lives alone.  2 teenage children.    Review of Systems: Gen: Denies any fever, chills, sweats, anorexia, fatigue, weakness, malaise, weight loss, and sleep disorder CV: Denies chest pain, angina, palpitations, syncope, orthopnea, PND, peripheral edema, and claudication. Resp: Denies dyspnea at rest, dyspnea with exercise, cough, sputum, wheezing, coughing up blood, and pleurisy. GI: Denies vomiting blood, jaundice, and fecal incontinence.   Denies dysphagia or odynophagia. GU : Denies urinary burning, blood in urine, urinary frequency, urinary hesitancy, nocturnal urination, and urinary incontinence. MS: Denies joint pain, limitation of movement, and swelling, stiffness, low back pain, extremity pain. Denies muscle weakness, cramps, atrophy.  Derm: Denies rash, itching, dry skin, hives, moles, warts, or  unhealing ulcers.  Psych: Denies depression, anxiety, memory loss, suicidal ideation, hallucinations, paranoia, and confusion. Heme: Denies bruising, bleeding, and enlarged lymph nodes. Neuro:  Denies any headaches, dizziness, paresthesias. Endo:  Denies any problems with DM, thyroid, adrenal function.  Physical Exam: Vital signs in last 24 hours: Temp:  [97.9 F (36.6 C)-98 F (36.7 C)] 97.9 F (36.6 C) (10/09 0417) Pulse Rate:  [60] 60 (10/09 0417) Resp:  [18] 18 (10/09 0417) BP: (107-127)/(61-73) 107/61 mmHg (10/09 0417) SpO2:  [100 %] 100 % (10/09 0417) Last BM Date: 03/03/14 General:   Alert,  Well-developed, well-nourished, pleasant and cooperative in NAD Head:  Normocephalic and atraumatic. Eyes:  Sclera clear, no icterus.   Conjunctiva pink. Ears:  Normal auditory acuity. Nose:  No deformity, discharge,  or lesions. Mouth:  No deformity or lesions.   Neck:  Supple; no masses or thyromegaly. Lungs:  Clear throughout to auscultation.   No wheezes, crackles, or rhonchi Heart:  Regular rate and rhythm; no murmurs, clicks, rubs,  or gallops. Abdomen:  Soft,mild diffuse tenderness, BS active,nonpalp mass or hsm.   Rectal:  Deferred  Msk:  Symmetrical without gross deformities. . Pulses:  Normal pulses noted. Extremities:  Without clubbing or edema. Neurologic:  Alert and  oriented x4;  grossly normal neurologically. Skin:  Intact without significant lesions or rashes.. Psych:  Alert and cooperative. Normal mood and affect.  Lab Results:  Recent Labs  03/03/14 1300 03/04/14 0405  WBC 6.3 5.6  HGB 12.5 11.7*  HCT 36.3 34.7*  PLT 204 209   BMET  Recent Labs  03/03/14 1553 03/04/14 0405  NA 140 139  K 4.0 4.1  CL 100 106  CO2 29 28  GLUCOSE 96 89  BUN 9 8  CREATININE 1.01 1.03  CALCIUM 9.0 8.6   LFT  Recent Labs  03/03/14 1553  PROT 6.8  ALBUMIN 3.6  AST 15  ALT 12  ALKPHOS 51  BILITOT 0.4   Lipase 20 on 03/03/14.  Studies/Results: Ct Abdomen  Pelvis W Contrast  03/04/2014   CLINICAL DATA:  Lower abdominal pain centrally radiating to back with nausea, anorexia/ weight loss and fatigue 4-5 weeks as well as chronic constipation. Bloody stools.  EXAM: CT ABDOMEN AND PELVIS WITH CONTRAST  TECHNIQUE: Multidetector CT imaging of the abdomen and pelvis was performed using the standard protocol following bolus administration of intravenous contrast.  CONTRAST:  132m OMNIPAQUE IOHEXOL 300 MG/ML  SOLN  COMPARISON:  04/16/2004  FINDINGS: The lung bases are within normal.  Abdominal images demonstrate mild diffuse hepatic steatosis. There are 2 sub cm hypodensities of the spleen likely hemangiomas or cysts. The pancreas, gallbladder and adrenal glands are within normal. Kidneys are normal in size without hydronephrosis or nephrolithiasis. Ureters are within normal. There is minimal calcified plaque over the distal abdominal aorta which is otherwise normal caliber. The appendix is normal. There are a few small nonspecific sub cm periaortic lymph nodes. There is no free fluid or focal inflammatory change. There is decompression of the proximal descending as well as sigmoid colon. Borderline wall thickening of the distal stomach.  Pelvic images demonstrate the uterus to be right of midline as the uterus, ovaries, bladder and rectum are within normal. Small cystic collection in the region of the cervix likely a nabothian cyst. Small amount fluid in the cul-de-sac. Mild degenerate changes spine with disc disease at the L4-5 level.  IMPRESSION: No acute findings in the abdomen/ pelvis.  Borderline wall thickening of the distal stomach which may be due to spasm/ peristalsis although may be seen with gastritis.  Two small subcentimeter splenic hypodensities likely cysts or hemangiomas.   Electronically Signed   By: DMarin OlpM.D.   On: 03/04/2014 11:40   PROCEDURES: COLONOSCOPY 11/25/11: -melanosis -no polyps or cancers -otherwise normal REC: trial of linzess 145  mcg qd and prn use of miralax  EGD 05/17/13: -evidence of suspected barrett's -mucosa of stomach normal   IMPRESSION/PLAN:  1. Abdominal pain, heme positive stools in pt with known Barrett's, GERD and hiatal hernia. Recent CT with thickening of wall of stomach. Continue BID PPI. Will plan on EGD to eval for gastritis, ulcer, etc. as she has had post prandial nausea, vomitng, pain after meals, will obtain ultrasound to eval gallbladder. Her symptoms may be functional especially in light of the fact that she has IBS as well. Further recommendations to be made pending findings of EGD and abd ultrasound.  2. IBS-Continue linzess.  3. Sleep disorder. Continue trazadone and ativan prn.    Hvozdovic, LDeloris Ping10/01/2014,  Pager [phone]    Attending physician's note   I have taken a history, examined the patient and reviewed the chart. I agree with the Advanced Practitioner's note, impression and recommendations. Abd pain, heme + dark stools, thickened gastric wall on CT. PPI BID for now. EGD today and abd [place].   MLadene Artist MD FMarval Regal

## 2014-03-04 NOTE — Progress Notes (Signed)
INITIAL NUTRITION ASSESSMENT  DOCUMENTATION CODES Per approved criteria  -Not Applicable   INTERVENTION: -Encouraged intake of cold/less odorous bland foods to assist with nausea. Continue with anti-emetics -To comply with Barrett's esophagus recommended diet; encouraged pt to minimize intake of fried foods, caffeine, chocolate, and spicy foods -Consider addition of Carnation Instant Breakfast w/skim milk as diet advancement tolerated if PO intake decreases to <75% -Diet advancement per MD -RD to continue to monitor  NUTRITION DIAGNOSIS: Inadequate oral intake related to nausea/abd pain/reflux as evidenced by decreased PO intake, 7 lb wt loss in one month.   Goal: Pt to meet >/= 90% of their estimated nutrition needs    Monitor:  Diet order, total protein/energy intake, labs, weights, GI profile, diet education needs  Reason for Assessment: MST  48 y.o. female  Admitting Dx: GI bleed  ASSESSMENT: Pamela Welch is an 48 y.o. female with a PMH of Barrett's esophagus, melanosis coli status post colonoscopy 11/25/11, chronic constipation managed with Linzess, moderate hiatal hernia status post EGD 05/17/13, on chronic Dexilant, who presents with a 4-5 week history of abdominal pain, nausea, anorexia, weight loss, fatigue. Symptoms improved slightly after a week or so (she attributed it to a stomach virus), but then she developed severe abdominal pain, at times sharp, colicky in nature over the past 24 hours  -Pt endorsed an unintentional wt loss of 7 lbs in past one month (4% body weight loss, non-significant for time frame) -D/t hx of melanosis coli, pt has difficulty with regular bowel movements. Experiences periods of constipation, and then frequent episodes of loose stools d/t use of enemas -Pt also experiences difficulty with reflux, which inhibits PO intake. Pt noted to have a bland diet for past one month (plain chicken salad, dry Wheaties cereal) however continues to have reflux  symptoms of burning in her esophagus and coughing up sputum like material -Endorsed two weeks of decreased intake d/t reasons explained above. Was feeling better, and appetite began improving to baseline (pt reported following healthy diet-fruits, vegetables, lean proteins); however felt extreme nausea and abd pain after eating piece of meat -Pt tolerating clear liquid diet w/out nausea or abd pain. Receiving protonix and Zofran -Reviewed diet recommendations for nausea and Barrett's esophagus. Pt verbalized understanding, and will review education once pt diet advances to solid foods. May benefit from low fat nutrition supplement, such as [employer], if PO intake continues to be decreased from baseline  Height: Ht Readings from Last 1 Encounters:  03/03/14 5\' 9"  (1.753 m)    Weight: Wt Readings from Last 1 Encounters:  03/03/14 150 lb (68.04 kg)    Ideal Body Weight: 145 lb  % Ideal Body Weight: 103%  Wt Readings from Last 10 Encounters:  03/03/14 150 lb (68.04 kg)  09/13/13 154 lb (69.854 kg)  06/15/13 156 lb 8 oz (70.988 kg)  05/18/13 155 lb (70.308 kg)  05/17/13 150 lb (68.04 kg)  05/11/13 150 lb (68.04 kg)  04/15/13 151 lb (68.493 kg)  05/14/12 145 lb (65.772 kg)  11/25/11 145 lb (65.772 kg)  11/15/11 145 lb 9.6 oz (66.044 kg)    Usual Body Weight: 150-155 lb  % Usual Body Weight: 100%  BMI:  Body mass index is 22.14 kg/(m^2).  Estimated Nutritional Needs: Kcal: 1700-1900 Protein: 70-80 gram Fluid: >/=1700 ml daily  Skin: WDL  Diet Order: Clear Liquid  EDUCATION NEEDS: -Education needs addressed   Intake/Output Summary (Last 24 hours) at 03/04/14 1439 Last data filed at 03/04/14 1336  Gross per 24 hour  Intake    600 ml  Output      0 ml  Net    600 ml    Last BM: 10/08   Labs:   Recent Labs Lab 03/03/14 1553 03/04/14 0405  NA 140 139  K 4.0 4.1  CL 100 106  CO2 29 28  BUN 9 8  CREATININE 1.01 1.03  CALCIUM 9.0 8.6   GLUCOSE 96 89    CBG (last 3)  No results found for this basename: GLUCAP,  in the last 72 hours  Scheduled Meds: . buPROPion  300 mg Oral Daily  . Linaclotide  145 mcg Oral Daily  . montelukast  10 mg Oral QHS  . pantoprazole (PROTONIX) IV  40 mg Intravenous Q12H  . rosuvastatin  20 mg Oral Daily    Continuous Infusions: . sodium chloride 50 mL/hr at 03/03/14 1300    Past Medical History  Diagnosis Date  . Chest pain, unspecified     normal myoview 2000;  echo 5/10:  mild LVH, EF 60-65%  . Palpitations     h/o symptomatic PVCs  . Restless legs syndrome (RLS)   . Obstructive sleep apnea (adult) (pediatric)   . Persistent disorder of initiating or maintaining sleep   . Anxiety   . Herniated disc   . Depression   . Hyperlipidemia   . Back injury   . Barrett's esophagus   . Lumbago   . Insomnia   . HSV-1 (herpes simplex virus 1) infection   . DDD (degenerative disc disease)   . Palpitations   . Hypertension     PT. DENIES  . Melanosis coli     Past Surgical History  Procedure Laterality Date  . Tubes in ears      as an adult  . Refractive surgery      bilateral eyes   . Eye surgery  2001  . Colonoscopy  2013    Atlee Abide Kemah Cade Clinical Dietitian [phone]

## 2014-03-04 NOTE — Progress Notes (Signed)
UR completed 

## 2014-03-04 NOTE — Interval H&P Note (Signed)
History and Physical Interval Note:  03/04/2014 3:20 PM  Pamela Welch  has presented today for surgery, with the diagnosis of epigastric pain  The various methods of treatment have been discussed with the patient and family. After consideration of risks, benefits and other options for treatment, the patient has consented to  Procedure(s): ESOPHAGOGASTRODUODENOSCOPY (EGD) (N/A) as a surgical intervention .  The patient's history has been reviewed, patient examined, no change in status, stable for surgery.  I have reviewed the patient's chart and labs.  Questions were answered to the patient's satisfaction.     Pricilla Riffle. Fuller Plan MD

## 2014-03-04 NOTE — Progress Notes (Addendum)
Progress Note   [name] VQM:[id number] DOB: 08-19-1965 DOA: 03/03/2014 PCP: Alesia Richards, MD   Brief Narrative:   Pamela Welch is an 48 y.o. female with a PMH of Barrett's esophagus, melanosis coli status post colonoscopy 11/25/11, chronic constipation managed with Linzess, moderate hiatal hernia status post EGD 05/17/13, on chronic Dexilant, who was admitted 03/03/14 the chief complaint of 4-5 weeks of intermittent abdominal pain associated with nausea, anorexia, weight loss and fatigue as well as black/bloody stools. Lab work essentially stable on admission.  Assessment/Plan:   Principal Problem:  GI bleed / Abdominal pain in a patient with a history of Barrett's esophagus / GERD / hiatal hernia   Hemoglobin stable on admission, not orthostatic. 0.8 g drop in hemoglobin overnight.  Stool heme +.   Continue BID PPI therapy.   CT abd/pelvis shows gastric thickening, but no colitis. GI consult for consideration of EGD given h/o Barrett's esophagus.  Continue GI cocktail and anti-emetics PRN, gentle IVF.   No evidence of cholecystitis, pancreatitis, UTI.  Preliminary H. Pylori serologies negative.  Active Problems:  Hyperlipidemia   Continue Crestor.  PERSISTENT DISORDER INITIATING/MAINTAINING SLEEP   Continue Trazodone, Ativan Q HS PRN.  Constipation / melanosis coli   Chronic issue with regular laxative use. Continue Linzess.  DVT prophylaxis   SCDs.  Code Status: Full.  Family Communication: Patient and friend at bedside.  Disposition Plan: Home when stable.    IV Access:    Peripheral IV   Procedures and diagnostic studies:   Ct Abdomen Pelvis W Contrast 03/04/2014: No acute findings in the abdomen/ pelvis.  Borderline wall thickening of the distal stomach which may be due to spasm/ peristalsis although may be seen with gastritis.  Two small subcentimeter splenic hypodensities likely cysts or hemangiomas.     Medical Consultants:     Gastroenterology   Other Consultants:    None.   Anti-Infectives:    None.  Subjective:   Pamela Welch feels better today.  No nausea since admission.  No abdominal pain.  Thinks the Zofran and not eating solid foods have helped.  No diarrhea or BMs since admission.  Objective:    Filed Vitals:   03/03/14 1300 03/03/14 2123 03/04/14 0417  BP: 102/69 127/73 107/61  Pulse: 94  60  Temp: 98 F (36.7 C) 98 F (36.7 C) 97.9 F (36.6 C)  TempSrc: Oral Oral Oral  Resp: 18 18 18   Height: 5\' 9"  (1.753 m)    Weight: 68.04 kg (150 lb)    SpO2: 100% 100% 100%    Intake/Output Summary (Last 24 hours) at 03/04/14 0804 Last data filed at 03/03/14 2125  Gross per 24 hour  Intake    600 ml  Output      0 ml  Net    600 ml    Exam: Gen:  NAD Cardiovascular:  RRR, No M/R/G Respiratory:  Lungs CTAB Gastrointestinal:  Abdomen soft, NT/ND, + BS Extremities:  No C/E/C   Data Reviewed:    Labs: Basic Metabolic Panel:  Recent Labs Lab 03/03/14 1553 03/04/14 0405  NA 140 139  K 4.0 4.1  CL 100 106  CO2 29 28  GLUCOSE 96 89  BUN 9 8  CREATININE 1.01 1.03  CALCIUM 9.0 8.6   GFR Estimated Creatinine Clearance: 69 ml/min (by C-G formula based on Cr of 1.03). Liver Function Tests:  Recent Labs Lab 03/03/14 1553  AST 15  ALT 12  ALKPHOS 51  BILITOT 0.4  PROT 6.8  ALBUMIN 3.6    Recent Labs Lab 03/03/14 1300  LIPASE 20   CBC:  Recent Labs Lab 03/03/14 1300 03/04/14 0405  WBC 6.3 5.6  NEUTROABS 3.9  --   HGB 12.5 11.7*  HCT 36.3 34.7*  MCV 87.5 88.5  PLT 204 209   Microbiology No results found for this or any previous visit (from the past 240 hour(s)).   Medications:   . buPROPion  300 mg Oral Daily  . iohexol  25 mL Oral Q1 Hr x 2  . Linaclotide  145 mcg Oral Daily  . montelukast  10 mg Oral QHS  . pantoprazole (PROTONIX) IV  40 mg Intravenous Q12H  . rosuvastatin  20 mg Oral Daily   Continuous Infusions: . sodium chloride 50  mL/hr at 03/03/14 1300    Time spent: 25 minutes.   LOS: 1 day   Belvia Gotschall  [facility] Pager [phone]. If unable to reach me by pager, please call my cell phone at [phone].  *Please refer to [email or web address], password [email or web address] to get updated schedule on who will round on this patient, as hospitalists switch teams weekly. If 7PM-7AM, please contact night-coverage at [email or web address], password [email or web address] for any overnight needs.  03/04/2014, 8:04 AM

## 2014-03-05 ENCOUNTER — Observation Stay (HOSPITAL_COMMUNITY): Payer: BC Managed Care – PPO

## 2014-03-05 ENCOUNTER — Other Ambulatory Visit: Payer: Self-pay | Admitting: Physician Assistant

## 2014-03-05 DIAGNOSIS — K2270 Barrett's esophagus without dysplasia: Secondary | ICD-10-CM | POA: Diagnosis not present

## 2014-03-05 DIAGNOSIS — R195 Other fecal abnormalities: Secondary | ICD-10-CM

## 2014-03-05 DIAGNOSIS — R933 Abnormal findings on diagnostic imaging of other parts of digestive tract: Secondary | ICD-10-CM

## 2014-03-05 DIAGNOSIS — E785 Hyperlipidemia, unspecified: Secondary | ICD-10-CM

## 2014-03-05 DIAGNOSIS — R1013 Epigastric pain: Secondary | ICD-10-CM

## 2014-03-05 LAB — COMPREHENSIVE METABOLIC PANEL
ALT: 10 U/L (ref 0–35)
AST: 12 U/L (ref 0–37)
Albumin: 3.3 g/dL — ABNORMAL LOW (ref 3.5–5.2)
Alkaline Phosphatase: 48 U/L (ref 39–117)
Anion gap: 10 (ref 5–15)
BUN: 6 mg/dL (ref 6–23)
CALCIUM: 8.8 mg/dL (ref 8.4–10.5)
CO2: 28 mEq/L (ref 19–32)
Chloride: 104 mEq/L (ref 96–112)
Creatinine, Ser: 0.99 mg/dL (ref 0.50–1.10)
GFR calc non Af Amer: 66 mL/min — ABNORMAL LOW (ref >90)
GFR, EST AFRICAN AMERICAN: 76 mL/min — AB (ref >90)
GLUCOSE: 90 mg/dL (ref 70–99)
Potassium: 3.9 mEq/L (ref 3.7–5.3)
SODIUM: 142 meq/L (ref 137–147)
TOTAL PROTEIN: 6.1 g/dL (ref 6.0–8.3)
Total Bilirubin: 0.5 mg/dL (ref 0.3–1.2)

## 2014-03-05 LAB — CBC
HCT: 34.8 % — ABNORMAL LOW (ref 36.0–46.0)
Hemoglobin: 11.8 g/dL — ABNORMAL LOW (ref 12.0–15.0)
MCH: 29.7 pg (ref 26.0–34.0)
MCHC: 33.9 g/dL (ref 30.0–36.0)
MCV: 87.7 fL (ref 78.0–100.0)
Platelets: 214 10*3/uL (ref 150–400)
RBC: 3.97 MIL/uL (ref 3.87–5.11)
RDW: 11.6 % (ref 11.5–15.5)
WBC: 5.2 10*3/uL (ref 4.0–10.5)

## 2014-03-05 MED ORDER — PANTOPRAZOLE SODIUM 40 MG PO TBEC: 40.0000 mg | DELAYED_RELEASE_TABLET | Freq: Two times a day (BID) | ORAL | Status: DC

## 2014-03-05 MED ORDER — HYOSCYAMINE SULFATE 0.125 MG SL SUBL: 0.1250 mg | SUBLINGUAL_TABLET | SUBLINGUAL | Status: DC | PRN

## 2014-03-05 MED ORDER — POLYETHYLENE GLYCOL 3350 17 G PO PACK: 17.0000 g | PACK | Freq: Two times a day (BID) | ORAL | Status: DC | PRN

## 2014-03-05 MED ORDER — FAMOTIDINE 40 MG PO TABS: 40.0000 mg | ORAL_TABLET | Freq: Every day | ORAL | Status: DC

## 2014-03-05 MED ORDER — ONDANSETRON 4 MG PO TBDP: 4.0000 mg | ORAL_TABLET | Freq: Three times a day (TID) | ORAL | Status: DC | PRN

## 2014-03-05 MED ORDER — DEXLANSOPRAZOLE 60 MG PO CPDR: 60.0000 mg | DELAYED_RELEASE_CAPSULE | Freq: Every day | ORAL | Status: DC

## 2014-03-05 NOTE — Progress Notes (Signed)
[facility] Progress Note  Subjective:  Feels much better today. No pain. No nausea or vomiting. Abd ultrasound normal. EGD with Barrett's, otherwise normal.  Objective:  Vital signs in last 24 hours: Temp:  [97.4 F (36.3 C)-98 F (36.7 C)] 97.9 F (36.6 C) (10/10 0537) Pulse Rate:  [59-90] 60 (10/10 0537) Resp:  [11-22] 20 (10/10 0537) BP: (113-156)/(59-116) 124/60 mmHg (10/10 0537) SpO2:  [100 %] 100 % (10/10 0537) Last BM Date: 03/04/14 General:   Alert,  Well-developed, white female in NAD Heart:  Regular rate and rhythm; no murmurs Pulm lungs clear Abdomen:  Soft, nontender and nondistended. Normal bowel sounds, without guarding, and without rebound.   Extremities:  Without edema. Neurologic:  Alert and  oriented x4;  grossly normal neurologically. Psych:  Alert and cooperative. Normal mood and affect.    Lab Results:  Recent Labs  03/03/14 1300 03/04/14 0405 03/05/14 0408  WBC 6.3 5.6 5.2  HGB 12.5 11.7* 11.8*  HCT 36.3 34.7* 34.8*  PLT 204 209 214   BMET  Recent Labs  03/03/14 1553 03/04/14 0405 03/05/14 0408  NA 140 139 142  K 4.0 4.1 3.9  CL 100 106 104  CO2 29 28 28   GLUCOSE 96 89 90  BUN 9 8 6   CREATININE 1.01 1.03 0.99  CALCIUM 9.0 8.6 8.8   LFT  Recent Labs  03/05/14 0408  PROT 6.1  ALBUMIN 3.3*  AST 12  ALT 10  ALKPHOS 48  BILITOT 0.5    US Abdomen Complete  03/05/2014   CLINICAL DATA:  Four week history of postprandial abdominal pain and nausea.  EXAM: ULTRASOUND ABDOMEN COMPLETE  COMPARISON:  CT abdomen and pelvis yesterday.  FINDINGS: Gallbladder: No shadowing gallstones or echogenic sludge. No gallbladder wall thickening or pericholecystic fluid. Negative sonographic Murphy sign according to the ultrasound technologist.  Common bile duct: Diameter: Approximately 2 mm.  Liver: Normal size and echotexture without focal parenchymal abnormality. Patent portal vein with hepatopetal flow.  IVC: Patent.  Pancreas:  Normal size and echotexture without focal parenchymal abnormality.  Spleen: Normal size and echotexture without focal parenchymal abnormality.  Right Kidney: Length: Approximately 10.7 cm. No hydronephrosis. Well-preserved cortex. No shadowing calculi. Normal parenchymal echotexture. No focal parenchymal abnormality.  Left Kidney: Length: Approximately 11.2 cm. No hydronephrosis. Well-preserved cortex. No shadowing calculi. Normal parenchymal echotexture. No focal parenchymal abnormality.  Abdominal aorta: Normal in caliber throughout its visualized course in the abdomen without evidence of significant atherosclerosis. Maximum diameter 2.1 cm.  Other findings: None.  IMPRESSION: Normal examination.   Electronically Signed   By: Evangeline Dakin M.D.   On: 03/05/2014 09:03   Ct Abdomen Pelvis W Contrast  03/04/2014   CLINICAL DATA:  Lower abdominal pain centrally radiating to back with nausea, anorexia/ weight loss and fatigue 4-5 weeks as well as chronic constipation. Bloody stools.  EXAM: CT ABDOMEN AND PELVIS WITH CONTRAST  TECHNIQUE: Multidetector CT imaging of the abdomen and pelvis was performed using the standard protocol following bolus administration of intravenous contrast.  CONTRAST:  159mL OMNIPAQUE IOHEXOL 300 MG/ML  SOLN  COMPARISON:  04/16/2004  FINDINGS: The lung bases are within normal.  Abdominal images demonstrate mild diffuse hepatic steatosis. There are 2 sub cm hypodensities of the spleen likely hemangiomas or cysts. The pancreas, gallbladder and adrenal glands are within normal. Kidneys are normal in size without hydronephrosis or nephrolithiasis. Ureters are within normal. There is minimal calcified plaque over the distal abdominal aorta which is otherwise  normal caliber. The appendix is normal. There are a few small nonspecific sub cm periaortic lymph nodes. There is no free fluid or focal inflammatory change. There is decompression of the proximal descending as well as sigmoid colon.  Borderline wall thickening of the distal stomach.  Pelvic images demonstrate the uterus to be right of midline as the uterus, ovaries, bladder and rectum are within normal. Small cystic collection in the region of the cervix likely a nabothian cyst. Small amount fluid in the cul-de-sac. Mild degenerate changes spine with disc disease at the L4-5 level.  IMPRESSION: No acute findings in the abdomen/ pelvis.  Borderline wall thickening of the distal stomach which may be due to spasm/ peristalsis although may be seen with gastritis.  Two small subcentimeter splenic hypodensities likely cysts or hemangiomas.   Electronically Signed   By: Marin Olp M.D.   On: 03/04/2014 11:40    ASSESSMENT/PLAN:  1. 48 yo female admitted with abdominal pain and heme positive stools, s/p  Normal abd u/s and EGD that showed barrett's only. May need repeat colonoscopy and/or pill endo as outpatient. Diet advanced. If tol diet can likely go home. Should continue linzess. BID protonix and ranitidine at HS. Pt has an appt GI office on 10/21 with Dr Fuller Plan. Pt to have repeat cbc and cmet drawn 1 week before that appt.     LOS: 2 days   Hvozdovic, Deloris Ping 03/05/2014, Pager [phone]     Attending physician's note   I have taken an interval history, reviewed the chart and examined the patient. I agree with the Advanced Practitioner's note, impression and recommendations.   GERD/Barrett's: Protonix 40 mg po bid and famotidine 40 mg hs IBS-C: Linzess 145 mcg daily and Miralax BID prn Abd pain, etiology unclear, possible GERD or IBS-C. Avoid high fat foods Heme + stool, etiology unclear. Consider further evaluation as outpatient  Alphons Burgert T. Fuller Plan, MD [facility]

## 2014-03-05 NOTE — Discharge Summary (Signed)
Physician Discharge Summary  Pamela Welch IRJ:[id number] DOB: 31-Mar-1966 DOA: 03/03/2014  PCP: Alesia Richards, MD  Admit date: 03/03/2014 Discharge date: 03/05/2014   Recommendations for Outpatient Follow-Up:   1. Followup arranged with Dr. Fuller Plan for consideration of capsule endoscopy.   Discharge Diagnosis:   Principal Problem:    Abdominal pain of uncertain etiology in the setting of fecal occult-positive stools Active Problems:    PERSISTENT DISORDER INITIATING/MAINTAINING SLEEP    Constipation    Occult blood in stools    GERD (gastroesophageal reflux disease)    Barrett's esophagus    Hiatal hernia    Abnormal CT scan, stomach  Discharge Condition: Improved.  Diet recommendation: Regular.   History of Present Illness:   Pamela Welch is an 48 y.o. 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 Upon initial evaluation, the patient was noted to have fecal occult positive stool.   Hospital Course by Problem:   Principal Problem:  GI bleed / Abdominal pain in a patient with a history of Barrett's esophagus / GERD / hiatal hernia  Hemoglobin stable, and the patient was not orthostatic on admission.  Stool heme +.  Treated with BID PPI therapy. Discharged on twice a day Protonix and each bedtime Pepcid per GI recommendations. CT abd/pelvis done 03/04/14 with findings of an abnormally thickened distal stomach, and in the setting of known Barrett's esophagus, GI consultation was requested for consideration of EGD.  EGD performed 03/04/14 with no abnormalities appreciated. Abdominal ultrasound negative for gallbladder disease. LFTs WNL. Lipase WNL. Urinalysis/culture negative. Hemoglobin was stable  with no ongoing black/bloody stools. Patient will followup with GI for consideration for capsule endoscopy as an outpatient. Trial of Levsin for recurrent abdominal spasms.  Active Problems:  Hyperlipidemia  Continue Crestor.  PERSISTENT DISORDER INITIATING/MAINTAINING SLEEP  Continue Trazodone, Ativan Q HS PRN.  Constipation  Chronic issue. Continue Linzess. Patient was advised to use MiraLAX in place of sennosides for constipation control.     Medical Consultants:    Dr. Lucio Edward, Gastroenterology   Discharge Exam:   Filed Vitals:   03/05/14 0537  BP: 124/60  Pulse: 60  Temp: 97.9 F (36.6 C)  Resp: 20   Filed Vitals:   03/04/14 1600 03/04/14 1605 03/04/14 2027 03/05/14 0537  BP: 120/71  113/59 124/60  Pulse: 60 59 78 60  Temp:   97.4 F (36.3 C) 97.9 F (36.6 C)  TempSrc:   Oral Oral  Resp: 22 20 20 20   Height:      Weight:      SpO2: 100% 100% 100% 100%    Gen:  NAD Cardiovascular:  RRR, No M/R/G Respiratory: Lungs CTAB Gastrointestinal: Abdomen soft, NT/ND with normal active bowel sounds. Extremities: No C/E/C   The results of significant diagnostics from this hospitalization (including imaging, microbiology, ancillary and laboratory) are listed below for reference.     Procedures and Diagnostic Studies:   Ct Abdomen Pelvis W Contrast 03/04/2014: No acute findings in the abdomen/ pelvis. Borderline wall thickening of the distal stomach which may be due to spasm/ peristalsis although may be seen with gastritis. Two small subcentimeter splenic hypodensities likely cysts or hemangiomas.   US Abdomen Complete 03/05/2014: Normal examination.     EGD 03/04/14: Barrett's esophagus, otherwise no acute  abnormalities.  Labs:   Basic Metabolic Panel:  Recent Labs Lab 03/03/14 1553 03/04/14 0405 03/05/14 0408  NA 140 139 142  K 4.0 4.1 3.9  CL 100 106 104  CO2 29 28 28   GLUCOSE 96 89 90  BUN 9 8 6   CREATININE 1.01 1.03 0.99  CALCIUM 9.0  8.6 8.8   GFR Estimated Creatinine Clearance: 71.8 ml/min (by C-G formula based on Cr of 0.99). Liver Function Tests:  Recent Labs Lab 03/03/14 1553 03/05/14 0408  AST 15 12  ALT 12 10  ALKPHOS 51 48  BILITOT 0.4 0.5  PROT 6.8 6.1  ALBUMIN 3.6 3.3*    Recent Labs Lab 03/03/14 1300  LIPASE 20   CBC:  Recent Labs Lab 03/03/14 1300 03/04/14 0405 03/05/14 0408  WBC 6.3 5.6 5.2  NEUTROABS 3.9  --   --   HGB 12.5 11.7* 11.8*  HCT 36.3 34.7* 34.8*  MCV 87.5 88.5 87.7  PLT 204 209 214   Microbiology Recent Results (from the past 240 hour(s))  URINE CULTURE     Status: None   Collection Time    03/03/14  6:02 PM      Result Value Ref Range Status   Specimen Description URINE, CLEAN CATCH   Final   Special Requests NONE   Final   Culture  Setup Time     Final   Value: 03/03/2014 23:25     Performed at [facility] Count     Final   Value: NO GROWTH     Performed at [facility]   Culture     Final   Value: NO GROWTH     Performed at [facility]   Report Status 03/04/2014 FINAL   Final     Discharge Instructions:   Discharge Instructions   Activity as tolerated - No restrictions    Complete by:  As directed      Call MD for:  extreme fatigue    Complete by:  As directed      Call MD for:  persistant dizziness or light-headedness    Complete by:  As directed      Call MD for:  persistant nausea and vomiting    Complete by:  As directed      Call MD for:  severe uncontrolled pain    Complete by:  As directed      Call MD for:  temperature >100.4    Complete by:  As directed      Diet general    Complete by:  As directed      Discharge instructions    Complete by:  As directed   Avoid stimulant based laxatives (sennokot, sennosides).  Use MiraLAX daily as needed for constipation, increase to twice a day if needed.            Medication List    STOP taking these medications       aspirin 81 MG tablet      dexlansoprazole 60 MG capsule  Commonly known as:  DEXILANT      TAKE these medications       buPROPion 300 MG 24 hr tablet  Commonly known as:  WELLBUTRIN XL  Take 300 mg by mouth every morning. For mood     famotidine 40 MG tablet  Commonly known as:  PEPCID  Take 1 tablet (40 mg total) by mouth at bedtime.     fluticasone 50 MCG/ACT nasal spray  Commonly known as:  FLONASE  Place 2 sprays into both nostrils daily.     hyoscyamine 0.125 MG SL tablet  Commonly known as:  LEVSIN SL  Place 1 tablet (0.125 mg total) under the tongue every 4 (four) hours as needed.     ipratropium 0.06 % nasal spray  Commonly known as:  ATROVENT  Place 2 sprays into both nostrils 2 (two) times daily.     Linaclotide 145 MCG Caps capsule  Commonly known as:  LINZESS  Take 1 capsule (145 mcg total) by mouth daily.     LORazepam 2 MG tablet  Commonly known as:  ATIVAN  Take 1-2 mg by mouth at bedtime as needed (for sleep).     montelukast 10 MG tablet  Commonly known as:  SINGULAIR  Take 10 mg by mouth at bedtime.     ondansetron 4 MG disintegrating tablet  Commonly known as:  ZOFRAN ODT  Take 1 tablet (4 mg total) by mouth every 8 (eight) hours as needed for nausea or vomiting.     pantoprazole 40 MG tablet  Commonly known as:  PROTONIX  Take 1 tablet (40 mg total) by mouth 2 (two) times daily.     polyethylene glycol packet  Commonly known as:  MIRALAX / GLYCOLAX  Take 17 g by mouth 2 (two) times daily as needed.     rosuvastatin 20 MG tablet  Commonly known as:  CRESTOR  Take 20 mg by mouth daily. evening     trazodone 300 MG tablet  Commonly known as:  DESYREL  Take 150-300 mg by mouth 3 times/day as needed-between meals & bedtime for sleep.           Follow-up Information   Follow up with Norberto Sorenson T. Fuller Plan, MD. (03/16/14 at 3:45--be there 15 minutes early, bring all meds.)    Specialty:  Gastroenterology   Contact information:   [address] [place]  [address] [phone]       Schedule an appointment as soon as possible for a visit with Alesia Richards, MD. (If symptoms worsen)    Specialty:  Internal Medicine   Contact information:   [address] [place] [address] [phone]        Time coordinating discharge: 25 minutes.  Signed:  RAMA,CHRISTINA  Pager [phone] [facility] 03/05/2014, 2:34 PM

## 2014-03-05 NOTE — Discharge Instructions (Signed)
Please go to the lab in the [facility] building on 10/14 or 10/15 to have blood work done. (Please go before 4:00pm) Abdominal Pain Many things can cause abdominal pain. Usually, abdominal pain is not caused by a disease and will improve without treatment. It can often be observed and treated at home. Your health care provider will do a physical exam and possibly order blood tests and X-rays to help determine the seriousness of your pain. However, in many cases, more time must pass before a clear cause of the pain can be found. Before that point, your health care provider may not know if you need more testing or further treatment. HOME CARE INSTRUCTIONS  Monitor your abdominal pain for any changes. The following actions may help to alleviate any discomfort you are experiencing:  Only take over-the-counter or prescription medicines as directed by your health care provider.  Do not take laxatives unless directed to do so by your health care provider.  Try a clear liquid diet (broth, tea, or water) as directed by your health care provider. Slowly move to a bland diet as tolerated. SEEK MEDICAL CARE IF:  You have unexplained abdominal pain.  You have abdominal pain associated with nausea or diarrhea.  You have pain when you urinate or have a bowel movement.  You experience abdominal pain that wakes you in the night.  You have abdominal pain that is worsened or improved by eating food.  You have abdominal pain that is worsened with eating fatty foods.  You have a fever. SEEK IMMEDIATE MEDICAL CARE IF:   Your pain does not go away within 2 hours.  You keep throwing up (vomiting).  Your pain is felt only in portions of the abdomen, such as the right side or the left lower portion of the abdomen.  You pass bloody or black tarry stools. MAKE SURE YOU:  Understand these instructions.   Will watch your condition.   Will get help right away if you are not doing well or get worse.   Document Released: 02/20/2005 Document Revised: 05/18/2013 Document Reviewed: 01/20/2013 [facility] Patient Information 2015 [place], [place]. This information is not intended to replace advice given to you by your health care provider. Make sure you discuss any questions you have with your health care provider.  Constipation Constipation is when a person has fewer than three bowel movements a week, has difficulty having a bowel movement, or has stools that are dry, hard, or larger than normal. As people grow older, constipation is more common. If you try to fix constipation with medicines that make you have a bowel movement (laxatives), the problem may get worse. Long-term laxative use may cause the muscles of the colon to become weak. A low-fiber diet, not taking in enough fluids, and taking certain medicines may make constipation worse.  CAUSES   Certain medicines, such as antidepressants, pain medicine, iron supplements, antacids, and water pills.   Certain diseases, such as diabetes, irritable bowel syndrome (IBS), thyroid disease, or depression.   Not drinking enough water.   Not eating enough fiber-rich foods.   Stress or travel.   Lack of physical activity or exercise.   Ignoring the urge to have a bowel movement.   Using laxatives too much.  SIGNS AND SYMPTOMS   Having fewer than three bowel movements a week.   Straining to have a bowel movement.   Having stools that are hard, dry, or larger than normal.   Feeling full or bloated.   Pain in  the lower abdomen.   Not feeling relief after having a bowel movement.  DIAGNOSIS  Your health care provider will take a medical history and perform a physical exam. Further testing may be done for severe constipation. Some tests may include:  A barium enema X-ray to examine your rectum, colon, and, sometimes, your small intestine.   A sigmoidoscopy to examine your lower colon.   A colonoscopy to examine your  entire colon. TREATMENT  Treatment will depend on the severity of your constipation and what is causing it. Some dietary treatments include drinking more fluids and eating more fiber-rich foods. Lifestyle treatments may include regular exercise. If these diet and lifestyle recommendations do not help, your health care provider may recommend taking over-the-counter laxative medicines to help you have bowel movements. Prescription medicines may be prescribed if over-the-counter medicines do not work.  HOME CARE INSTRUCTIONS   Eat foods that have a lot of fiber, such as fruits, vegetables, whole grains, and beans.  Limit foods high in fat and processed sugars, such as french fries, hamburgers, cookies, candies, and soda.   A fiber supplement may be added to your diet if you cannot get enough fiber from foods.   Drink enough fluids to keep your urine clear or pale yellow.   Exercise regularly or as directed by your health care provider.   Go to the restroom when you have the urge to go. Do not hold it.   Only take over-the-counter or prescription medicines as directed by your health care provider. Do not take other medicines for constipation without talking to your health care provider first.  [place] IF:   You have bright red blood in your stool.   Your constipation lasts for more than 4 days or gets worse.   You have abdominal or rectal pain.   You have thin, pencil-like stools.   You have unexplained weight loss. MAKE SURE YOU:   Understand these instructions.  Will watch your condition.  Will get help right away if you are not doing well or get worse. Document Released: 02/09/2004 Document Revised: 05/18/2013 Document Reviewed: 02/22/2013 [facility] Patient Information 2015 [place], [place]. This information is not intended to replace advice given to you by your health care provider. Make sure you discuss any questions you have with your health care  provider.

## 2014-03-05 NOTE — Progress Notes (Signed)
UR completed 

## 2014-03-07 ENCOUNTER — Encounter (HOSPITAL_COMMUNITY): Payer: Self-pay | Admitting: Gastroenterology

## 2014-03-07 LAB — HELICOBACTER PYLORI ABS-IGG+IGA, BLD
H Pylori IgA: 1.5 U/mL (ref <9.0)
H Pylori IgG: <0.4 {ISR}

## 2014-03-09 ENCOUNTER — Other Ambulatory Visit (INDEPENDENT_AMBULATORY_CARE_PROVIDER_SITE_OTHER): Payer: BC Managed Care – PPO

## 2014-03-09 DIAGNOSIS — R1013 Epigastric pain: Secondary | ICD-10-CM

## 2014-03-09 LAB — COMPREHENSIVE METABOLIC PANEL
ALT: 14 U/L (ref 0–35)
AST: 17 U/L (ref 0–37)
Albumin: 3.7 g/dL (ref 3.5–5.2)
Alkaline Phosphatase: 50 U/L (ref 39–117)
BILIRUBIN TOTAL: 0.7 mg/dL (ref 0.2–1.2)
BUN: 16 mg/dL (ref 6–23)
CO2: 26 meq/L (ref 19–32)
CREATININE: 1.3 mg/dL — AB (ref 0.4–1.2)
Calcium: 9.4 mg/dL (ref 8.4–10.5)
Chloride: 104 mEq/L (ref 96–112)
GFR: 47.46 mL/min — AB (ref >60.00)
GLUCOSE: 67 mg/dL — AB (ref 70–99)
Potassium: 4.4 mEq/L (ref 3.5–5.1)
Sodium: 137 mEq/L (ref 135–145)
TOTAL PROTEIN: 7.8 g/dL (ref 6.0–8.3)

## 2014-03-09 LAB — CBC
HCT: 41.6 % (ref 36.0–46.0)
Hemoglobin: 13.8 g/dL (ref 12.0–15.0)
MCHC: 33.2 g/dL (ref 30.0–36.0)
MCV: 90.6 fl (ref 78.0–100.0)
PLATELETS: 238 10*3/uL (ref 150.0–400.0)
RBC: 4.59 Mil/uL (ref 3.87–5.11)
RDW: 12.2 % (ref 11.5–15.5)
WBC: 7.3 10*3/uL (ref 4.0–10.5)

## 2014-03-11 ENCOUNTER — Other Ambulatory Visit: Payer: Self-pay

## 2014-03-14 ENCOUNTER — Encounter: Payer: Self-pay | Admitting: *Deleted

## 2014-03-16 ENCOUNTER — Encounter: Payer: Self-pay | Admitting: Gastroenterology

## 2014-03-16 ENCOUNTER — Ambulatory Visit (INDEPENDENT_AMBULATORY_CARE_PROVIDER_SITE_OTHER): Payer: BC Managed Care – PPO | Admitting: Gastroenterology

## 2014-03-16 VITALS — BP 114/70 | HR 72 | Ht 69.0 in | Wt 150.5 lb

## 2014-03-16 DIAGNOSIS — K2270 Barrett's esophagus without dysplasia: Secondary | ICD-10-CM

## 2014-03-16 DIAGNOSIS — R195 Other fecal abnormalities: Secondary | ICD-10-CM

## 2014-03-16 DIAGNOSIS — K589 Irritable bowel syndrome without diarrhea: Secondary | ICD-10-CM

## 2014-03-16 MED ORDER — MOVIPREP 100 G PO SOLR: 1.0000 | Freq: Once | ORAL | Status: DC

## 2014-03-16 NOTE — Patient Instructions (Signed)
You have been scheduled for a colonoscopy. Please follow written instructions given to you at your visit today.  Please pick up your prep kit at the pharmacy within the next 1-3 days. If you use inhalers (even only as needed), please bring them with you on the day of your procedure. Your physician has requested that you go to www.startemmi.com and enter the access code given to you at your visit today. This web site gives a general overview about your procedure. However, you should still follow specific instructions given to you by our office regarding your preparation for the procedure.  QJ:JHERDEY Pamela Aase, MD

## 2014-03-16 NOTE — Progress Notes (Signed)
    History of Present Illness: This is a 48 year old female who I evaluated earlier this month during hospitalization. I reviewed records from that hospitalization. No source of her dark, heme positive stool was uncovered on EGD. Her reflux symptoms are under excellent control on her current regimen. Her IBS-C is under excellent control on her current regimen. Post discharge hemoglobin was normal. Stools are normal brown color and she has had no GI complaints since discharge.  Current Medications, Allergies, Past Medical History, Past Surgical History, Family History and Social History were reviewed in [employer] record.  Physical Exam: General: Well developed , well nourished, no acute distress Head: Normocephalic and atraumatic Eyes:  sclerae anicteric, EOMI Ears: Normal auditory acuity Mouth: No deformity or lesions Lungs: Clear throughout to auscultation Heart: Regular rate and rhythm; no murmurs, rubs or bruits Abdomen: Soft, non tender and non distended. No masses, hepatosplenomegaly or hernias noted. Normal Bowel sounds Rectal: Deferred to colonoscopy Musculoskeletal: Symmetrical with no gross deformities  Pulses:  Normal pulses noted Extremities: No clubbing, cyanosis, edema or deformities noted Neurological: Alert oriented x 4, grossly nonfocal Psychological:  Alert and cooperative. Normal mood and affect  Assessment and Recommendations:  1. Heme positive, dark stool with a mild anemia in early October. Now stools are normal color and her Hb is normal. Schedule colonoscopy. The risks, benefits, and alternatives to colonoscopy with possible biopsy and possible polypectomy were discussed with the patient and they consent to proceed. Consider CE if colonoscopy is negative she has recurrent overt or occult bleeding.  2. IBS-C, well controlled. Continue Linzess 145 mcg daily and Miralax bid prn.  3. Barrett's and GERD, well controlled. Continue antireflux  measure, Protonix 40 mg po bid and famotidine 40 mg hs. EGD in 04/2016 for Barrett's surveillance.

## 2014-03-22 ENCOUNTER — Encounter: Payer: Self-pay | Admitting: Gastroenterology

## 2014-04-01 ENCOUNTER — Other Ambulatory Visit: Payer: Self-pay | Admitting: Emergency Medicine

## 2014-04-15 ENCOUNTER — Encounter: Payer: Self-pay | Admitting: Gastroenterology

## 2014-04-15 ENCOUNTER — Ambulatory Visit (AMBULATORY_SURGERY_CENTER): Payer: BC Managed Care – PPO | Admitting: Gastroenterology

## 2014-04-15 VITALS — BP 121/78 | HR 58 | Temp 98.1°F | Resp 16 | Ht 69.0 in | Wt 150.0 lb

## 2014-04-15 DIAGNOSIS — R195 Other fecal abnormalities: Secondary | ICD-10-CM

## 2014-04-15 MED ORDER — SODIUM CHLORIDE 0.9 % IV SOLN: 500.0000 mL | INTRAVENOUS | Status: DC

## 2014-04-15 NOTE — Patient Instructions (Addendum)
YOU HAD AN ENDOSCOPIC PROCEDURE TODAY AT THE [facility]: Refer to the procedure report that was given to you for any specific questions about what was found during the examination.  If the procedure report does not answer your questions, please call your gastroenterologist to clarify.  If you requested that your care partner not be given the details of your procedure findings, then the procedure report has been included in a sealed envelope for you to review at your convenience later.  YOU SHOULD EXPECT: Some feelings of bloating in the abdomen. Passage of more gas than usual.  Walking can help get rid of the air that was put into your GI tract during the procedure and reduce the bloating. If you had a lower endoscopy (such as a colonoscopy or flexible sigmoidoscopy) you may notice spotting of blood in your stool or on the toilet paper. If you underwent a bowel prep for your procedure, then you may not have a normal bowel movement for a few days.  DIET: Your first meal following the procedure should be a light meal and then it is ok to progress to your normal diet.  A half-sandwich or bowl of soup is an example of a good first meal.  Heavy or fried foods are harder to digest and may make you feel nauseous or bloated.  Likewise meals heavy in dairy and vegetables can cause extra gas to form and this can also increase the bloating.  Drink plenty of fluids but you should avoid alcoholic beverages for 24 hours.  ACTIVITY: Your care partner should take you home directly after the procedure.  You should plan to take it easy, moving slowly for the rest of the day.  You can resume normal activity the day after the procedure however you should NOT DRIVE or use heavy machinery for 24 hours (because of the sedation medicines used during the test).    SYMPTOMS TO REPORT IMMEDIATELY: A gastroenterologist can be reached at any hour.  During normal business hours, 8:30 AM to 5:00 PM Monday through Friday,  call [phone].  After hours and on weekends, please call the GI answering service at [phone] who will take a message and have the physician on call contact you.   Following lower endoscopy (colonoscopy or flexible sigmoidoscopy):  Excessive amounts of blood in the stool  Significant tenderness or worsening of abdominal pains  Swelling of the abdomen that is new, acute  Fever of 100F or higher  Following upper endoscopy (EGD)  Vomiting of blood or coffee ground material  New chest pain or pain under the shoulder blades  Painful or persistently difficult swallowing  New shortness of breath  Fever of 100F or higher  Black, tarry-looking stools  FOLLOW UP: If any biopsies were taken you will be contacted by phone or by letter within the next 1-3 weeks.  Call your gastroenterologist if you have not heard about the biopsies in 3 weeks.  Our staff will call the home number listed on your records the next business day following your procedure to check on you and address any questions or concerns that you may have at that time regarding the information given to you following your procedure. This is a courtesy call and so if there is no answer at the home number and we have not heard from you through the emergency physician on call, we will assume that you have returned to your regular daily activities without incident.  SIGNATURES/CONFIDENTIALITY: You and/or your care   partner have signed paperwork which will be entered into your electronic medical record.  These signatures attest to the fact that that the information above on your After Visit Summary has been reviewed and is understood.  Full responsibility of the confidentiality of this discharge information lies with you and/or your care-partner.  Recommendations Next colonoscopy in 10 years. FOBT not needed for at least 5 years. Hemorrhoid handout provided to patient/care partner.

## 2014-04-15 NOTE — Op Note (Signed)
[facility]  [address] [place], [id number]   COLONOSCOPY PROCEDURE REPORT  PATIENT: Pamela, Welch  MR#: [id number] BIRTHDATE: [birth year] , 7  yrs. old GENDER: female ENDOSCOPIST: Ladene Artist, MD, [facility] REFERRED [id number] Melford Aase, M.D. PROCEDURE DATE:  04/15/2014 PROCEDURE:   Colonoscopy, diagnostic First Screening Colonoscopy - Avg.  risk and is 50 yrs.  old or older - No.  Prior Negative Screening - Now for repeat screening. N/A  History of Adenoma - Now for follow-up colonoscopy & has been > or = to 3 yrs.  N/A  Polyps Removed Today? No.  Polyps Removed Today? No.  Recommend repeat exam, <10 yrs? Polyps Removed Today? No.  Recommend repeat exam, <10 yrs? No. ASA CLASS:   Class II INDICATIONS:heme-positive stool. MEDICATIONS: Monitored anesthesia care and Propofol 300 mg IV DESCRIPTION OF PROCEDURE:   After the risks benefits and alternatives of the procedure were thoroughly explained, informed consent was obtained.  The digital rectal exam revealed no abnormalities of the rectum.   The LB [id number]  endoscope was introduced through the anus and advanced to the cecum, which was identified by both the appendix and ileocecal valve. No adverse events experienced.   The quality of the prep was good, using MoviPrep  The instrument was then slowly withdrawn as the colon was fully examined.  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 Otherwise a normal appearing cecum, ileocecal valve, and appendiceal orifice were identified.  The ascending, transverse, descending, sigmoid colon, and rectum appeared unremarkable. Retroflexed views revealed internal Grade I hemorrhoids. The time to cecum=3 minutes 15 seconds.  Withdrawal time=9 minutes 09 seconds.  The scope was withdrawn and the procedure completed. COMPLICATIONS: There were no immediate complications.  ENDOSCOPIC IMPRESSION: 1.   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   Normal  colonoscopy otherwise 3.   Grade I internal hemorrhoids  RECOMMENDATIONS: 1.  Continue to follow colorectal cancer screening guidelines for "routine risk" patients with a repeat colonoscopy in 10 years. There is no need for routine, screening FOBT (stool) testing for at least 5 years.  eSigned:  Ladene Artist, MD, [facility] 04/15/2014 3:00 PM

## 2014-04-15 NOTE — Progress Notes (Signed)
Patient awakening,vss,report to rn 

## 2014-04-18 ENCOUNTER — Telehealth: Payer: Self-pay | Admitting: *Deleted

## 2014-04-18 NOTE — Telephone Encounter (Signed)
  Follow up Call-  Call back number 04/15/2014 05/17/2013 11/25/2011  Post procedure Call Back phone  # [phone]  Permission to leave phone message Yes Yes Yes   LMOM

## 2014-04-27 ENCOUNTER — Ambulatory Visit (INDEPENDENT_AMBULATORY_CARE_PROVIDER_SITE_OTHER): Payer: BC Managed Care – PPO | Admitting: Internal Medicine

## 2014-04-27 ENCOUNTER — Other Ambulatory Visit: Payer: Self-pay

## 2014-04-27 ENCOUNTER — Encounter: Payer: Self-pay | Admitting: Internal Medicine

## 2014-04-27 VITALS — BP 114/70 | HR 80 | Temp 98.1°F | Resp 16 | Ht 69.0 in | Wt 149.2 lb

## 2014-04-27 DIAGNOSIS — R945 Abnormal results of liver function studies: Secondary | ICD-10-CM

## 2014-04-27 DIAGNOSIS — K589 Irritable bowel syndrome without diarrhea: Secondary | ICD-10-CM

## 2014-04-27 DIAGNOSIS — R7989 Other specified abnormal findings of blood chemistry: Secondary | ICD-10-CM

## 2014-04-27 DIAGNOSIS — Z79899 Other long term (current) drug therapy: Secondary | ICD-10-CM | POA: Insufficient documentation

## 2014-04-27 DIAGNOSIS — G4733 Obstructive sleep apnea (adult) (pediatric): Secondary | ICD-10-CM

## 2014-04-27 DIAGNOSIS — K219 Gastro-esophageal reflux disease without esophagitis: Secondary | ICD-10-CM

## 2014-04-27 DIAGNOSIS — E559 Vitamin D deficiency, unspecified: Secondary | ICD-10-CM

## 2014-04-27 DIAGNOSIS — E782 Mixed hyperlipidemia: Secondary | ICD-10-CM

## 2014-04-27 DIAGNOSIS — Z0001 Encounter for general adult medical examination with abnormal findings: Secondary | ICD-10-CM

## 2014-04-27 DIAGNOSIS — Z23 Encounter for immunization: Secondary | ICD-10-CM

## 2014-04-27 DIAGNOSIS — Z111 Encounter for screening for respiratory tuberculosis: Secondary | ICD-10-CM

## 2014-04-27 DIAGNOSIS — Z1212 Encounter for screening for malignant neoplasm of rectum: Secondary | ICD-10-CM

## 2014-04-27 DIAGNOSIS — Z113 Encounter for screening for infections with a predominantly sexual mode of transmission: Secondary | ICD-10-CM

## 2014-04-27 DIAGNOSIS — R7309 Other abnormal glucose: Secondary | ICD-10-CM

## 2014-04-27 DIAGNOSIS — I10 Essential (primary) hypertension: Secondary | ICD-10-CM

## 2014-04-27 DIAGNOSIS — R6889 Other general symptoms and signs: Secondary | ICD-10-CM

## 2014-04-27 LAB — CBC WITH DIFFERENTIAL/PLATELET
BASOS PCT: 1 % (ref 0–1)
Basophils Absolute: 0.1 10*3/uL (ref 0.0–0.1)
EOS ABS: 0.2 10*3/uL (ref 0.0–0.7)
Eosinophils Relative: 3 % (ref 0–5)
HCT: 38.7 % (ref 36.0–46.0)
Hemoglobin: 13 g/dL (ref 12.0–15.0)
Lymphocytes Relative: 36 % (ref 12–46)
Lymphs Abs: 2.1 10*3/uL (ref 0.7–4.0)
MCH: 29.7 pg (ref 26.0–34.0)
MCHC: 33.6 g/dL (ref 30.0–36.0)
MCV: 88.6 fL (ref 78.0–100.0)
MPV: 9.9 fL (ref 9.4–12.4)
Monocytes Absolute: 0.3 10*3/uL (ref 0.1–1.0)
Monocytes Relative: 6 % (ref 3–12)
NEUTROS PCT: 54 % (ref 43–77)
Neutro Abs: 3.1 10*3/uL (ref 1.7–7.7)
PLATELETS: 259 10*3/uL (ref 150–400)
RBC: 4.37 MIL/uL (ref 3.87–5.11)
RDW: 13.1 % (ref 11.5–15.5)
WBC: 5.8 10*3/uL (ref 4.0–10.5)

## 2014-04-27 MED ORDER — BUPROPION HCL ER (XL) 300 MG PO TB24: 300.0000 mg | ORAL_TABLET | ORAL | Status: DC

## 2014-04-27 NOTE — Patient Instructions (Signed)
Recommend the book "The END of DIETING" by Dr Baker Janus   and the book "The END of DIABETES " by Dr Excell Seltzer  At [email or web address] - get book & Audio CD's      Being diabetic has a  300% increased risk for heart attack, stroke, cancer, and alzheimer- type vascular dementia. It is very important that you work harder with diet by avoiding all foods that are white except chicken & fish. Avoid white rice (brown & wild rice is OK), white potatoes (sweetpotatoes in moderation is OK), White bread or wheat bread or anything made out of white flour like bagels, donuts, rolls, buns, biscuits, cakes, pastries, cookies, pizza crust, and pasta (made from white flour & egg whites) - vegetarian pasta or spinach or wheat pasta is OK. Multigrain breads like Arnold's or [employer], or multigrain sandwich thins or flatbreads.  Diet, exercise and weight loss can reverse and cure diabetes in the early stages.  Diet, exercise and weight loss is very important in the control and prevention of complications of diabetes which affects every system in your body, ie. Brain - dementia/stroke, eyes - glaucoma/blindness, heart - heart attack/heart failure, kidneys - dialysis, stomach - gastric paralysis, intestines - malabsorption, nerves - severe painful neuritis, circulation - gangrene & loss of a leg(s), and finally cancer and Alzheimers.    I recommend avoid fried & greasy foods,  sweets/candy, white rice (brown or wild rice or Quinoa is OK), white potatoes (sweet potatoes are OK) - anything made from white flour - bagels, doughnuts, rolls, buns, biscuits,white and wheat breads, pizza crust and traditional pasta made of white flour & egg white(vegetarian pasta or spinach or wheat pasta is OK).  Multi-grain bread is OK - like multi-grain flat bread or sandwich thins. Avoid alcohol in excess. Exercise is also important.    Eat all the vegetables you want - avoid meat, especially red meat and dairy - especially cheese.  Cheese  is the most concentrated form of trans-fats which is the worst thing to clog up our arteries. Veggie cheese is OK which can be found in the fresh produce section at [employer] or [employer] or [employer]  Preventive Care for Adults A healthy lifestyle and preventive care can promote health and wellness. Preventive health guidelines for women include the following key practices.  A routine yearly physical is a good way to check with your health care provider about your health and preventive screening. It is a chance to share any concerns and updates on your health and to receive a thorough exam.  Visit your dentist for a routine exam and preventive care every 6 months. Brush your teeth twice a day and floss once a day. Good oral hygiene prevents tooth decay and gum disease.  The frequency of eye exams is based on your age, health, family medical history, use of contact lenses, and other factors. Follow your health care provider's recommendations for frequency of eye exams.  Eat a healthy diet. Foods like vegetables, fruits, whole grains, low-fat dairy products, and lean protein foods contain the nutrients you need without too many calories. Decrease your intake of foods high in solid fats, added sugars, and salt. Eat the right amount of calories for you.Get information about a proper diet from your health care provider, if necessary.  Regular physical exercise is one of the most important things you can do for your health. Most adults should get at least 150 minutes of moderate-intensity exercise (any activity that increases  your heart rate and causes you to sweat) each week. In addition, most adults need muscle-strengthening exercises on 2 or more days a week.  Maintain a healthy weight. The body mass index (BMI) is a screening tool to identify possible weight problems. It provides an estimate of body fat based on height and weight. Your health care provider can find your BMI and can help you  achieve or maintain a healthy weight.For adults 20 years and older:  A BMI below 18.5 is considered underweight.  A BMI of 18.5 to 24.9 is normal.  A BMI of 25 to 29.9 is considered overweight.  A BMI of 30 and above is considered obese.  Maintain normal blood lipids and cholesterol levels by exercising and minimizing your intake of saturated fat. Eat a balanced diet with plenty of fruit and vegetables. Blood tests for lipids and cholesterol should begin at age 38 and be repeated every 5 years. If your lipid or cholesterol levels are high, you are over 50, or you are at high risk for heart disease, you may need your cholesterol levels checked more frequently.Ongoing high lipid and cholesterol levels should be treated with medicines if diet and exercise are not working.  If you smoke, find out from your health care provider how to quit. If you do not use tobacco, do not start.  Lung cancer screening is recommended for adults aged 64-80 years who are at high risk for developing lung cancer because of a history of smoking. A yearly low-dose CT scan of the lungs is recommended for people who have at least a 30-pack-year history of smoking and are a current smoker or have quit within the past 15 years. A pack year of smoking is smoking an average of 1 pack of cigarettes a day for 1 year (for example: 1 pack a day for 30 years or 2 packs a day for 15 years). Yearly screening should continue until the smoker has stopped smoking for at least 15 years. Yearly screening should be stopped for people who develop a health problem that would prevent them from having lung cancer treatment.  High blood pressure causes heart disease and increases the risk of stroke. Your blood pressure should be checked at least every 1 to 2 years. Ongoing high blood pressure should be treated with medicines if weight loss and exercise do not work.  If you are 79-57 years old, ask your health care provider if you should take  aspirin to prevent strokes.  Diabetes screening involves taking a blood sample to check your fasting blood sugar level. This should be done once every 3 years, after age 37, if you are within normal weight and without risk factors for diabetes. Testing should be considered at a younger age or be carried out more frequently if you are overweight and have at least 1 risk factor for diabetes.  Breast cancer screening is essential preventive care for women. You should practice "breast self-awareness." This means understanding the normal appearance and feel of your breasts and may include breast self-examination. Any changes detected, no matter how small, should be reported to a health care provider. Women in their 76s and 30s should have a clinical breast exam (CBE) by a health care provider as part of a regular health exam every 1 to 3 years. After age 55, women should have a CBE every year. Starting at age 79, women should consider having a mammogram (breast X-ray test) every year. Women who have a family history of breast  cancer should talk to their health care provider about genetic screening. Women at a high risk of breast cancer should talk to their health care providers about having an MRI and a mammogram every year.  Breast cancer gene (BRCA)-related cancer risk assessment is recommended for women who have family members with BRCA-related cancers. BRCA-related cancers include breast, ovarian, tubal, and peritoneal cancers. Having family members with these cancers may be associated with an increased risk for harmful changes (mutations) in the breast cancer genes BRCA1 and BRCA2. Results of the assessment will determine the need for genetic counseling and BRCA1 and BRCA2 testing.  Routine pelvic exams to screen for cancer are no longer recommended for nonpregnant women who are considered low risk for cancer of the pelvic organs (ovaries, uterus, and vagina) and who do not have symptoms. Ask your health  care provider if a screening pelvic exam is right for you.  If you have had past treatment for cervical cancer or a condition that could lead to cancer, you need Pap tests and screening for cancer for at least 20 years after your treatment. If Pap tests have been discontinued, your risk factors (such as having a new sexual partner) need to be reassessed to determine if screening should be resumed. Some women have medical problems that increase the chance of getting cervical cancer. In these cases, your health care provider may recommend more frequent screening and Pap tests.  Colorectal cancer can be detected and often prevented. Most routine colorectal cancer screening begins at the age of 58 years and continues through age 44 years. However, your health care provider may recommend screening at an earlier age if you have risk factors for colon cancer. On a yearly basis, your health care provider may provide home test kits to check for hidden blood in the stool. Use of a small camera at the end of a tube, to directly examine the colon (sigmoidoscopy or colonoscopy), can detect the earliest forms of colorectal cancer. Talk to your health care provider about this at age 84, when routine screening begins. Direct exam of the colon should be repeated every 5-10 years through age 33 years, unless early forms of pre-cancerous polyps or small growths are found.  Hepatitis C blood testing is recommended for all people born from [birth year] through 1965 and any individual with known risks for hepatitis C.  Pra  Osteoporosis is a disease in which the bones lose minerals and strength with aging. This can result in serious bone fractures or breaks. The risk of osteoporosis can be identified using a bone density scan. Women ages 36 years and over and women at risk for fractures or osteoporosis should discuss screening with their health care providers. Ask your health care provider whether you should take a calcium supplement  or vitamin D to reduce the rate of osteoporosis.  Menopause can be associated with physical symptoms and risks. Hormone replacement therapy is available to decrease symptoms and risks. You should talk to your health care provider about whether hormone replacement therapy is right for you.  Use sunscreen. Apply sunscreen liberally and repeatedly throughout the day. You should seek shade when your shadow is shorter than you. Protect yourself by wearing long sleeves, pants, a wide-brimmed hat, and sunglasses year round, whenever you are outdoors.  Once a month, do a whole body skin exam, using a mirror to look at the skin on your back. Tell your health care provider of new moles, moles that have irregular borders, moles that are  larger than a pencil eraser, or moles that have changed in shape or color.  Stay current with required vaccines (immunizations).  Influenza vaccine. All adults should be immunized every year.  Tetanus, diphtheria, and acellular pertussis (Td, Tdap) vaccine. Pregnant women should receive 1 dose of Tdap vaccine during each pregnancy. The dose should be obtained regardless of the length of time since the last dose. Immunization is preferred during the 27th-36th week of gestation. An adult who has not previously received Tdap or who does not know her vaccine status should receive 1 dose of Tdap. This initial dose should be followed by tetanus and diphtheria toxoids (Td) booster doses every 10 years. Adults with an unknown or incomplete history of completing a 3-dose immunization series with Td-containing vaccines should begin or complete a primary immunization series including a Tdap dose. Adults should receive a Td booster every 10 years.  Varicella vaccine. An adult without evidence of immunity to varicella should receive 2 doses or a second dose if she has previously received 1 dose. Pregnant females who do not have evidence of immunity should receive the first dose after  pregnancy. This first dose should be obtained before leaving the health care facility. The second dose should be obtained 4-8 weeks after the first dose.  Human papillomavirus (HPV) vaccine. Females aged 13-26 years who have not received the vaccine previously should obtain the 3-dose series. The vaccine is not recommended for use in pregnant females. However, pregnancy testing is not needed before receiving a dose. If a female is found to be pregnant after receiving a dose, no treatment is needed. In that case, the remaining doses should be delayed until after the pregnancy. Immunization is recommended for any person with an immunocompromised condition through the age of 26 years if she did not get any or all doses earlier. During the 3-dose series, the second dose should be obtained 4-8 weeks after the first dose. The third dose should be obtained 24 weeks after the first dose and 16 weeks after the second dose.  Zoster vaccine. One dose is recommended for adults aged 60 years or older unless certain conditions are present.  Measles, mumps, and rubella (MMR) vaccine. Adults born before 1957 generally are considered immune to measles and mumps. Adults born in 1957 or later should have 1 or more doses of MMR vaccine unless there is a contraindication to the vaccine or there is laboratory evidence of immunity to each of the three diseases. A routine second dose of MMR vaccine should be obtained at least 28 days after the first dose for students attending postsecondary schools, health care workers, or international travelers. People who received inactivated measles vaccine or an unknown type of measles vaccine during 1963-1967 should receive 2 doses of MMR vaccine. People who received inactivated mumps vaccine or an unknown type of mumps vaccine before 1979 and are at high risk for mumps infection should consider immunization with 2 doses of MMR vaccine. For females of childbearing age, rubella immunity should  be determined. If there is no evidence of immunity, females who are not pregnant should be vaccinated. If there is no evidence of immunity, females who are pregnant should delay immunization until after pregnancy. Unvaccinated health care workers born before 1957 who lack laboratory evidence of measles, mumps, or rubella immunity or laboratory confirmation of disease should consider measles and mumps immunization with 2 doses of MMR vaccine or rubella immunization with 1 dose of MMR vaccine.  Pneumococcal 13-valent conjugate (PCV13) vaccine. When   indicated, a person who is uncertain of her immunization history and has no record of immunization should receive the PCV13 vaccine. An adult aged 73 years or older who has certain medical conditions and has not been previously immunized should receive 1 dose of PCV13 vaccine. This PCV13 should be followed with a dose of pneumococcal polysaccharide (PPSV23) vaccine. The PPSV23 vaccine dose should be obtained at least 8 weeks after the dose of PCV13 vaccine. An adult aged 81 years or older who has certain medical conditions and previously received 1 or more doses of PPSV23 vaccine should receive 1 dose of PCV13. The PCV13 vaccine dose should be obtained 1 or more years after the last PPSV23 vaccine dose.    Pneumococcal polysaccharide (PPSV23) vaccine. When PCV13 is also indicated, PCV13 should be obtained first. All adults aged 69 years and older should be immunized. An adult younger than age 35 years who has certain medical conditions should be immunized. Any person who resides in a nursing home or long-term care facility should be immunized. An adult smoker should be immunized. People with an immunocompromised condition and certain other conditions should receive both PCV13 and PPSV23 vaccines. People with human immunodeficiency virus (HIV) infection should be immunized as soon as possible after diagnosis. Immunization during chemotherapy or radiation therapy should  be avoided. Routine use of PPSV23 vaccine is not recommended for American Indians, [place]atives, or people younger than 65 years unless there are medical conditions that require PPSV23 vaccine. When indicated, people who have unknown immunization and have no record of immunization should receive PPSV23 vaccine. One-time revaccination 5 years after the first dose of PPSV23 is recommended for people aged 19-64 years who have chronic kidney failure, nephrotic syndrome, asplenia, or immunocompromised conditions. People who received 1-2 doses of PPSV23 before age 79 years should receive another dose of PPSV23 vaccine at age 73 years or later if at least 5 years have passed since the previous dose. Doses of PPSV23 are not needed for people immunized with PPSV23 at or after age 19 years.  Preventive Services / Frequency   Ages 25 to 65 years  Blood pressure check.  Lipid and cholesterol check.  Lung cancer screening. / Every year if you are aged 8-80 years and have a 30-pack-year history of smoking and currently smoke or have quit within the past 15 years. Yearly screening is stopped once you have quit smoking for at least 15 years or develop a health problem that would prevent you from having lung cancer treatment.  Clinical breast exam.** / Every year after age 28 years.  BRCA-related cancer risk assessment.** / For women who have family members with a BRCA-related cancer (breast, ovarian, tubal, or peritoneal cancers).  Mammogram.** / Every year beginning at age 57 years and continuing for as long as you are in good health. Consult with your health care provider.  Pap test.** / Every 3 years starting at age 57 years through age 25 or 13 years with a history of 3 consecutive normal Pap tests.  HPV screening.** / Every 3 years from ages 21 years through ages 60 to 48 years with a history of 3 consecutive normal Pap tests.  Fecal occult blood test (FOBT) of stool. / Every year beginning at age 77  years and continuing until age 57 years. You may not need to do this test if you get a colonoscopy every 10 years.  Flexible sigmoidoscopy or colonoscopy.** / Every 5 years for a flexible sigmoidoscopy or every 10 years  for a colonoscopy beginning at age 104 years and continuing until age 39 years.  Hepatitis C blood test.** / For all people born from [birth year] through 1966 and any individual with known risks for hepatitis C.  Skin self-exam. / Monthly.  Influenza vaccine. / Every year.  Tetanus, diphtheria, and acellular pertussis (Tdap/Td) vaccine.** / Consult your health care provider. Pregnant women should receive 1 dose of Tdap vaccine during each pregnancy. 1 dose of Td every 10 years.  Varicella vaccine.** / Consult your health care provider. Pregnant females who do not have evidence of immunity should receive the first dose after pregnancy.  Zoster vaccine.** / 1 dose for adults aged 47 years or older.  Pneumococcal 13-valent conjugate (PCV13) vaccine.** / Consult your health care provider.  Pneumococcal polysaccharide (PPSV23) vaccine.** / 1 to 2 doses if you smoke cigarettes or if you have certain conditions.  Meningococcal vaccine.** / Consult your health care provider.  Hepatitis A vaccine.** / Consult your health care provider.  Hepatitis B vaccine.** / Consult your health care provider. Screening for abdominal aortic aneurysm (AAA)  by ultrasound is recommended for people over 50 who have history of high blood pressure or who are current or former smokers.

## 2014-04-27 NOTE — Progress Notes (Signed)
Patient ID: Pamela Welch, female   DOB: [birth year], 48 y.o.   MRN: [id number]  Annual Screening Comprehensive Examination  This very nice 48 y.o.SWF presents for complete physical.  Patient has been followed for HTN, Prediabetes Screening, Hyperlipidemia, and Vitamin D Deficiency.   Patient was recently hospitalized 10/8-02/2014 for rectal bleeding and has a negative EGD by Dr Fuller Plan and post hospital had a negative colonoscopy.    Labile HTN predates since 2004 and is monitored expectantly. Patient's BP has been controlled at home and patient denies any cardiac symptoms as chest pain, palpitations, shortness of breath, dizziness or ankle swelling. Today's BP: 114/70 mmHg    Patient's hyperlipidemia is controlled with diet and medications. Patient denies myalgias or other medication SE's. Last lipids were at goal - Total Chol 169; HDL 72; LDL 70; Trig 134 on 09/13/2013.   Patient is screened for prediabetes and patient denies reactive hypoglycemic symptoms, visual blurring, diabetic polys, or paresthesias. Last A1c was 5.3% in Nov 2014.   Finally, patient has history of Vitamin D Deficiency of 30 in 2010 and last Vitamin D was 80 in Nov 2014.   Medication Sig  . famotidine (PEPCID) 40 MG tablet Take 40 mg by mouth at bedtime.  . fluticasone (FLONASE) 50 MCG/ACT nasal spray Place 2 sprays into both nostrils daily.  . hyoscyamine (LEVSIN SL) 0.125 MG SL tablet Place 1 tablet (0.125 mg total) under the tongue every 4 (four) hours as needed.  Marland Kitchen ipratropium (ATROVENT) 0.06 % nasal spray Place 2 sprays into both nostrils 2 (two) times daily.  . Linaclotide (LINZESS) 145 MCG CAPS capsule Take 1 capsule (145 mcg total) by mouth daily.  Marland Kitchen LORazepam (ATIVAN) 2 MG tablet TAKE 1/2 TO 1 TABLET BY MOUTH AT BEDTIME AS NEEDED FOR SLEEP  . montelukast (SINGULAIR) 10 MG tablet Take 10 mg by mouth at bedtime.  . ondansetron (ZOFRAN ODT) 4 MG disintegrating tablet Take 1 tablet (4 mg total) by mouth every 8 (eight)  hours as needed for nausea or vomiting.  . pantoprazole (PROTONIX) 40 MG tablet Take 1 tablet (40 mg total) by mouth 2 (two) times daily.  . polyethylene glycol (MIRALAX / GLYCOLAX) packet Take 17 g by mouth 2 (two) times daily as needed.  . rosuvastatin (CRESTOR) 20 MG tablet Take 20 mg by mouth daily. evening  . trazodone (DESYREL) 300 MG tablet Take 150-300 mg by mouth daily after supper.     Allergies  Allergen Reactions  . Ambien [Zolpidem Tartrate]     Memory issues  . Atenolol     unknown  . Cholestatin     unknown  . Mirapex [Pramipexole Dihydrochloride]     Hot flashes  . Requip [Ropinirole Hcl]     Hot flashes   Past Medical History  Diagnosis Date  . Chest pain, unspecified     normal myoview 2000;  echo 5/10:  mild LVH, EF 60-65%  . Palpitations     h/o symptomatic PVCs  . Restless legs syndrome (RLS)   . Obstructive sleep apnea (adult) (pediatric)   . Persistent disorder of initiating or maintaining sleep   . Anxiety   . Herniated disc   . Depression   . Hyperlipidemia   . Back injury   . Barrett's esophagus   . Lumbago   . Insomnia   . HSV-1 (herpes simplex virus 1) infection   . DDD (degenerative disc disease)   . Palpitations   . Melanosis coli   . Hiatal hernia   .  IBS (irritable bowel syndrome)   . GERD (gastroesophageal reflux disease)   . Allergy     SEASONAL  . Hypertension     PT. DENIES   Health Maintenance  Topic Date Due  . TETANUS/TDAP  05/28/2011  . INFLUENZA VACCINE  12/25/2013  . PAP SMEAR  05/18/2016   Immunization History  Administered Date(s) Administered  . DTaP 04/08/2012  . Influenza Split 04/15/2013, 04/27/2014  . Influenza-Unspecified 03/27/2012  . PPD Test 04/15/2013, 04/27/2014  . Pneumococcal-Unspecified 05/27/2009  . Td 05/27/2001   Past Surgical History  Procedure Laterality Date  . Tubes in ears      as an adult  . Refractive surgery      bilateral eyes   . Eye surgery  2001  . Colonoscopy  2013  .  Esophagogastroduodenoscopy N/A 03/04/2014    Procedure: ESOPHAGOGASTRODUODENOSCOPY (EGD);  Surgeon: Ladene Artist, MD;  Location: [facility];  Service: Endoscopy;  Laterality: N/A;   Family History  Problem Relation Age of Onset  . Hypertension Mother   . Osteoporosis Mother   . Breast cancer Sister 71    BRCA NEG/BILAT MASTECTOMY/CHEMO X 1 YR  . Hyperlipidemia Sister   . Ovarian cancer Sister   . Colon cancer Maternal Grandmother   . Stroke Maternal Grandmother   . Osteoporosis Maternal Grandmother   . Colon cancer Maternal Grandfather   . Multiple myeloma Paternal Grandmother   . Hypertension Father   . Hyperlipidemia Father   . Heart disease Cousin   . Lung cancer Cousin    History  Substance Use Topics  . Smoking status: Never Smoker   . Smokeless tobacco: Never Used  . Alcohol Use: 0.0 oz/week    0 Not specified per week     Comment: Social    ROS Constitutional: Denies fever, chills, weight loss/gain, headaches, insomnia, fatigue, night sweats, and change in appetite. Eyes: Denies redness, blurred vision, diplopia, discharge, itchy, watery eyes.  ENT: Denies discharge, congestion, post nasal drip, epistaxis, sore throat, earache, hearing loss, dental pain, Tinnitus, Vertigo, Sinus pain, snoring.  Cardio: Denies chest pain, palpitations, irregular heartbeat, syncope, dyspnea, diaphoresis, orthopnea, PND, claudication, edema Respiratory: denies cough, dyspnea, DOE, pleurisy, hoarseness, laryngitis, wheezing.  Gastrointestinal: Denies dysphagia, heartburn, reflux, water brash, pain, cramps, nausea, vomiting, bloating, diarrhea, constipation, hematemesis, melena, hematochezia, jaundice, hemorrhoids Genitourinary: Denies dysuria, frequency, urgency, nocturia, hesitancy, discharge, hematuria, flank pain Breast: Breast lumps, nipple discharge, bleeding.  Musculoskeletal: Denies arthralgia, myalgia, stiffness, Jt. Swelling, pain, limp, and strain/sprain. Denies falls. Skin:  Denies puritis, rash, hives, warts, acne, eczema, changing in skin lesion Neuro: No weakness, tremor, incoordination, spasms, paresthesia, pain Psychiatric: Denies confusion, memory loss, sensory loss. Denies Depression. Endocrine: Denies change in weight, skin, hair change, nocturia, and paresthesia, diabetic polys, visual blurring, hyper / hypo glycemic episodes.  Heme/Lymph: No excessive bleeding, bruising, enlarged lymph nodes.  Physical Exam  BP 114/70   Pulse 80  Temp 98.1 F   Resp 16  Ht $R'5\' 9"'NY$    Wt 149 lb  BMI 22.02   LMP 03/15/2014  General Appearance: Well nourished and in no apparent distress. Eyes: PERRLA, EOMs, conjunctiva no swelling or erythema, normal fundi and vessels. Sinuses: No frontal/maxillary tenderness ENT/Mouth: EACs patent / TMs  nl. Nares clear without erythema, swelling, mucoid exudates. Oral hygiene is good. No erythema, swelling, or exudate. Tongue normal, non-obstructing. Tonsils not swollen or erythematous. Hearing normal.  Neck: Supple, thyroid normal. No bruits, nodes or JVD. Respiratory: Respiratory effort normal.  BS equal and clear  bilateral without rales, rhonci, wheezing or stridor. Cardio: Heart sounds are normal with regular rate and rhythm and no murmurs, rubs or gallops. Peripheral pulses are normal and equal bilaterally without edema. No aortic or femoral bruits. Chest: symmetric with normal excursions and percussion. Breasts: Symmetric, without lumps, nipple discharge, retractions, or fibrocystic changes.  Abdomen: Flat, soft, with bowl sounds. Nontender, no guarding, rebound, hernias, masses, or organomegaly.  Lymphatics: Non tender without lymphadenopathy.  Genitourinary: Deferred to GYN. Musculoskeletal: Full ROM all peripheral extremities, joint stability, 5/5 strength, and normal gait. Skin: Warm and dry without rashes, lesions, cyanosis, clubbing or  ecchymosis.  Neuro: Cranial nerves intact, reflexes equal bilaterally. Normal muscle  tone, no cerebellar symptoms. Sensation intact.  Pysch: Awake and oriented X 3, normal affect, Insight and Judgment appropriate.  Assessment and Plan  1. Annual Screening Examination 2. Hypertension, labile 3. Hyperlipidemia 4. Pre Diabetes, screening 5. GERD  6. IBS  7. Vitamin D Deficiency   Continue prudent diet as discussed, weight control, BP monitoring, regular exercise, and medications. Discussed med's effects and SE's. Screening labs and tests as requested with regular follow-up as recommended.

## 2014-04-28 LAB — BASIC METABOLIC PANEL WITH GFR
BUN: 9 mg/dL (ref 6–23)
CALCIUM: 9 mg/dL (ref 8.4–10.5)
CO2: 27 mEq/L (ref 19–32)
Chloride: 102 mEq/L (ref 96–112)
Creat: 1.05 mg/dL (ref 0.50–1.10)
GFR, EST AFRICAN AMERICAN: 72 mL/min
GFR, Est Non African American: 63 mL/min
GLUCOSE: 94 mg/dL (ref 70–99)
Potassium: 3.8 mEq/L (ref 3.5–5.3)
Sodium: 139 mEq/L (ref 135–145)

## 2014-04-28 LAB — MICROALBUMIN / CREATININE URINE RATIO
Creatinine, Urine: 358.3 mg/dL
Microalb Creat Ratio: 2.2 mg/g (ref 0.0–30.0)
Microalb, Ur: 0.8 mg/dL (ref <2.0)

## 2014-04-28 LAB — HEPATIC FUNCTION PANEL
ALT: 12 U/L (ref 0–35)
AST: 16 U/L (ref 0–37)
Albumin: 3.9 g/dL (ref 3.5–5.2)
Alkaline Phosphatase: 43 U/L (ref 39–117)
BILIRUBIN DIRECT: 0.1 mg/dL (ref 0.0–0.3)
BILIRUBIN TOTAL: 0.5 mg/dL (ref 0.2–1.2)
Indirect Bilirubin: 0.4 mg/dL (ref 0.2–1.2)
Total Protein: 6.5 g/dL (ref 6.0–8.3)

## 2014-04-28 LAB — HIV ANTIBODY (ROUTINE TESTING W REFLEX): HIV 1&2 Ab, 4th Generation: NONREACTIVE

## 2014-04-28 LAB — HEMOGLOBIN A1C
Hgb A1c MFr Bld: 5.6 % (ref <5.7)
Mean Plasma Glucose: 114 mg/dL (ref <117)

## 2014-04-28 LAB — LIPID PANEL
CHOL/HDL RATIO: 2.2 ratio
CHOLESTEROL: 150 mg/dL (ref 0–200)
HDL: 69 mg/dL (ref >39)
LDL Cholesterol: 57 mg/dL (ref 0–99)
Triglycerides: 119 mg/dL (ref <150)
VLDL: 24 mg/dL (ref 0–40)

## 2014-04-28 LAB — URINALYSIS, MICROSCOPIC ONLY
BACTERIA UA: NONE SEEN
CASTS: NONE SEEN
Crystals: NONE SEEN
Squamous Epithelial / LPF: NONE SEEN

## 2014-04-28 LAB — IRON AND TIBC
%SAT: 24 % (ref 20–55)
IRON: 80 ug/dL (ref 42–145)
TIBC: 336 ug/dL (ref 250–470)
UIBC: 256 ug/dL (ref 125–400)

## 2014-04-28 LAB — TSH: TSH: 2.646 u[IU]/mL (ref 0.350–4.500)

## 2014-04-28 LAB — INSULIN, FASTING: Insulin fasting, serum: 19.2 u[IU]/mL (ref 2.0–19.6)

## 2014-04-28 LAB — VITAMIN B12: Vitamin B-12: 508 pg/mL (ref 211–911)

## 2014-04-28 LAB — HEPATITIS B CORE ANTIBODY, TOTAL: Hep B Core Total Ab: NONREACTIVE

## 2014-04-28 LAB — HEPATITIS C ANTIBODY: HCV Ab: NEGATIVE

## 2014-04-28 LAB — VITAMIN D 25 HYDROXY (VIT D DEFICIENCY, FRACTURES): VIT D 25 HYDROXY: 36 ng/mL (ref 30–100)

## 2014-04-28 LAB — RPR

## 2014-04-28 LAB — HEPATITIS B SURFACE ANTIBODY,QUALITATIVE: Hep B S Ab: NEGATIVE

## 2014-04-28 LAB — MAGNESIUM: Magnesium: 1.7 mg/dL (ref 1.5–2.5)

## 2014-04-28 LAB — HEPATITIS A ANTIBODY, TOTAL: Hep A Total Ab: NONREACTIVE

## 2014-04-29 LAB — HEPATITIS B E ANTIBODY: HEPATITIS BE ANTIBODY: NONREACTIVE

## 2014-05-05 LAB — TB SKIN TEST
Induration: 0 mm
TB SKIN TEST: NEGATIVE

## 2014-05-24 ENCOUNTER — Other Ambulatory Visit: Payer: Self-pay | Admitting: Internal Medicine

## 2014-05-25 ENCOUNTER — Encounter: Payer: BC Managed Care – PPO | Admitting: Women's Health

## 2014-06-04 ENCOUNTER — Other Ambulatory Visit: Payer: Self-pay | Admitting: Internal Medicine

## 2014-06-04 ENCOUNTER — Other Ambulatory Visit: Payer: Self-pay | Admitting: Physician Assistant

## 2014-07-03 ENCOUNTER — Other Ambulatory Visit: Payer: Self-pay | Admitting: Internal Medicine

## 2014-07-06 ENCOUNTER — Other Ambulatory Visit: Payer: Self-pay | Admitting: Internal Medicine

## 2014-07-07 ENCOUNTER — Other Ambulatory Visit: Payer: Self-pay | Admitting: Internal Medicine

## 2014-07-07 MED ORDER — TRAZODONE HCL 150 MG PO TABS: ORAL_TABLET | ORAL | Status: DC

## 2014-07-11 ENCOUNTER — Other Ambulatory Visit: Payer: Self-pay | Admitting: Internal Medicine

## 2014-07-30 ENCOUNTER — Other Ambulatory Visit: Payer: Self-pay | Admitting: Emergency Medicine

## 2014-09-09 ENCOUNTER — Ambulatory Visit (INDEPENDENT_AMBULATORY_CARE_PROVIDER_SITE_OTHER): Payer: BLUE CROSS/BLUE SHIELD | Admitting: Internal Medicine

## 2014-09-09 DIAGNOSIS — T23102A Burn of first degree of left hand, unspecified site, initial encounter: Secondary | ICD-10-CM

## 2014-09-10 ENCOUNTER — Encounter: Payer: Self-pay | Admitting: Internal Medicine

## 2014-09-10 MED ORDER — HYDROCODONE-ACETAMINOPHEN 5-325 MG PO TABS: ORAL_TABLET | ORAL | Status: DC

## 2014-09-10 MED ORDER — PREDNISONE 20 MG PO TABS: ORAL_TABLET | ORAL | Status: DC

## 2014-09-10 NOTE — Progress Notes (Signed)
   Subjective:    Patient ID: Pamela Welch, female    DOB: 24-Jul-1965, 49 y.o.   MRN: [id number]  HPI Patient presents emergently for evaluation after accidentally splashing boiling water on her hand while preparing Bkft for her girls. She c/o moderate pain. She's tried icing with limited success   Meds, All, PMH/PSH reviewed.   Review of Systems neg & non-contributory   Objective:   Physical Exam  Examination of the dorsal right hand shows moderate erythema w/o any STS, bulla/blisters or denuded skin evident.  Sensory-motor functions are intact.    Assessment & Plan:   1. Burn, hand, first degree, left, initial encounter  - predniSONE (DELTASONE) 20 MG tablet; 1 tab 3 x day for 2 days, then 1 tab 2 x day for 2 days, then 1 tab 1 x day for 3 days  Dispense: 13 tablet; Refill: 0 - HYDROcodone-acetaminophen (NORCO) 5-325 MG per tablet; Take 1/2 to 1 tablet  Every 3-4 hours as need for pain  Dispense: 30 tablet; Refill: 0  - Pred to reduce swelling/pain and Norco as needed for  Pain - Discussed Sns/Sxs to monitor for infection

## 2014-09-15 ENCOUNTER — Encounter: Payer: Self-pay | Admitting: Women's Health

## 2014-09-15 ENCOUNTER — Ambulatory Visit (INDEPENDENT_AMBULATORY_CARE_PROVIDER_SITE_OTHER): Payer: BLUE CROSS/BLUE SHIELD | Admitting: Women's Health

## 2014-09-15 ENCOUNTER — Other Ambulatory Visit (HOSPITAL_COMMUNITY)
Admission: RE | Admit: 2014-09-15 | Discharge: 2014-09-15 | Disposition: A | Discharge status: Home / Self Care | Payer: BLUE CROSS/BLUE SHIELD | Source: Ambulatory Visit | Attending: Women's Health | Admitting: Women's Health

## 2014-09-15 VITALS — BP 126/80 | Ht 69.0 in | Wt 152.0 lb

## 2014-09-15 DIAGNOSIS — Z01419 Encounter for gynecological examination (general) (routine) without abnormal findings: Secondary | ICD-10-CM

## 2014-09-15 DIAGNOSIS — K640 First degree hemorrhoids: Secondary | ICD-10-CM | POA: Diagnosis not present

## 2014-09-15 DIAGNOSIS — Z1151 Encounter for screening for human papillomavirus (HPV): Secondary | ICD-10-CM | POA: Insufficient documentation

## 2014-09-15 MED ORDER — HYDROCORTISONE ACE-PRAMOXINE 2.5-1 % RE CREA: 1.0000 "application " | TOPICAL_CREAM | Freq: Three times a day (TID) | RECTAL | Status: DC

## 2014-09-15 NOTE — Progress Notes (Signed)
Pamela Welch 08/29/1965 [id number]    History:    Presents for annual exam.  Monthly cycle lesbian/same partner. Normal Pap history. Mammogram biopsy 05/2013 negative. Sister breast cancer bilateral mastectomy, negative BRCA. Has had numerous GI complaints this past year negative colonoscopy but informed intestinal lining color darker than normal. Primary care manages hypercholesterolemia.  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 Parents hypertension, father hypercholesterolemia, mother osteoporosis.  ROS:  A ROS was performed and pertinent positives and negatives are included.  Exam:  Filed Vitals:   09/15/14 1412  BP: 126/80    General appearance:  Normal Thyroid:  Symmetrical, normal in size, without palpable masses or nodularity. Respiratory  Auscultation:  Clear without wheezing or rhonchi Cardiovascular  Auscultation:  Regular rate, without rubs, murmurs or gallops  Edema/varicosities:  Not grossly evident Abdominal  Soft,nontender, without masses, guarding or rebound.  Liver/spleen:  No organomegaly noted  Hernia:  None appreciated  Skin  Inspection:  Grossly normal   Breasts: Examined lying and sitting.     Right: Without masses, retractions, discharge or axillary adenopathy.     Left: Without masses, retractions, discharge or axillary adenopathy. Gentitourinary   Inguinal/mons:  Normal without inguinal adenopathy  External genitalia:  Normal  BUS/Urethra/Skene's glands:  Normal  Vagina:  Normal  Cervix:  Normal  Uterus:   normal in size, shape and contour.  Midline and mobile  Adnexa/parametria:     Rt: Without masses or tenderness.   Lt: Without masses or tenderness.  Anus and perineum: Normal  Digital rectal exam: Normal sphincter tone without palpated masses or tenderness  Assessment/Plan:  49 y.o. S WF G0 for annual exam.     Monthly cycle/lesbian Negative breast biopsy 05/2013 IBS on Linzess per GI/Dr. Fuller Plan Hypercholesteremia/depression primary care manages labs and meds  Plan: Menopause reviewed, continue healthy lifestyle of regular exercise, calcium rich diet, vitamin D 1000 daily encouraged. UA, Pap with HR HPV typing, reviewed new screening guidelines. SBE's, continue annual screening mammogram 3-D tomography reviewed and encouraged. Analpram-HC prescription, proper use given for occasional hemorrhoidal flares.    Huel Cote [facility], 5:48 PM 09/15/2014

## 2014-09-15 NOTE — Patient Instructions (Signed)

## 2014-09-16 LAB — URINALYSIS W MICROSCOPIC + REFLEX CULTURE
BACTERIA UA: NONE SEEN
Bilirubin Urine: NEGATIVE
CASTS: NONE SEEN
Crystals: NONE SEEN
Glucose, UA: NEGATIVE mg/dL
Hgb urine dipstick: NEGATIVE
KETONES UR: NEGATIVE mg/dL
Leukocytes, UA: NEGATIVE
Nitrite: NEGATIVE
Protein, ur: NEGATIVE mg/dL
SPECIFIC GRAVITY, URINE: 1.028 (ref 1.005–1.030)
Urobilinogen, UA: 0.2 mg/dL (ref 0.0–1.0)
pH: 5 (ref 5.0–8.0)

## 2014-09-19 ENCOUNTER — Encounter: Payer: Self-pay | Admitting: Internal Medicine

## 2014-09-19 ENCOUNTER — Ambulatory Visit (INDEPENDENT_AMBULATORY_CARE_PROVIDER_SITE_OTHER): Payer: BLUE CROSS/BLUE SHIELD | Admitting: Internal Medicine

## 2014-09-19 VITALS — BP 104/68 | HR 82 | Temp 98.2°F | Resp 16 | Ht 69.0 in | Wt 153.0 lb

## 2014-09-19 DIAGNOSIS — E559 Vitamin D deficiency, unspecified: Secondary | ICD-10-CM

## 2014-09-19 DIAGNOSIS — R7309 Other abnormal glucose: Secondary | ICD-10-CM

## 2014-09-19 DIAGNOSIS — E782 Mixed hyperlipidemia: Secondary | ICD-10-CM

## 2014-09-19 DIAGNOSIS — Z79899 Other long term (current) drug therapy: Secondary | ICD-10-CM

## 2014-09-19 DIAGNOSIS — K219 Gastro-esophageal reflux disease without esophagitis: Secondary | ICD-10-CM

## 2014-09-19 LAB — CBC WITH DIFFERENTIAL/PLATELET
BASOS PCT: 1 % (ref 0–1)
Basophils Absolute: 0.1 10*3/uL (ref 0.0–0.1)
EOS ABS: 0.2 10*3/uL (ref 0.0–0.7)
EOS PCT: 3 % (ref 0–5)
HCT: 40 % (ref 36.0–46.0)
Hemoglobin: 13.3 g/dL (ref 12.0–15.0)
LYMPHS ABS: 1.7 10*3/uL (ref 0.7–4.0)
LYMPHS PCT: 27 % (ref 12–46)
MCH: 30 pg (ref 26.0–34.0)
MCHC: 33.3 g/dL (ref 30.0–36.0)
MCV: 90.1 fL (ref 78.0–100.0)
MONO ABS: 0.4 10*3/uL (ref 0.1–1.0)
MONOS PCT: 7 % (ref 3–12)
MPV: 9.9 fL (ref 8.6–12.4)
NEUTROS PCT: 62 % (ref 43–77)
Neutro Abs: 3.8 10*3/uL (ref 1.7–7.7)
Platelets: 246 10*3/uL (ref 150–400)
RBC: 4.44 MIL/uL (ref 3.87–5.11)
RDW: 12.9 % (ref 11.5–15.5)
WBC: 6.2 10*3/uL (ref 4.0–10.5)

## 2014-09-19 LAB — HEMOGLOBIN A1C
Hgb A1c MFr Bld: 5.5 % (ref <5.7)
Mean Plasma Glucose: 111 mg/dL (ref <117)

## 2014-09-19 LAB — CYTOLOGY - PAP

## 2014-09-19 NOTE — Patient Instructions (Signed)
Knee, Cartilage (Meniscus) Injury  It is suspected that you have a torn cartilage (meniscus) in your knee. The menisci are made of tough cartilage and fit between the surfaces of the thigh and leg bones. The menisci are C-shaped and have a wedged profile. The wedged profile helps the stability of the joint by keeping the rounded femur surface from sliding off the flat tibial surface. The menisci are fed (nourished) by small blood vessels, but there is also a large area at the inner edge of the meniscus that does not have a good blood supply (avascular). This presents a problem when there is an injury to the meniscus because areas without good blood supply heal poorly. As a result when there is a torn cartilage in the knee, surgery is often required to fix it. This is usually done with a surgical procedure less invasive than open surgery (arthroscopy). Some times open surgery of the knee is required if there is other damage.  PURPOSE OF THE MENISCUS  The medial meniscus rests on the medial tibial plateau. The tibia is the large bone in your lower leg (the shin bone). The medial tibial plateau is the upper end of the bone making up the inner part of your knee. The lateral meniscus serves the same purpose and is located on the outside of the knee. The menisci help to distribute your body weight across the knee joint; they act as shock absorbers. Without the meniscus present, the weight of your body would be unevenly applied to the bones in your legs (the femur and tibia). The femur is the large bone in your thigh. This uneven weight distribution would cause increased wear and tear on the cartilage lining the joint surfaces, leading to early damage (arthritis) of these areas. The presence of the menisci cartilage is necessary for a healthy knee.  PURPOSE OF THE KNEE CARTILAGE  The knee joint is made up of three bones: the thigh bone (femur), the shin bone (tibia), and the knee cap (patella). The surfaces of these bones  at the knee joint are covered with cartilage called articular cartilage. This smooth, slippery surface allows the bones to slide against each other without causing bone damage. The meniscus sits between these cartilaginous surfaces of the bones. It distributes the weight evenly in the joints and helps with the stability of the joint (keeps the joint steady).  HOME CARE INSTRUCTIONS  · Use crutches and external braces as instructed.  · Once home, an ice pack applied to your injured knee may help with discomfort and keep the swelling down. An ice pack can be used for the first couple of days or as instructed.  · Only take over-the-counter or prescription medicines for pain, discomfort, or fever as directed by your caregiver.  · Call if you do not have relief of pain with medications or if there is increasing in pain.  · Call if your foot becomes cold or blue.  · You may resume normal diet and activities as directed.  · Make sure to keep your appointments with your follow-up caregiver. This injury may require further evaluation and treatment beyond the temporary treatment given today.  Document Released: 08/03/2002 Document Revised: 09/27/2013 Document Reviewed: 11/25/2008  ExitCare® Patient Information ©2015 [employer]. This information is not intended to replace advice given to you by your health care provider. Make sure you discuss any questions you have with your health care provider.

## 2014-09-19 NOTE — Progress Notes (Signed)
Patient ID: Pamela Welch, female   DOB: 06-Jul-1965, 49 y.o.   MRN: [id number]  Assessment and Plan:  Hypertension:  -Continue medication,  -monitor blood pressure at home.  -Continue DASH diet.   -Reminder to go to the ER if any CP, SOB, nausea, dizziness, severe HA, changes vision/speech, left arm numbness and tingling, and jaw pain.  Cholesterol: -Continue diet and exercise.  -Check cholesterol.   Pre-diabetes: -Continue diet and exercise.  -Check A1C  Vitamin D Def: -check level -continue medications.    Right knee pain -suspected right meniscal tear -minimally symptomatic -discussed avoiding squatting and loading -tylenol prn -if becomes more symptomatic then will refer to ortho for likely injection.  Continue diet and meds as discussed. Further disposition pending results of labs.  HPI 49 y.o. female  presents for 3 month follow up with hypertension, hyperlipidemia, prediabetes and vitamin D.   Her blood pressure has been controlled at home, today their BP is BP: 104/68 mmHg.   She does workout. She denies chest pain, shortness of breath, dizziness.   She is on cholesterol medication and denies myalgias. Her cholesterol is at goal. The cholesterol last visit was:   Lab Results  Component Value Date   CHOL 150 04/27/2014   HDL 69 04/27/2014   LDLCALC 57 04/27/2014   TRIG 119 04/27/2014   CHOLHDL 2.2 04/27/2014     She has been working on diet and exercise for prediabetes, and denies foot ulcerations, hyperglycemia, hypoglycemia , increased appetite, paresthesia of the feet, polydipsia, polyuria, visual disturbances, vomiting and weight loss. Last A1C in the office was:  Lab Results  Component Value Date   HGBA1C 5.6 04/27/2014    Patient is on Vitamin D supplement.  Lab Results  Component Value Date   VD25OH 36 04/27/2014      Patient reports that she does have some right knee pain.  She reports that it is very painful to squat.  She has still been doing  P90X and has not been taking any medications for it.    Current Medications:  Current Outpatient Prescriptions on File Prior to Visit  Medication Sig Dispense Refill  . buPROPion (WELLBUTRIN XL) 300 MG 24 hr tablet Take 1 tablet (300 mg total) by mouth every morning. For mood 30 tablet prn  . CRESTOR 20 MG tablet TAKE 1 TABLET BY MOUTH EVERY DAY FOR CHOLETEROL 30 tablet 3  . famotidine (PEPCID) 40 MG tablet Take 40 mg by mouth at bedtime.    . fluticasone (FLONASE) 50 MCG/ACT nasal spray Place 2 sprays into both nostrils daily.    . hydrocortisone-pramoxine (ANALPRAM HC) 2.5-1 % rectal cream Place 1 application rectally 3 (three) times daily. 30 g 2  . ipratropium (ATROVENT) 0.06 % nasal spray Place 2 sprays into both nostrils 2 (two) times daily.    . Linaclotide (LINZESS) 145 MCG CAPS capsule Take 1 capsule (145 mcg total) by mouth daily. 30 capsule 6  . LORazepam (ATIVAN) 2 MG tablet TAKE 1/2 TO 1 TABLET BY MOUTH AT BEDTIME AS NEEDED FOR SLEEP 30 tablet 5  . montelukast (SINGULAIR) 10 MG tablet Take 10 mg by mouth at bedtime.    . pantoprazole (PROTONIX) 40 MG tablet TAKE 1 TABLET (40 MG TOTAL) BY MOUTH 2 (TWO) TIMES DAILY. 60 tablet 3  . traZODone (DESYREL) 150 MG tablet TAKE 1 TO 2 TABLET BY MOUTH AT BEDTIME AS NEEDED FOR SLEEP 60 tablet 2   No current facility-administered medications on file prior to visit.  Medical History:  Past Medical History  Diagnosis Date  . Chest pain, unspecified     normal myoview 2000;  echo 5/10:  mild LVH, EF 60-65%  . Palpitations     h/o symptomatic PVCs  . Restless legs syndrome (RLS)   . Persistent disorder of initiating or maintaining sleep   . Anxiety   . Depression   . Hyperlipidemia   . Back injury   . Barrett's esophagus   . Lumbago   . Insomnia   . HSV-1 (herpes simplex virus 1) infection   . DDD (degenerative disc disease)   . Palpitations   . Melanosis coli   . Hiatal hernia   . IBS (irritable bowel syndrome)   . GERD  (gastroesophageal reflux disease)   . Allergy     SEASONAL    Allergies:  Allergies  Allergen Reactions  . Ambien [Zolpidem Tartrate]     Memory issues  . Atenolol     unknown  . Cholestatin     unknown  . Mirapex [Pramipexole Dihydrochloride]     Hot flashes  . Requip [Ropinirole Hcl]     Hot flashes     Review of Systems:  Review of Systems  Constitutional: Negative for fever, chills and weight loss.  HENT: Negative for congestion, nosebleeds, sore throat and tinnitus.   Eyes: Negative.   Respiratory: Negative for cough, shortness of breath and wheezing.   Cardiovascular: Negative for chest pain, palpitations and leg swelling.  Gastrointestinal: Positive for heartburn. Negative for nausea, vomiting, abdominal pain, diarrhea, constipation, blood in stool and melena.  Genitourinary: Negative.   Musculoskeletal: Positive for joint pain. Negative for myalgias, back pain and neck pain.  Skin: Negative.   Neurological: Negative for dizziness, tingling, sensory change and headaches.  Psychiatric/Behavioral: The patient is nervous/anxious. The patient does not have insomnia.     Family history- Review and unchanged  Social history- Review and unchanged  Physical Exam: BP 104/68 mmHg  Pulse 82  Temp(Src) 98.2 F (36.8 C) (Temporal)  Resp 16  Ht 5\' 9"  (1.753 m)  Wt 153 lb (69.4 kg)  BMI 22.58 kg/m2  LMP 09/04/2014 Wt Readings from Last 3 Encounters:  09/19/14 153 lb (69.4 kg)  09/15/14 152 lb (68.947 kg)  04/27/14 149 lb 3.2 oz (67.677 kg)    General Appearance: Well nourished well developed, in no apparent distress. Eyes: PERRLA, EOMs, conjunctiva no swelling or erythema ENT/Mouth: Ear canals normal without obstruction, swelling, erythma, discharge.  TMs normal bilaterally.  Oropharynx moist, clear, without exudate, or postoropharyngeal swelling. Neck: Supple, thyroid normal,no cervical adenopathy  Respiratory: Respiratory effort normal, Breath sounds clear A&P  without rhonchi, wheeze, or rale.  No retractions, no accessory usage. Cardio: RRR with no MRGs. Brisk peripheral pulses without edema.  Abdomen: Soft, + BS,  Non tender, no guarding, rebound, hernias, masses. Musculoskeletal: Full ROM, 5/5 strength, Normal gait,  Right knee nontender to palpation.  There is positive McMurrays on the lateral edge.  Ligaments are intact.   Skin: Warm, dry without rashes, lesions, ecchymosis.  Neuro: Awake and oriented X 3, Cranial nerves intact. Normal muscle tone, no cerebellar symptoms. Psych: Normal affect, Insight and Judgment appropriate.    FORCUCCI, Raegen Tarpley, PA-C 9:36 AM [facility]

## 2014-09-20 LAB — BASIC METABOLIC PANEL WITH GFR
BUN: 14 mg/dL (ref 6–23)
CO2: 26 mEq/L (ref 19–32)
Calcium: 9.3 mg/dL (ref 8.4–10.5)
Chloride: 102 mEq/L (ref 96–112)
Creat: 1.13 mg/dL — ABNORMAL HIGH (ref 0.50–1.10)
GFR, Est African American: 66 mL/min
GFR, Est Non African American: 57 mL/min — ABNORMAL LOW
GLUCOSE: 79 mg/dL (ref 70–99)
POTASSIUM: 4 meq/L (ref 3.5–5.3)
Sodium: 141 mEq/L (ref 135–145)

## 2014-09-20 LAB — LIPID PANEL
Cholesterol: 155 mg/dL (ref 0–200)
HDL: 83 mg/dL (ref >=46)
LDL Cholesterol: 52 mg/dL (ref 0–99)
Total CHOL/HDL Ratio: 1.9 Ratio
Triglycerides: 101 mg/dL (ref <150)
VLDL: 20 mg/dL (ref 0–40)

## 2014-09-20 LAB — HEPATIC FUNCTION PANEL
ALT: 14 U/L (ref 0–35)
AST: 20 U/L (ref 0–37)
Albumin: 4 g/dL (ref 3.5–5.2)
Alkaline Phosphatase: 39 U/L (ref 39–117)
BILIRUBIN DIRECT: 0.1 mg/dL (ref 0.0–0.3)
BILIRUBIN TOTAL: 0.5 mg/dL (ref 0.2–1.2)
Indirect Bilirubin: 0.4 mg/dL (ref 0.2–1.2)
Total Protein: 6.8 g/dL (ref 6.0–8.3)

## 2014-09-20 LAB — INSULIN, RANDOM: Insulin: 6.2 u[IU]/mL (ref 2.0–19.6)

## 2014-09-20 LAB — MAGNESIUM: Magnesium: 1.7 mg/dL (ref 1.5–2.5)

## 2014-09-22 LAB — VITAMIN D 1,25 DIHYDROXY
Vitamin D 1, 25 (OH)2 Total: 30 pg/mL (ref 18–72)
Vitamin D2 1, 25 (OH)2: <8 pg/mL
Vitamin D3 1, 25 (OH)2: 30 pg/mL

## 2014-10-02 ENCOUNTER — Other Ambulatory Visit: Payer: Self-pay | Admitting: Internal Medicine

## 2014-10-07 ENCOUNTER — Other Ambulatory Visit: Payer: Self-pay | Admitting: *Deleted

## 2014-10-07 MED ORDER — FAMOTIDINE 40 MG PO TABS: 40.0000 mg | ORAL_TABLET | Freq: Every day | ORAL | Status: DC

## 2014-10-08 ENCOUNTER — Encounter: Payer: Self-pay | Admitting: *Deleted

## 2014-11-07 ENCOUNTER — Other Ambulatory Visit: Payer: Self-pay | Admitting: Internal Medicine

## 2014-12-04 ENCOUNTER — Other Ambulatory Visit: Payer: Self-pay | Admitting: Emergency Medicine

## 2014-12-05 ENCOUNTER — Other Ambulatory Visit: Payer: Self-pay | Admitting: Emergency Medicine

## 2014-12-05 ENCOUNTER — Other Ambulatory Visit: Payer: Self-pay | Admitting: Internal Medicine

## 2014-12-21 ENCOUNTER — Ambulatory Visit: Payer: Self-pay | Admitting: Internal Medicine

## 2014-12-22 ENCOUNTER — Encounter: Payer: Self-pay | Admitting: Internal Medicine

## 2014-12-22 ENCOUNTER — Ambulatory Visit (INDEPENDENT_AMBULATORY_CARE_PROVIDER_SITE_OTHER): Payer: BLUE CROSS/BLUE SHIELD | Admitting: Internal Medicine

## 2014-12-22 VITALS — BP 110/68 | HR 68 | Temp 97.2°F | Resp 16 | Ht 69.0 in | Wt 155.6 lb

## 2014-12-22 DIAGNOSIS — R0989 Other specified symptoms and signs involving the circulatory and respiratory systems: Secondary | ICD-10-CM | POA: Insufficient documentation

## 2014-12-22 DIAGNOSIS — M545 Low back pain, unspecified: Secondary | ICD-10-CM

## 2014-12-22 DIAGNOSIS — Z79899 Other long term (current) drug therapy: Secondary | ICD-10-CM

## 2014-12-22 DIAGNOSIS — Z6822 Body mass index (BMI) 22.0-22.9, adult: Secondary | ICD-10-CM

## 2014-12-22 DIAGNOSIS — R030 Elevated blood-pressure reading, without diagnosis of hypertension: Secondary | ICD-10-CM

## 2014-12-22 DIAGNOSIS — R7309 Other abnormal glucose: Secondary | ICD-10-CM

## 2014-12-22 DIAGNOSIS — E782 Mixed hyperlipidemia: Secondary | ICD-10-CM

## 2014-12-22 DIAGNOSIS — IMO0001 Reserved for inherently not codable concepts without codable children: Secondary | ICD-10-CM

## 2014-12-22 DIAGNOSIS — E559 Vitamin D deficiency, unspecified: Secondary | ICD-10-CM

## 2014-12-22 LAB — CBC WITH DIFFERENTIAL/PLATELET
Basophils Absolute: 0.1 10*3/uL (ref 0.0–0.1)
Basophils Relative: 1 % (ref 0–1)
Eosinophils Absolute: 0.2 10*3/uL (ref 0.0–0.7)
Eosinophils Relative: 3 % (ref 0–5)
HCT: 39.4 % (ref 36.0–46.0)
Hemoglobin: 13.1 g/dL (ref 12.0–15.0)
LYMPHS PCT: 31 % (ref 12–46)
Lymphs Abs: 1.8 10*3/uL (ref 0.7–4.0)
MCH: 29.9 pg (ref 26.0–34.0)
MCHC: 33.2 g/dL (ref 30.0–36.0)
MCV: 90 fL (ref 78.0–100.0)
MPV: 9.9 fL (ref 8.6–12.4)
Monocytes Absolute: 0.3 10*3/uL (ref 0.1–1.0)
Monocytes Relative: 6 % (ref 3–12)
NEUTROS PCT: 59 % (ref 43–77)
Neutro Abs: 3.4 10*3/uL (ref 1.7–7.7)
Platelets: 267 10*3/uL (ref 150–400)
RBC: 4.38 MIL/uL (ref 3.87–5.11)
RDW: 12.9 % (ref 11.5–15.5)
WBC: 5.8 10*3/uL (ref 4.0–10.5)

## 2014-12-22 MED ORDER — MELOXICAM 15 MG PO TABS: ORAL_TABLET | ORAL | Status: DC

## 2014-12-22 NOTE — Progress Notes (Signed)
Patient ID: Pamela Welch, female   DOB: 08-13-1965, 49 y.o.   MRN: [id number]   This very nice 49 y.o. DWF presents for 3 month follow up with Hypertension, Hyperlipidemia, Pre-Diabetes and Vitamin D Deficiency. Other problems include GERD controlled with diet & current meds. Also patient has hx/o DDD and chronic LBP limiting activities & sleep , but significantly improved on NSAD's. .   Patient is treated for HTN & BP has been controlled at home. Today's BP: 110/68 mmHg. Patient has had no complaints of any cardiac type chest pain, palpitations, dyspnea/orthopnea/PND, dizziness, claudication, or dependent edema.   Hyperlipidemia is controlled with diet & meds. Patient denies myalgias or other med SE's. Last Lipids were at goal - Cholesterol 155; HDL 83; LDL 52; Triglycerides 101 on 09/19/2014.   Also, the patient has history of  Elevated glucose and has had no symptoms of reactive hypoglycemia, diabetic polys, paresthesias or visual blurring.  Last A1c was 5.5% on 09/19/2014.    Further, the patient also has history of Vitamin D Deficiency of 30 in 2012 and sporadically supplements vitamin D without any suspected side-effects. Last vitamin D was still low at 36 on 04/27/2014.  Medication Sig  . buPROPion (WELLBUTRIN XL) 300 MG 24 hr tablet Take 1 tablet (300 mg total) by mouth every morning. For mood  . CRESTOR 20 MG tablet TAKE 1 TABLET BY MOUTH EVERY DAY FOR CHOLETEROL  . famotidine (PEPCID) 40 MG tablet Take 1 tablet (40 mg total) by mouth at bedtime.  . fluticasone (FLONASE) 50 MCG/ACT nasal spray Place 2 sprays into both nostrils daily.  . hydrocortisone-pramoxine (ANALPRAM HC) 2.5-1 % rectal cream Place 1 application rectally 3 (three) times daily.  Marland Kitchen ipratropium (ATROVENT) 0.06 % nasal spray Place 2 sprays into both nostrils 2 (two) times daily.  . Linaclotide (LINZESS) 145 MCG CAPS capsule Take 1 capsule (145 mcg total) by mouth daily.  Marland Kitchen LORazepam (ATIVAN) 2 MG tablet TAKE 1/2 TO 1 TABLET BY  MOUTH AT BEDTIME AS NEEDED FOR SLEEP  . montelukast (SINGULAIR) 10 MG tablet TAKE 1 TABLET EVERY DAY  . pantoprazole (PROTONIX) 40 MG tablet TAKE 1 TABLET (40 MG TOTAL) BY MOUTH 2 (TWO) TIMES DAILY.  . traZODone (DESYREL) 150 MG tablet TAKE 1 TO 2 TABLET BY MOUTH AT BEDTIME AS NEEDED FOR SLEEP   Allergies  Allergen Reactions  . Ambien [Zolpidem Tartrate]     Memory issues  . Atenolol     unknown  . Cholestatin     unknown  . Mirapex [Pramipexole Dihydrochloride]     Hot flashes  . Requip [Ropinirole Hcl]     Hot flashes   PMHx:   Past Medical History  Diagnosis Date  . Chest pain, unspecified     normal myoview 2000;  echo 5/10:  mild LVH, EF 60-65%  . Palpitations     h/o symptomatic PVCs  . Restless legs syndrome (RLS)   . Persistent disorder of initiating or maintaining sleep   . Anxiety   . Depression   . Hyperlipidemia   . Back injury   . Barrett's esophagus   . Lumbago   . Insomnia   . HSV-1 (herpes simplex virus 1) infection   . DDD (degenerative disc disease)   . Palpitations   . Melanosis coli   . Hiatal hernia   . IBS (irritable bowel syndrome)   . GERD (gastroesophageal reflux disease)   . Allergy     SEASONAL   Immunization History  Administered  Date(s) Administered  . DTaP 04/08/2012  . Influenza Split 04/15/2013, 04/27/2014  . Influenza-Unspecified 03/27/2012  . PPD Test 04/15/2013, 04/27/2014  . Pneumococcal-Unspecified 05/27/2009  . Td 05/27/2001   Past Surgical History  Procedure Laterality Date  . Tubes in ears      as an adult  . Refractive surgery      bilateral eyes   . Eye surgery  2001  . Colonoscopy  2013  . Esophagogastroduodenoscopy N/A 03/04/2014    Procedure: ESOPHAGOGASTRODUODENOSCOPY (EGD);  Surgeon: Ladene Artist, MD;  Location: [facility];  Service: Endoscopy;  Laterality: N/A;   FHx:    Reviewed / unchanged  SHx:    Reviewed / unchanged  Systems Review:  Constitutional: Denies fever, chills, wt changes,  headaches, insomnia, fatigue, night sweats, change in appetite. Eyes: Denies redness, blurred vision, diplopia, discharge, itchy, watery eyes.  ENT: Denies discharge, congestion, post nasal drip, epistaxis, sore throat, earache, hearing loss, dental pain, tinnitus, vertigo, sinus pain, snoring.  CV: Denies chest pain, palpitations, irregular heartbeat, syncope, dyspnea, diaphoresis, orthopnea, PND, claudication or edema. Respiratory: denies cough, dyspnea, DOE, pleurisy, hoarseness, laryngitis, wheezing.  Gastrointestinal: Denies dysphagia, odynophagia, heartburn, reflux, water brash, abdominal pain or cramps, nausea, vomiting, bloating, diarrhea, constipation, hematemesis, melena, hematochezia  or hemorrhoids. Genitourinary: Denies dysuria, frequency, urgency, nocturia, hesitancy, discharge, hematuria or flank pain. Musculoskeletal: Denies arthralgias, myalgias, stiffness, jt. swelling, pain, limping or strain/sprain.  Skin: Denies pruritus, rash, hives, warts, acne, eczema or change in skin lesion(s). Neuro: No weakness, tremor, incoordination, spasms, paresthesia or pain. Psychiatric: Denies confusion, memory loss or sensory loss. Endo: Denies change in weight, skin or hair change.  Heme/Lymph: No excessive bleeding, bruising or enlarged lymph nodes.  Physical Exam  BP 110/68 mmHg  Pulse 68  Temp(Src) 97.2 F (36.2 C)  Resp 16  Ht 5\' 9"  (1.753 m)  Wt 155 lb 9.6 oz (70.58 kg)  BMI 22.97 kg/m2  Appears well nourished and in no distress. Eyes: PERRLA, EOMs, conjunctiva no swelling or erythema. Sinuses: No frontal/maxillary tenderness ENT/Mouth: EAC's clear, TM's nl w/o erythema, bulging. Nares clear w/o erythema, swelling, exudates. Oropharynx clear without erythema or exudates. Oral hygiene is good. Tongue normal, non obstructing. Hearing intact.  Neck: Supple. Thyroid nl. Car 2+/2+ without bruits, nodes or JVD. Chest: Respirations nl with BS clear & equal w/o rales, rhonchi, wheezing  or stridor.  Cor: Heart sounds normal w/ regular rate and rhythm without sig. murmurs, gallops, clicks, or rubs. Peripheral pulses normal and equal  without edema.  Abdomen: Soft & bowel sounds normal. Non-tender w/o guarding, rebound, hernias, masses, or organomegaly.  Lymphatics: Unremarkable.  Musculoskeletal: Full ROM all peripheral extremities, joint stability, 5/5 strength, and normal gait.  Skin: Warm, dry without exposed rashes, lesions or ecchymosis apparent.  Neuro: Cranial nerves intact, reflexes equal bilaterally. Sensory-motor testing grossly intact. Tendon reflexes grossly intact.  Pysch: Alert & oriented x 3.  Insight and judgement nl & appropriate. No ideations.  Assessment and Plan:  1. Labile HTN  - TSH  2. Mixed hyperlipidemia  - Lipid panel  3. Abnormal glucose  - Hemoglobin A1c - Insulin, random  4. Vitamin D deficiency  - Vit D  25 hydroxy    5. Medication management  - CBC with Differential/Platelet - BASIC METABOLIC PANEL WITH GFR - Hepatic function panel - Magnesium  6. Body mass index (BMI) of 22.0-22.9 in adult   7. Midline low back pain without sciatica  - meloxicam (MOBIC) 15 MG tablet; Take 1/2 to 1  tablet daily with food for pain & inflammation  Dispense: 90 tablet; Refill: 99   Recommended regular exercise, BP monitoring, weight control, and discussed med and SE's. Recommended labs to assess and monitor clinical status. Further disposition pending results of labs. Over 30 minutes of exam, counseling, chart review was performed

## 2014-12-23 LAB — BASIC METABOLIC PANEL WITH GFR
BUN: 20 mg/dL (ref 7–25)
CO2: 26 mEq/L (ref 20–31)
Calcium: 9 mg/dL (ref 8.6–10.4)
Chloride: 104 mEq/L (ref 98–110)
Creat: 1.16 mg/dL — ABNORMAL HIGH (ref 0.50–1.05)
GFR, Est African American: 63 mL/min (ref >=60)
GFR, Est Non African American: 55 mL/min — ABNORMAL LOW (ref >=60)
Glucose, Bld: 66 mg/dL (ref 65–99)
Potassium: 4.8 mEq/L (ref 3.5–5.3)
Sodium: 141 mEq/L (ref 135–146)

## 2014-12-23 LAB — HEPATIC FUNCTION PANEL
ALT: 16 U/L (ref 6–29)
AST: 20 U/L (ref 10–35)
Albumin: 4.3 g/dL (ref 3.6–5.1)
Alkaline Phosphatase: 47 U/L (ref 33–130)
BILIRUBIN DIRECT: 0.1 mg/dL (ref <=0.2)
BILIRUBIN INDIRECT: 0.3 mg/dL (ref 0.2–1.2)
Total Bilirubin: 0.4 mg/dL (ref 0.2–1.2)
Total Protein: 7 g/dL (ref 6.1–8.1)

## 2014-12-23 LAB — LIPID PANEL
CHOL/HDL RATIO: 2.5 ratio (ref <=5.0)
Cholesterol: 160 mg/dL (ref 125–200)
HDL: 65 mg/dL (ref >=46)
LDL Cholesterol: 44 mg/dL (ref <130)
Triglycerides: 257 mg/dL — ABNORMAL HIGH (ref <150)
VLDL: 51 mg/dL — AB (ref <30)

## 2014-12-23 LAB — HEMOGLOBIN A1C
HEMOGLOBIN A1C: 5.5 % (ref <5.7)
MEAN PLASMA GLUCOSE: 111 mg/dL (ref <117)

## 2014-12-23 LAB — INSULIN, RANDOM: Insulin: 8.6 u[IU]/mL (ref 2.0–19.6)

## 2014-12-23 LAB — VITAMIN D 25 HYDROXY (VIT D DEFICIENCY, FRACTURES): Vit D, 25-Hydroxy: 50 ng/mL (ref 30–100)

## 2014-12-23 LAB — TSH: TSH: 1.836 u[IU]/mL (ref 0.350–4.500)

## 2014-12-23 LAB — MAGNESIUM: Magnesium: 1.8 mg/dL (ref 1.5–2.5)

## 2015-01-04 ENCOUNTER — Other Ambulatory Visit: Payer: Self-pay | Admitting: Internal Medicine

## 2015-01-04 ENCOUNTER — Other Ambulatory Visit: Payer: Self-pay | Admitting: Physician Assistant

## 2015-01-04 DIAGNOSIS — G4700 Insomnia, unspecified: Secondary | ICD-10-CM

## 2015-01-13 ENCOUNTER — Other Ambulatory Visit: Payer: Self-pay | Admitting: Internal Medicine

## 2015-01-13 DIAGNOSIS — E782 Mixed hyperlipidemia: Secondary | ICD-10-CM

## 2015-01-28 IMAGING — CR DG CHEST 2V
2 series · 2 of 2 positions shown · non-contrast
Comparison: 08/28/2012

CLINICAL DATA: Cough for 4 months.  Short of breath.

EXAM:
CHEST  2 VIEW

[view not recorded (1 of 2)]
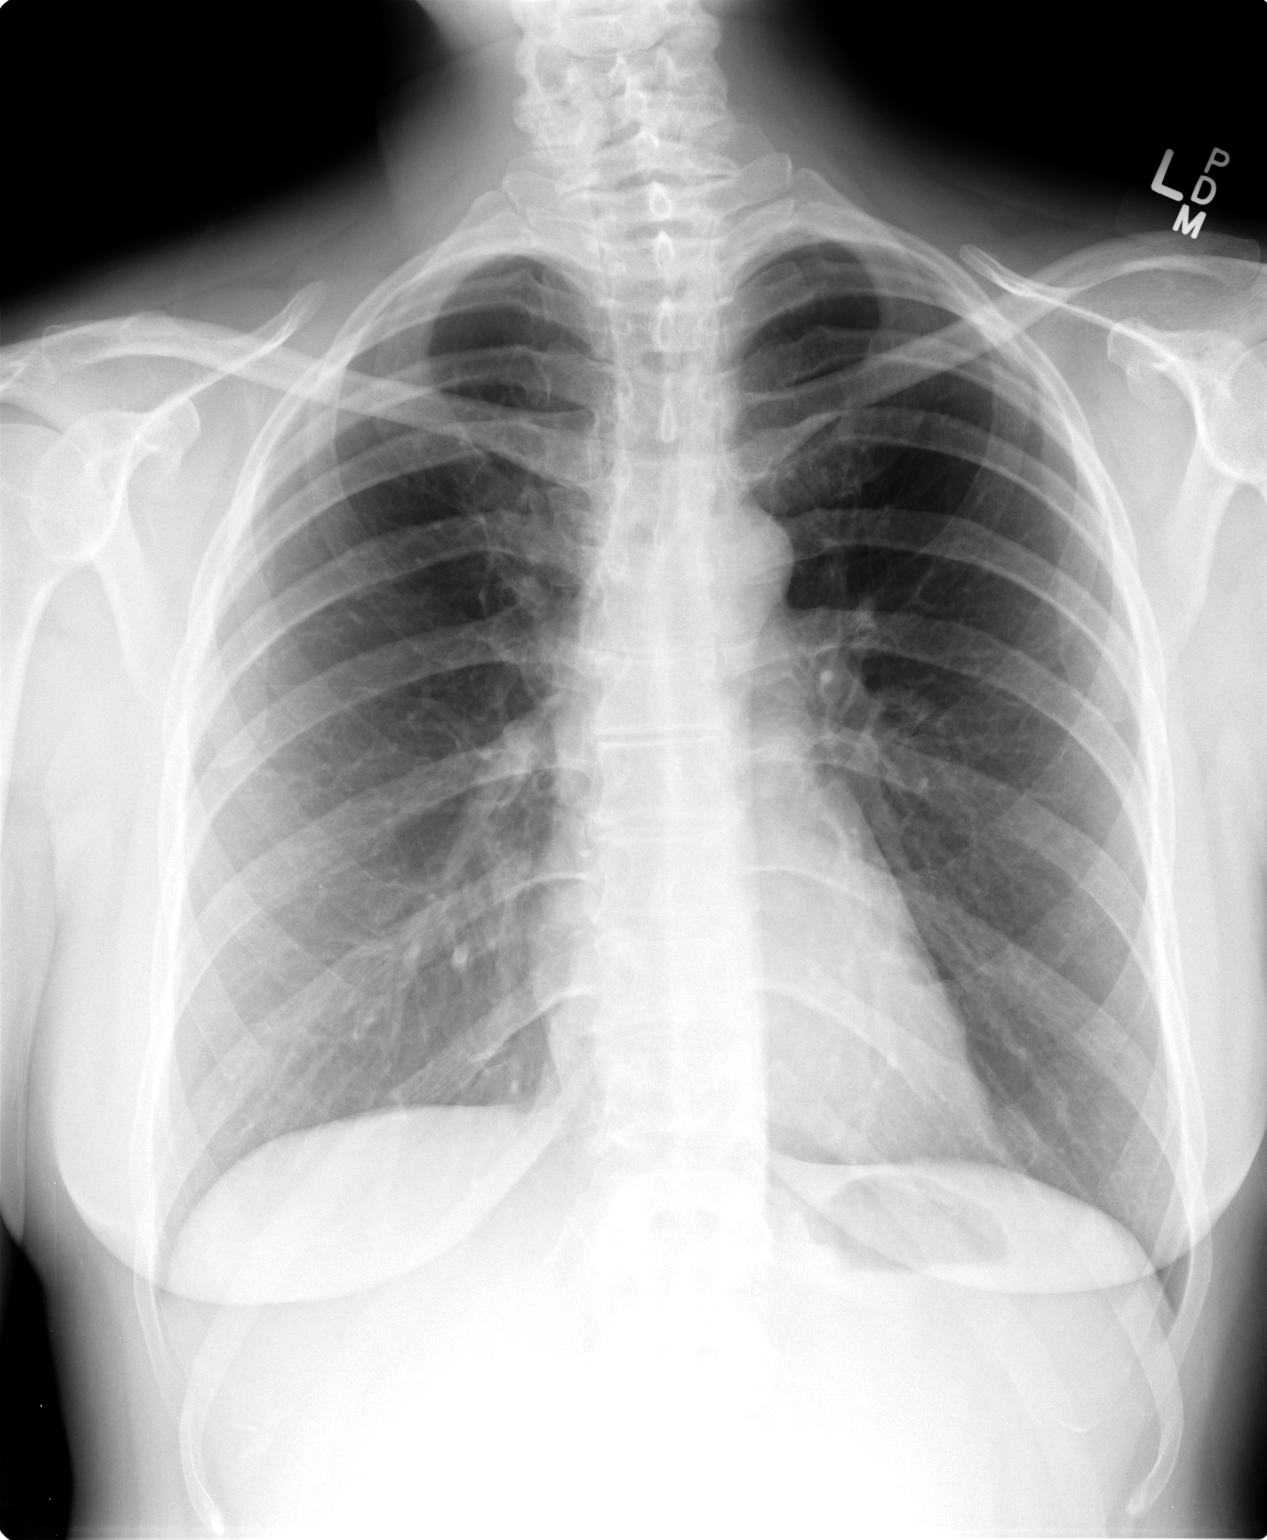

[view not recorded (2 of 2)]
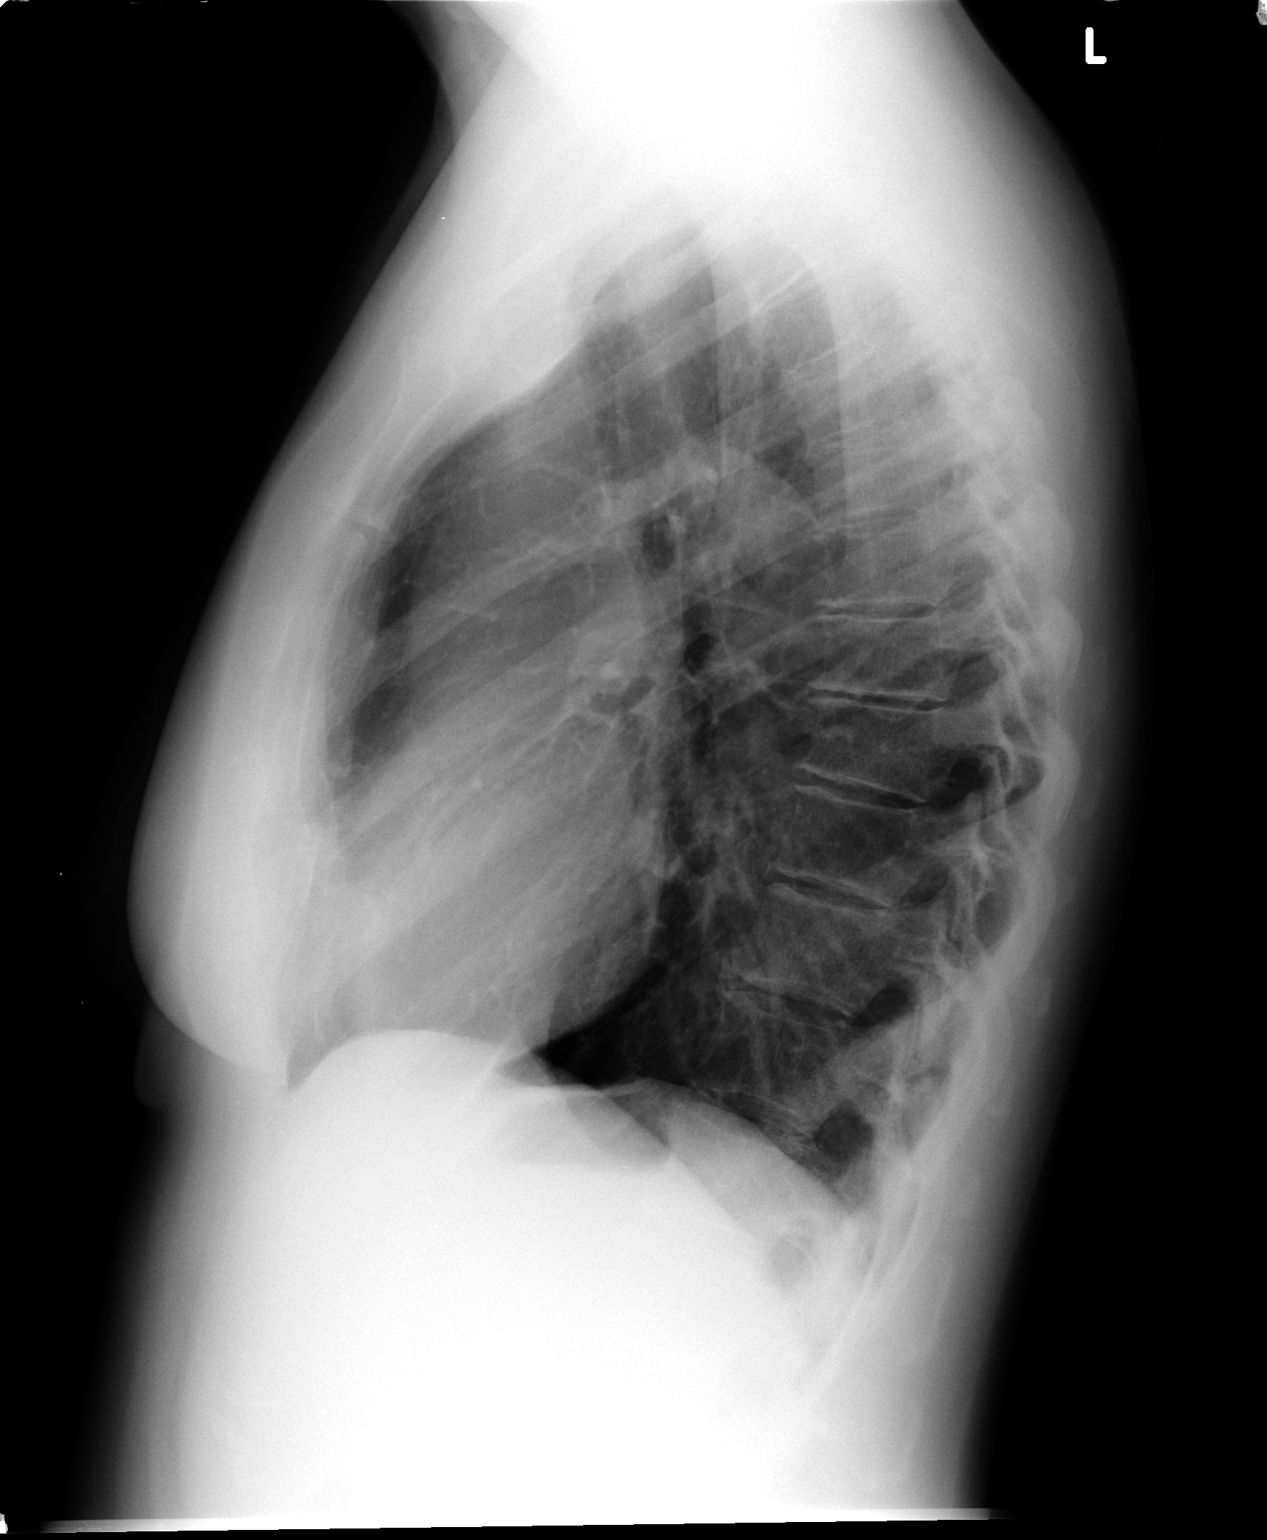

[2 of 2 positions shown; findings below may reference images not displayed]

FINDINGS: The heart size and mediastinal contours are within normal limits.
Both lungs are clear. The bony thorax is intact. No change from the
prior study.
IMPRESSION: No active cardiopulmonary disease.

## 2015-02-01 ENCOUNTER — Other Ambulatory Visit: Payer: Self-pay | Admitting: Internal Medicine

## 2015-02-28 ENCOUNTER — Other Ambulatory Visit: Payer: Self-pay | Admitting: Internal Medicine

## 2015-02-28 ENCOUNTER — Other Ambulatory Visit: Payer: Self-pay | Admitting: Gastroenterology

## 2015-03-01 ENCOUNTER — Other Ambulatory Visit: Payer: Self-pay | Admitting: Internal Medicine

## 2015-03-01 DIAGNOSIS — G4700 Insomnia, unspecified: Secondary | ICD-10-CM

## 2015-03-01 MED ORDER — LORAZEPAM 2 MG PO TABS: ORAL_TABLET | ORAL | Status: DC

## 2015-03-25 ENCOUNTER — Other Ambulatory Visit: Payer: Self-pay | Admitting: Gastroenterology

## 2015-03-27 ENCOUNTER — Ambulatory Visit: Payer: Self-pay | Admitting: Physician Assistant

## 2015-03-28 ENCOUNTER — Other Ambulatory Visit: Payer: Self-pay | Admitting: Gastroenterology

## 2015-03-31 ENCOUNTER — Ambulatory Visit (INDEPENDENT_AMBULATORY_CARE_PROVIDER_SITE_OTHER): Payer: BLUE CROSS/BLUE SHIELD | Admitting: Physician Assistant

## 2015-03-31 ENCOUNTER — Encounter: Payer: Self-pay | Admitting: Physician Assistant

## 2015-03-31 VITALS — BP 128/60 | HR 76 | Temp 97.3°F | Resp 14 | Ht 69.0 in | Wt 158.0 lb

## 2015-03-31 DIAGNOSIS — E785 Hyperlipidemia, unspecified: Secondary | ICD-10-CM

## 2015-03-31 DIAGNOSIS — Z79899 Other long term (current) drug therapy: Secondary | ICD-10-CM | POA: Diagnosis not present

## 2015-03-31 DIAGNOSIS — R7309 Other abnormal glucose: Secondary | ICD-10-CM

## 2015-03-31 DIAGNOSIS — E559 Vitamin D deficiency, unspecified: Secondary | ICD-10-CM

## 2015-03-31 DIAGNOSIS — IMO0001 Reserved for inherently not codable concepts without codable children: Secondary | ICD-10-CM

## 2015-03-31 DIAGNOSIS — R030 Elevated blood-pressure reading, without diagnosis of hypertension: Secondary | ICD-10-CM | POA: Diagnosis not present

## 2015-03-31 DIAGNOSIS — E782 Mixed hyperlipidemia: Secondary | ICD-10-CM | POA: Diagnosis not present

## 2015-03-31 LAB — CBC WITH DIFFERENTIAL/PLATELET
BASOS PCT: 1 % (ref 0–1)
Basophils Absolute: 0.1 10*3/uL (ref 0.0–0.1)
EOS ABS: 0.2 10*3/uL (ref 0.0–0.7)
EOS PCT: 3 % (ref 0–5)
HCT: 37.6 % (ref 36.0–46.0)
Hemoglobin: 12.7 g/dL (ref 12.0–15.0)
LYMPHS ABS: 1.7 10*3/uL (ref 0.7–4.0)
Lymphocytes Relative: 28 % (ref 12–46)
MCH: 29.5 pg (ref 26.0–34.0)
MCHC: 33.8 g/dL (ref 30.0–36.0)
MCV: 87.4 fL (ref 78.0–100.0)
MONO ABS: 0.4 10*3/uL (ref 0.1–1.0)
MONOS PCT: 7 % (ref 3–12)
MPV: 9.8 fL (ref 8.6–12.4)
NEUTROS PCT: 61 % (ref 43–77)
Neutro Abs: 3.7 10*3/uL (ref 1.7–7.7)
PLATELETS: 253 10*3/uL (ref 150–400)
RBC: 4.3 MIL/uL (ref 3.87–5.11)
RDW: 13 % (ref 11.5–15.5)
WBC: 6 10*3/uL (ref 4.0–10.5)

## 2015-03-31 LAB — HEPATIC FUNCTION PANEL
ALK PHOS: 42 U/L (ref 33–130)
ALT: 11 U/L (ref 6–29)
AST: 18 U/L (ref 10–35)
Albumin: 4 g/dL (ref 3.6–5.1)
BILIRUBIN DIRECT: 0.1 mg/dL (ref <=0.2)
BILIRUBIN INDIRECT: 0.2 mg/dL (ref 0.2–1.2)
BILIRUBIN TOTAL: 0.3 mg/dL (ref 0.2–1.2)
TOTAL PROTEIN: 6.5 g/dL (ref 6.1–8.1)

## 2015-03-31 LAB — LIPID PANEL
Cholesterol: 157 mg/dL (ref 125–200)
HDL: 70 mg/dL (ref >=46)
LDL CALC: 67 mg/dL (ref <130)
TRIGLYCERIDES: 99 mg/dL (ref <150)
Total CHOL/HDL Ratio: 2.2 Ratio (ref <=5.0)
VLDL: 20 mg/dL (ref <30)

## 2015-03-31 LAB — BASIC METABOLIC PANEL WITH GFR
BUN: 15 mg/dL (ref 7–25)
CALCIUM: 8.9 mg/dL (ref 8.6–10.4)
CHLORIDE: 103 mmol/L (ref 98–110)
CO2: 30 mmol/L (ref 20–31)
CREATININE: 1.02 mg/dL (ref 0.50–1.05)
GFR, Est African American: 74 mL/min (ref >=60)
GFR, Est Non African American: 64 mL/min (ref >=60)
Glucose, Bld: 75 mg/dL (ref 65–99)
Potassium: 4.5 mmol/L (ref 3.5–5.3)
SODIUM: 138 mmol/L (ref 135–146)

## 2015-03-31 LAB — MAGNESIUM: Magnesium: 1.8 mg/dL (ref 1.5–2.5)

## 2015-03-31 LAB — TSH: TSH: 2.289 u[IU]/mL (ref 0.350–4.500)

## 2015-03-31 MED ORDER — LINACLOTIDE 145 MCG PO CAPS: 145.0000 ug | ORAL_CAPSULE | Freq: Every day | ORAL | Status: DC

## 2015-03-31 MED ORDER — BUPROPION HCL ER (XL) 150 MG PO TB24: 150.0000 mg | ORAL_TABLET | ORAL | Status: DC

## 2015-03-31 MED ORDER — ESCITALOPRAM OXALATE 10 MG PO TABS: 10.0000 mg | ORAL_TABLET | Freq: Every day | ORAL | Status: DC

## 2015-03-31 NOTE — Progress Notes (Signed)
Assessment and Plan:  1. Hypertension -Continue medication, monitor blood pressure at home. Continue DASH diet.  Reminder to go to the ER if any CP, SOB, nausea, dizziness, severe HA, changes vision/speech, left arm numbness and tingling and jaw pain.  2. Cholesterol -Continue diet and exercise. Check cholesterol.   3. Prediabetes  -Continue diet and exercise. Check A1C  4. Vitamin D Def - check level and continue medications.   5. Menopause/depression Cut back on wellbutrin to 150XL, add lexapro 10mg , follow up 1 month  Continue diet and meds as discussed. Further disposition pending results of labs. Over 30 minutes of exam, counseling, chart review, and critical decision making was performed  Future Appointments Date Time Provider [place]  06/30/2015 11:15 AM Unk Pinto, MD [facility] None     HPI 49 y.o. female  presents for 3 month follow up on hypertension, cholesterol, prediabetes, and vitamin D deficiency.   Her blood pressure has been controlled at home, today their BP is BP: 128/60 mmHg  She does workout. She denies chest pain, shortness of breath, dizziness.  She is on cholesterol medication and denies myalgias. Her cholesterol is at goal. The cholesterol last visit was:   Lab Results  Component Value Date   CHOL 160 12/22/2014   HDL 65 12/22/2014   LDLCALC 44 12/22/2014   TRIG 257* 12/22/2014   CHOLHDL 2.5 12/22/2014    She has been working on diet and exercise for prediabetes, and denies paresthesia of the feet, polydipsia, polyuria and visual disturbances. Last A1C in the office was:  Lab Results  Component Value Date   HGBA1C 5.5 12/22/2014   Patient is on Vitamin D supplement.   Lab Results  Component Value Date   VD25OH 50 12/22/2014     Has been having irregular periods since her 49th birthday, will have 40 days at a time. She has been having breast tenderness, quick tempered and feeling depressed.   Current Medications:  Current  Outpatient Prescriptions on File Prior to Visit  Medication Sig Dispense Refill  . buPROPion (WELLBUTRIN XL) 300 MG 24 hr tablet Take 1 tablet (300 mg total) by mouth every morning. For mood 30 tablet prn  . CRESTOR 20 MG tablet TAKE 1 TABLET BY MOUTH EVERY DAY FOR CHOLESTEROL 90 tablet 1  . famotidine (PEPCID) 40 MG tablet TAKE 1 TABLET BY MOUTH EVERY DAY AT BEDTIME 30 tablet 3  . fluticasone (FLONASE) 50 MCG/ACT nasal spray Place 2 sprays into both nostrils daily.    . hydrocortisone-pramoxine (ANALPRAM HC) 2.5-1 % rectal cream Place 1 application rectally 3 (three) times daily. 30 g 2  . ipratropium (ATROVENT) 0.06 % nasal spray Place 2 sprays into both nostrils 2 (two) times daily.    . Linaclotide (LINZESS) 145 MCG CAPS capsule Take 1 capsule (145 mcg total) by mouth daily. 30 capsule 6  . LINZESS 145 MCG CAPS capsule TAKE 1 CAPSULE EVERY DAY 30 capsule 5  . LINZESS 145 MCG CAPS capsule TAKE 1 CAPSULE BY MOUTH ONCE DAILY 30 capsule 0  . LORazepam (ATIVAN) 2 MG tablet Take 1/2 to 1 tablet at bedtime if needed for sleep 90 tablet 1  . meloxicam (MOBIC) 15 MG tablet Take 1/2 to 1 tablet daily with food for pain & inflammation 90 tablet 99  . montelukast (SINGULAIR) 10 MG tablet TAKE 1 TABLET EVERY DAY 30 tablet 11  . pantoprazole (PROTONIX) 40 MG tablet TAKE 1 TABLET BY MOUTH TWICE A DAY 60 tablet 3  . traZODone (  DESYREL) 150 MG tablet TAKE 1 TO 2 TABLET BY MOUTH AT BEDTIME AS NEEDED FOR SLEEP 60 tablet 2  . traZODone (DESYREL) 150 MG tablet TAKE 1 TO 2 TABLETS BY MOUTH 1 HOUR BEFORE SLEEP 180 tablet 1   No current facility-administered medications on file prior to visit.   Medical History:  Past Medical History  Diagnosis Date  . Chest pain, unspecified     normal myoview 2000;  echo 5/10:  mild LVH, EF 60-65%  . Palpitations     h/o symptomatic PVCs  . Restless legs syndrome (RLS)   . Persistent disorder of initiating or maintaining sleep   . Anxiety   . Depression   .  Hyperlipidemia   . Back injury   . Barrett's esophagus   . Lumbago   . Insomnia   . HSV-1 (herpes simplex virus 1) infection   . DDD (degenerative disc disease)   . Palpitations   . Melanosis coli   . Hiatal hernia   . IBS (irritable bowel syndrome)   . GERD (gastroesophageal reflux disease)   . Allergy     SEASONAL   Allergies:  Allergies  Allergen Reactions  . Ambien [Zolpidem Tartrate]     Memory issues  . Atenolol     unknown  . Cholestatin     unknown  . Mirapex [Pramipexole Dihydrochloride]     Hot flashes  . Requip [Ropinirole Hcl]     Hot flashes     Review of Systems:  Review of Systems  Constitutional: Negative for fever, chills and weight loss.  HENT: Negative for congestion, nosebleeds, sore throat and tinnitus.   Eyes: Negative.   Respiratory: Negative for cough, shortness of breath and wheezing.   Cardiovascular: Negative for chest pain, palpitations and leg swelling.  Gastrointestinal: Positive for heartburn. Negative for nausea, vomiting, abdominal pain, diarrhea, constipation, blood in stool and melena.  Genitourinary: Negative.   Musculoskeletal: Positive for joint pain. Negative for myalgias, back pain and neck pain.  Skin: Negative.   Neurological: Negative for dizziness, tingling, sensory change and headaches.  Psychiatric/Behavioral: Positive for depression. Negative for suicidal ideas, hallucinations, memory loss and substance abuse. The patient is nervous/anxious. The patient does not have insomnia.     Family history- Review and unchanged Social history- Review and unchanged Physical Exam: BP 128/60 mmHg  Pulse 76  Temp(Src) 97.3 F (36.3 C) (Temporal)  Resp 14  Ht 5\' 9"  (1.753 m)  Wt 158 lb (71.668 kg)  BMI 23.32 kg/m2  SpO2 99%  LMP 03/16/2015 Wt Readings from Last 3 Encounters:  03/31/15 158 lb (71.668 kg)  12/22/14 155 lb 9.6 oz (70.58 kg)  09/19/14 153 lb (69.4 kg)   General Appearance: Well nourished, in no apparent  distress. Eyes: PERRLA, EOMs, conjunctiva no swelling or erythema Sinuses: No Frontal/maxillary tenderness ENT/Mouth: Ext aud canals clear, TMs without erythema, bulging. No erythema, swelling, or exudate on post pharynx.  Tonsils not swollen or erythematous. Hearing normal.  Neck: Supple, thyroid normal.  Respiratory: Respiratory effort normal, BS equal bilaterally without rales, rhonchi, wheezing or stridor.  Cardio: RRR with no MRGs. Brisk peripheral pulses without edema.  Abdomen: Soft, + BS,  Non tender, no guarding, rebound, hernias, masses. Lymphatics: Non tender without lymphadenopathy.  Musculoskeletal: Full ROM, 5/5 strength, Normal gait Skin: Warm, dry without rashes, lesions, ecchymosis.  Neuro: Cranial nerves intact. Normal muscle tone, no cerebellar symptoms. Psych: Awake and oriented X 3, normal affect, Insight and Judgment appropriate.    Vicie Mutters,  PA-C 9:22 AM [facility]

## 2015-03-31 NOTE — Patient Instructions (Signed)
Cut the wellbutrin in half and add on lexapro 10mg  daily  VAGINAL DRYNESS OVERVIEW  Vaginal dryness, also known as atrophic vaginitis, is a common condition in postmenopausal women. This condition is also common in women who have had both ovaries removed at the time of hysterectomy.   Some women have uncomfortable symptoms of vaginal dryness, such as pain with sex, burning vaginal discomfort or itching, or abnormal vaginal discharge, while others have no symptoms at all.  VAGINAL DRYNESS CAUSES   Estrogen helps to keep the vagina moist and to maintain thickness of the vaginal lining. Vaginal dryness occurs when the ovaries produce a decreased amount of estrogen. This can occur at certain times in a woman's life, and may be permanent or temporary. Times when less estrogen is made include: ?At the time of menopause. ?After surgical removal of the ovaries, chemotherapy, or radiation therapy of the pelvis for cancer. ?After having a baby, particularly in women who breastfeed. ?While using certain medications, such as danazol, medroxyprogesterone (brand names: Provera or DepoProvera), leuprolide (brand name: Lupron), or nafarelin. When these medications are stopped, estrogen production resumes.  Women who smoke cigarettes have been shown to have an increased risk of an earlier menopause transition as compared to non-smokers. Therefore, atrophic vaginitis symptoms may appear at a younger age in this population.  VAGINAL DRYNESS TREATMENT   There are three treatment options for women with vaginal dryness:  Vaginal lubricants and moisturizers - Vaginal lubricants and moisturizers can be purchased without a prescription. These products do not contain any hormones and have virtually no side effects. - Albolene is found in the facial cleanser section at [facility], [facility], or [employer]. It is a large jar with a blue top. This is the best lubricant for women because it is hypoallergenic. -Natural lubricants, such  as olive, avocado or peanut oil, are easily available products that may be used as a lubricant with sex.  -Vaginal moisturizes (eg, Replens, Moist Again, Vagisil, K-Y Silk-E, and Feminease) are formulated to allow water to be retained in the vaginal tissues. Moisturizers are applied into the vagina three times weekly to allow a continued moisturizing effect. These should not be used just before having sex, as they can be irritating.  Vaginal estrogen - Vaginal estrogen is the most effective treatment option for women with vaginal dryness. Vaginal estrogen must be prescribed by a healthcare provider. Very low doses of vaginal estrogen can be used when it is put into the vagina to treat vaginal dryness. A small amount of estrogen is absorbed into the bloodstream, but only about 100 times less than when using estrogen pills or tablets. As a result, there is a much lower risk of side effects, such as blood clots, breast cancer, and heart attack, compared with other estrogen-containing products (birth control pills, menopausal hormone therapy).   Ospemifene - Ospemifene is a prescription medication that is similar to estrogen, but is not estrogen. In the vaginal tissue, it acts similarly to estrogen. In the breast tissue, it acts as an estrogen blocker. It comes in a pill, and is prescribed for women who want to use an estrogen-like medication for vaginal dryness or painful sex associated with vaginal dryness, but prefer not to use a vaginal medication. The medication may cause hot flashes as a side effect. This type of medication may increase the risk of blood clots or uterine cancer. Further study of ospemifene is needed to evaluate the risk of these complications. This medication has not been tested in women who  have had breast cancer or are at a high risk of developing breast cancer.    Sexual activity - Vaginal estrogen improves vaginal dryness quickly, usually within a few weeks. You may continue to have  sex as you treat vaginal dryness because sex itself can help to keep the vaginal tissues healthy. Vaginal intercourse may help the vaginal tissues by keeping them soft and stretchable and preventing the tissues from shrinking.  If sex continues to be painful despite treatment for vaginal dryness, talk to your healthcare provider.

## 2015-04-01 LAB — VITAMIN D 25 HYDROXY (VIT D DEFICIENCY, FRACTURES): Vit D, 25-Hydroxy: 48 ng/mL (ref 30–100)

## 2015-04-04 ENCOUNTER — Encounter: Payer: Self-pay | Admitting: Physician Assistant

## 2015-04-25 ENCOUNTER — Other Ambulatory Visit: Payer: Self-pay | Admitting: Internal Medicine

## 2015-04-25 ENCOUNTER — Encounter: Payer: Self-pay | Admitting: Physician Assistant

## 2015-05-10 ENCOUNTER — Encounter: Payer: Self-pay | Admitting: Internal Medicine

## 2015-05-16 ENCOUNTER — Encounter: Payer: Self-pay | Admitting: Internal Medicine

## 2015-05-16 ENCOUNTER — Ambulatory Visit (INDEPENDENT_AMBULATORY_CARE_PROVIDER_SITE_OTHER): Payer: BLUE CROSS/BLUE SHIELD | Admitting: Internal Medicine

## 2015-05-16 VITALS — BP 112/60 | HR 72 | Temp 98.0°F | Resp 16 | Ht 69.0 in | Wt 162.0 lb

## 2015-05-16 DIAGNOSIS — R413 Other amnesia: Secondary | ICD-10-CM

## 2015-05-16 LAB — CBC WITH DIFFERENTIAL/PLATELET
BASOS ABS: 0.1 10*3/uL (ref 0.0–0.1)
Basophils Relative: 1 % (ref 0–1)
EOS ABS: 0.1 10*3/uL (ref 0.0–0.7)
EOS PCT: 2 % (ref 0–5)
HEMATOCRIT: 38.1 % (ref 36.0–46.0)
HEMOGLOBIN: 12.7 g/dL (ref 12.0–15.0)
LYMPHS ABS: 1.9 10*3/uL (ref 0.7–4.0)
LYMPHS PCT: 31 % (ref 12–46)
MCH: 29.5 pg (ref 26.0–34.0)
MCHC: 33.3 g/dL (ref 30.0–36.0)
MCV: 88.6 fL (ref 78.0–100.0)
MONO ABS: 0.5 10*3/uL (ref 0.1–1.0)
MPV: 9.7 fL (ref 8.6–12.4)
Monocytes Relative: 8 % (ref 3–12)
Neutro Abs: 3.5 10*3/uL (ref 1.7–7.7)
Neutrophils Relative %: 58 % (ref 43–77)
Platelets: 284 10*3/uL (ref 150–400)
RBC: 4.3 MIL/uL (ref 3.87–5.11)
RDW: 13.1 % (ref 11.5–15.5)
WBC: 6.1 10*3/uL (ref 4.0–10.5)

## 2015-05-16 LAB — IRON AND TIBC
%SAT: 17 % (ref 11–50)
IRON: 61 ug/dL (ref 45–160)
TIBC: 367 ug/dL (ref 250–450)
UIBC: 306 ug/dL (ref 125–400)

## 2015-05-16 LAB — BASIC METABOLIC PANEL WITH GFR
BUN: 11 mg/dL (ref 7–25)
CALCIUM: 8.6 mg/dL (ref 8.6–10.4)
CHLORIDE: 102 mmol/L (ref 98–110)
CO2: 25 mmol/L (ref 20–31)
CREATININE: 1.02 mg/dL (ref 0.50–1.05)
GFR, Est African American: 74 mL/min (ref >=60)
GFR, Est Non African American: 64 mL/min (ref >=60)
GLUCOSE: 75 mg/dL (ref 65–99)
Potassium: 4.3 mmol/L (ref 3.5–5.3)
SODIUM: 138 mmol/L (ref 135–146)

## 2015-05-16 NOTE — Progress Notes (Signed)
Subjective:    Patient ID: Pamela Welch, female    DOB: [birth year], 49 y.o.   MRN: [id number]  HPI  Patient presents to the office for evaluation of memory loss.  She reports that over the past 6 months she has been trouble finding objects, she is transposing words despite knowing what she wants to say, and she cannot recall names.  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  She reports that the decline has been slow over the past year but is accelerating over the past 6 months.  She realizes that this is something that seems subjective to her.  Her mother hasn't noted anything.  She reports that her dad has beginning stages of dementia.  She has not noticed any changes in balance or new changes in numbness.  She does feel like her balance issues are slightly worse over the last 6 months.  She does note that she does have previous back issues.  She reports that she is not sleeping well.  She reports that she has a very stressful job and her kids are about to go to college.  She reports that she feels her stress levels are normal.  She has never been to a neurologist in the past.    Review of Systems  Constitutional: Negative for fever, chills and fatigue.  HENT: Negative for drooling.   Eyes: Negative for photophobia and visual disturbance.  Respiratory: Negative for chest tightness and shortness of breath.   Gastrointestinal: Negative for nausea, vomiting, abdominal pain, diarrhea, constipation and blood in stool.  Neurological: Positive for speech difficulty and headaches. Negative for tremors, syncope, weakness and numbness.  Psychiatric/Behavioral: Positive for confusion and decreased concentration. Negative for suicidal ideas, hallucinations, sleep disturbance and self-injury. The patient is not nervous/anxious.        Objective:   Physical Exam  Constitutional: She is oriented to person, place, and time. She appears well-developed  and well-nourished. No distress.  HENT:  Head: Normocephalic.  Mouth/Throat: Oropharynx is clear and moist. No oropharyngeal exudate.  Eyes: Conjunctivae are normal. No scleral icterus.  Neck: Normal range of motion. Neck supple. No JVD present. No thyromegaly present.  Cardiovascular: Normal rate, regular rhythm, normal heart sounds and intact distal pulses.  Exam reveals no gallop and no friction rub.   No murmur heard. Pulmonary/Chest: Effort normal and breath sounds normal. No respiratory distress. She has no wheezes. She has no rales. She exhibits no tenderness.  Abdominal: Soft. Bowel sounds are normal. She exhibits no distension and no mass. There is no tenderness. There is no rebound and no guarding.  Musculoskeletal: Normal range of motion.  Lymphadenopathy:    She has no cervical adenopathy.  Neurological: She is alert and oriented to person, place, and time. She has normal strength. No cranial nerve deficit or sensory deficit. Coordination normal.  Skin: Skin is warm and dry. She is not diaphoretic.  Psychiatric: She has a normal mood and affect. Her behavior is normal. Judgment and thought content normal.  Nursing note and vitals reviewed.   Filed Vitals:   05/16/15 1534  BP: 112/60  Pulse: 72  Temp: 98 F (36.7 C)  Resp: 16        Assessment & Plan:    1. Memory loss  - Vitamin B12 - Iron and TIBC - CBC with Differential/Platelet - BASIC METABOLIC PANEL WITH GFR - CT Head Wo Contrast; Future - Ambulatory referral  to Neurology

## 2015-05-17 LAB — VITAMIN B12: Vitamin B-12: 540 pg/mL (ref 211–911)

## 2015-05-24 ENCOUNTER — Other Ambulatory Visit: Payer: Self-pay | Admitting: Internal Medicine

## 2015-05-30 ENCOUNTER — Ambulatory Visit
Admission: RE | Admit: 2015-05-30 | Discharge: 2015-05-30 | Disposition: A | Discharge status: Home / Self Care | Payer: BLUE CROSS/BLUE SHIELD | Source: Ambulatory Visit | Attending: Internal Medicine | Admitting: Internal Medicine

## 2015-05-30 DIAGNOSIS — R413 Other amnesia: Secondary | ICD-10-CM

## 2015-06-27 ENCOUNTER — Encounter: Payer: Self-pay | Admitting: Neurology

## 2015-06-27 ENCOUNTER — Ambulatory Visit (INDEPENDENT_AMBULATORY_CARE_PROVIDER_SITE_OTHER): Payer: BLUE CROSS/BLUE SHIELD | Admitting: Neurology

## 2015-06-27 VITALS — BP 106/72 | HR 77 | Ht 69.0 in | Wt 157.0 lb

## 2015-06-27 DIAGNOSIS — F411 Generalized anxiety disorder: Secondary | ICD-10-CM | POA: Diagnosis not present

## 2015-06-27 DIAGNOSIS — R519 Headache, unspecified: Secondary | ICD-10-CM | POA: Insufficient documentation

## 2015-06-27 DIAGNOSIS — R413 Other amnesia: Secondary | ICD-10-CM | POA: Diagnosis not present

## 2015-06-27 DIAGNOSIS — R51 Headache: Secondary | ICD-10-CM | POA: Diagnosis not present

## 2015-06-27 NOTE — Patient Instructions (Addendum)
1. Reduce intake of Excedrin or any other over the counter medication to 2-3 a week to avoid rebound headaches. Keep a calendar of your headaches, if still frequent, call our office in 2 months and we will plan to start a daily headache preventative medication (nortriptyline). 2. Physical exercise and brain stimulation exercises, stress management techniques, are important for brain health 3. Follow-up in 7 months, call for any changes

## 2015-06-27 NOTE — Progress Notes (Signed)
NEUROLOGY CONSULTATION NOTE  Pamela Welch MRN: [id number] DOB: 1965/10/22  Referring provider: Dr. Ventura Bruns Primary care provider: Dr. Ventura Bruns  Reason for consult:  Memory loss  Dear Dr Melford Aase:  Thank you for your kind referral of Pamela Welch for consultation of the above symptoms. Although her history is well known to you, please allow me to reiterate it for the purpose of our medical record. Records and images were personally reviewed where available.  HISTORY OF PRESENT ILLNESS: This is a pleasant 50 year old right-handed woman with a history of hyperlipidemia, Barrett's esophagus, anxiety, depression, presenting for evaluation of worsening memory. She started noticing changes a year ago, but feels that this has worsened in the past 6 months. She used to feel she had really good short-term memory, but now forgets names, or tries to remember the name of a movie at the tip of her tongue. 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 Looking back at her notes would help jog her memory. If she does not take good notes, she would not remember some things. She lives alone, but her children have told her that she repeats herself. She misplaces things sometimes. She denies getting lost driving, no missed bills or medications. She denies any difficulties multitasking (but does limit this). Her father started having memory issues in his 71s. She has had 1 or 2 small concussions from falls, no prolonged/significant head injury. She rarely drinks alcohol.  She started having headaches in her 30s, but these have worsened in the past 6 months, she has been having them on a daily basis, lasting a couple of hours if she does not take medication. She has been taking Excedrin migraine daily for the past 6 months. She has pressure-like pain on the temples or back of  her neck, some photosensitivity, no nausea/vomiting, visual obscurations. She denies any dizziness, diplopia, dysarthria, anosmia, tremors, focal numbness/tingling/weakness, bowel/bladder changes. She has occasional swallowing difficulties from the Barrett's esophagus, and chronic neck and back pain since a fall in her 40s. She endorses stress with her children going off to college, as well as stress at work. She would have sleep difficulties if she does not take combination Trazodone '150mg'$  qhs and lorazepam every night (helps with anxiety and calming her down).   I personally reviewed head CT without contrast which was normal. TSH and B12 normal. Laboratory Data: Lab Results  Component Value Date   WBC 6.1 05/16/2015   HGB 12.7 05/16/2015   HCT 38.1 05/16/2015   MCV 88.6 05/16/2015   PLT 284 05/16/2015     Chemistry      Component Value Date/Time   NA 138 05/16/2015 1629   K 4.3 05/16/2015 1629   CL 102 05/16/2015 1629   CO2 25 05/16/2015 1629   BUN 11 05/16/2015 1629   CREATININE 1.02 05/16/2015 1629   CREATININE 1.3* 03/09/2014 0947      Component Value Date/Time   CALCIUM 8.6 05/16/2015 1629   ALKPHOS 42 03/31/2015 0948   AST 18 03/31/2015 0948   ALT 11 03/31/2015 0948   BILITOT 0.3 03/31/2015 0948     Lab Results  Component Value Date   TSH 2.289 03/31/2015   Lab Results  Component Value Date   VITAMINB12 540 05/16/2015     PAST MEDICAL HISTORY: Past Medical History  Diagnosis Date  .  Chest pain, unspecified     normal myoview 2000;  echo 5/10:  mild LVH, EF 60-65%  . Palpitations     h/o symptomatic PVCs  . Restless legs syndrome (RLS)   . Persistent disorder of initiating or maintaining sleep   . Anxiety   . Depression   . Hyperlipidemia   . Back injury   . Barrett's esophagus   . Lumbago   . Insomnia   . HSV-1 (herpes simplex virus 1) infection   . DDD (degenerative disc disease)   . Palpitations   . Melanosis coli   . Hiatal hernia   . IBS  (irritable bowel syndrome)   . GERD (gastroesophageal reflux disease)   . Allergy     SEASONAL    PAST SURGICAL HISTORY: Past Surgical History  Procedure Laterality Date  . Tubes in ears      as an adult  . Refractive surgery      bilateral eyes   . Eye surgery  2001  . Colonoscopy  2013  . Esophagogastroduodenoscopy N/A 03/04/2014    Procedure: ESOPHAGOGASTRODUODENOSCOPY (EGD);  Surgeon: Ladene Artist, MD;  Location: [facility];  Service: Endoscopy;  Laterality: N/A;    MEDICATIONS: Current Outpatient Prescriptions on File Prior to Visit  Medication Sig Dispense Refill  . buPROPion (WELLBUTRIN XL) 300 MG 24 hr tablet TAKE 1 TABLET BY MOUTH ONCE EVERY MORNING FOR MOOD 90 tablet 3  . CRESTOR 20 MG tablet TAKE 1 TABLET BY MOUTH EVERY DAY FOR CHOLESTEROL 90 tablet 1  . famotidine (PEPCID) 40 MG tablet TAKE 1 TABLET BY MOUTH EVERY DAY AT BEDTIME 30 tablet 3  . Linaclotide (LINZESS) 145 MCG CAPS capsule Take 1 capsule (145 mcg total) by mouth daily. 90 capsule 5  . LORazepam (ATIVAN) 2 MG tablet Take 1/2 to 1 tablet at bedtime if needed for sleep 90 tablet 1  . montelukast (SINGULAIR) 10 MG tablet TAKE 1 TABLET EVERY DAY 30 tablet 11  . pantoprazole (PROTONIX) 40 MG tablet TAKE 1 TABLET BY MOUTH TWICE A DAY 60 tablet 3  . traZODone (DESYREL) 150 MG tablet TAKE 1 TO 2 TABLET BY MOUTH AT BEDTIME AS NEEDED FOR SLEEP 60 tablet 2  . fluticasone (FLONASE) 50 MCG/ACT nasal spray Place 2 sprays into both nostrils as needed. Reported on 06/27/2015    . hydrocortisone-pramoxine (ANALPRAM HC) 2.5-1 % rectal cream Place 1 application rectally 3 (three) times daily. (Patient not taking: Reported on 06/27/2015) 30 g 2  . ipratropium (ATROVENT) 0.06 % nasal spray Place 2 sprays into both nostrils 2 (two) times daily as needed. Reported on 06/27/2015    . meloxicam (MOBIC) 15 MG tablet Take 1/2 to 1 tablet daily with food for pain & inflammation (Patient not taking: Reported on 06/27/2015) 90 tablet 99    No current facility-administered medications on file prior to visit.    ALLERGIES: Allergies  Allergen Reactions  . Atenolol Other (See Comments)    Made patient feel like a "zombie"  . Ambien [Zolpidem Tartrate]     Memory issues  . Cholestatin     unknown  . Mirapex [Pramipexole Dihydrochloride]     Hot flashes  . Requip [Ropinirole Hcl]     Hot flashes    FAMILY HISTORY: Family History  Problem Relation Age of Onset  . Hypertension Mother   . Osteoporosis Mother   . Breast cancer Sister 25    BRCA NEG/BILAT MASTECTOMY/CHEMO X 1 YR  . Hyperlipidemia Sister   . Ovarian  cancer Sister   . Colon cancer Maternal Grandmother   . Stroke Maternal Grandmother   . Osteoporosis Maternal Grandmother   . Colon cancer Maternal Grandfather   . Multiple myeloma Paternal Grandmother   . Hypertension Father   . Hyperlipidemia Father   . Heart disease Cousin   . Lung cancer Cousin     SOCIAL HISTORY: Social History   Social History  . Marital Status: Single    Spouse Name: N/A  . Number of Children: 2  . Years of Education: N/A   Occupational History  . cpa    Social History Main Topics  . Smoking status: Never Smoker   . Smokeless tobacco: Never Used  . Alcohol Use: 0.0 oz/week    0 Standard drinks or equivalent per week     Comment: Social  . Drug Use: No  . Sexual Activity: Yes    Birth Control/ Protection: None     Comment: N/A  AGE OF INTERCOURSE QUESTIONS, SEXUAL PARTNERS MORE THAN 5 PT IS A LESBIAN   Other Topics Concern  . Not on file   Social History Narrative   Single.  Lives alone.  2 teenage children.    REVIEW OF SYSTEMS: Constitutional: No fevers, chills, or sweats, no generalized fatigue, change in appetite Eyes: No visual changes, double vision, eye pain Ear, nose and throat: No hearing loss, ear pain, nasal congestion, sore throat Cardiovascular: No chest pain, palpitations Respiratory:  No shortness of breath at rest or with exertion,  wheezes GastrointestinaI: No nausea, vomiting, diarrhea, abdominal pain, fecal incontinence Genitourinary:  No dysuria, urinary retention or frequency Musculoskeletal:  + neck pain, back pain Integumentary: No rash, pruritus, skin lesions Neurological: as above Psychiatric: No depression, insomnia, anxiety Endocrine: No palpitations, fatigue, diaphoresis, mood swings, change in appetite, change in weight, increased thirst Hematologic/Lymphatic:  No anemia, purpura, petechiae. Allergic/Immunologic: no itchy/runny eyes, nasal congestion, recent allergic reactions, rashes  PHYSICAL EXAM: Filed Vitals:   06/27/15 0853  BP: 106/72  Pulse: 77   General: No acute distress Head:  Normocephalic/atraumatic Eyes: Fundoscopic exam shows bilateral sharp discs, no vessel changes, exudates, or hemorrhages Neck: supple, no paraspinal tenderness, full range of motion Back: No paraspinal tenderness Heart: regular rate and rhythm Lungs: Clear to auscultation bilaterally. Vascular: No carotid bruits. Skin/Extremities: No rash, no edema Neurological Exam: Mental status: alert and oriented to person, place, and time, no dysarthria or aphasia, Fund of knowledge is appropriate.  Recent and remote memory are intact.  Attention and concentration are normal.    Able to name objects and repeat phrases.  Montreal Cognitive Assessment  06/27/2015  Visuospatial/ Executive (0/5) 5  Naming (0/3) 3  Attention: Read list of digits (0/2) 2  Attention: Read list of letters (0/1) 1  Attention: Serial 7 subtraction starting at 100 (0/3) 3  Language: Repeat phrase (0/2) 2  Language : Fluency (0/1) 1  Abstraction (0/2) 2  Delayed Recall (0/5) 2  Orientation (0/6) 6  Total 27  Adjusted Score (based on education) 27   Cranial nerves: CN I: not tested CN II: pupils equal, round and reactive to light, visual fields intact, fundi unremarkable. CN III, IV, VI:  full range of motion, no nystagmus, no ptosis CN V:  facial sensation intact CN VII: upper and lower face symmetric CN VIII: hearing intact to finger rub CN IX, X: gag intact, uvula midline CN XI: sternocleidomastoid and trapezius muscles intact CN XII: tongue midline Bulk & Tone: normal, no fasciculations. Motor: 5/5 throughout  with no pronator drift. Sensation: intact to light touch, cold, pin, vibration and joint position sense.  No extinction to double simultaneous stimulation.  Romberg test negative Deep Tendon Reflexes: +2 throughout, no ankle clonus Plantar responses: downgoing bilaterally Cerebellar: no incoordination on finger to nose, heel to shin. No dysdiadochokinesia Gait: narrow-based and steady, able to tandem walk adequately. Tremor: none  IMPRESSION: This is a pleasant 50 year old right-handed woman with a history of hyperlipidemia, anxiety, depression, presenting for worsening memory and daily headaches for the past 6 months. Her neurological exam is normal, MOCA score 27/30 (normal >26/30). We discussed different causes of memory loss. TSH and B12 normal, head CT normal. We also discussed effects of mood and stress on memory, she does endorse stress at work and with her children going off to college. I reassured patient of normal exam, we will repeat MOCA testing in 8-12 months. We discussed the importance of control of vascular risk factors, physical exercise, and brain stimulation exercises for brain health. We also discussed the daily headaches, likely tension-type headaches worsened by medication overuse with daily Excedrin intake. We discussed the option of starting a daily headache preventative medication such as nortriptyline, as well as minimizing intake of rescue medications to 2-3 times a week to avoid rebound headaches. She would like to try reducing Excedrin intake first and monitor headache response, if still frequent, she will call our office in 2 months and we will plan to start low dose nortriptyline 72m qhs with  uptitration as tolerated. Side effects were discussed. Recommend she reduce dose of Trazodone or lorazepam if we do start nortriptyline, she feels lorazepam helps most with her anxiety and would like to taper down Trazodone instead. She will follow-up in 7-8 months and knows to call for any changes.  Thank you for allowing me to participate in the care of this patient. Please do not hesitate to call for any questions or concerns.   KEllouise Newer M.D.  CC: Dr. WUnk Pinto

## 2015-06-30 ENCOUNTER — Ambulatory Visit (INDEPENDENT_AMBULATORY_CARE_PROVIDER_SITE_OTHER): Payer: BLUE CROSS/BLUE SHIELD | Admitting: Internal Medicine

## 2015-06-30 ENCOUNTER — Encounter: Payer: Self-pay | Admitting: Internal Medicine

## 2015-06-30 VITALS — BP 102/66 | HR 76 | Temp 97.5°F | Resp 16 | Ht 69.0 in | Wt 156.4 lb

## 2015-06-30 DIAGNOSIS — G4733 Obstructive sleep apnea (adult) (pediatric): Secondary | ICD-10-CM

## 2015-06-30 DIAGNOSIS — R030 Elevated blood-pressure reading, without diagnosis of hypertension: Secondary | ICD-10-CM

## 2015-06-30 DIAGNOSIS — K589 Irritable bowel syndrome without diarrhea: Secondary | ICD-10-CM

## 2015-06-30 DIAGNOSIS — E785 Hyperlipidemia, unspecified: Secondary | ICD-10-CM

## 2015-06-30 DIAGNOSIS — R5383 Other fatigue: Secondary | ICD-10-CM

## 2015-06-30 DIAGNOSIS — N183 Chronic kidney disease, stage 3 unspecified: Secondary | ICD-10-CM | POA: Insufficient documentation

## 2015-06-30 DIAGNOSIS — G8929 Other chronic pain: Secondary | ICD-10-CM

## 2015-06-30 DIAGNOSIS — M549 Dorsalgia, unspecified: Secondary | ICD-10-CM

## 2015-06-30 DIAGNOSIS — Z0000 Encounter for general adult medical examination without abnormal findings: Secondary | ICD-10-CM | POA: Diagnosis not present

## 2015-06-30 DIAGNOSIS — Z0001 Encounter for general adult medical examination with abnormal findings: Secondary | ICD-10-CM

## 2015-06-30 DIAGNOSIS — IMO0001 Reserved for inherently not codable concepts without codable children: Secondary | ICD-10-CM

## 2015-06-30 DIAGNOSIS — E559 Vitamin D deficiency, unspecified: Secondary | ICD-10-CM | POA: Diagnosis not present

## 2015-06-30 DIAGNOSIS — Z1212 Encounter for screening for malignant neoplasm of rectum: Secondary | ICD-10-CM

## 2015-06-30 DIAGNOSIS — Z111 Encounter for screening for respiratory tuberculosis: Secondary | ICD-10-CM

## 2015-06-30 DIAGNOSIS — R7309 Other abnormal glucose: Secondary | ICD-10-CM

## 2015-06-30 DIAGNOSIS — Z79899 Other long term (current) drug therapy: Secondary | ICD-10-CM | POA: Diagnosis not present

## 2015-06-30 DIAGNOSIS — N182 Chronic kidney disease, stage 2 (mild): Secondary | ICD-10-CM

## 2015-06-30 DIAGNOSIS — K219 Gastro-esophageal reflux disease without esophagitis: Secondary | ICD-10-CM

## 2015-06-30 LAB — CBC WITH DIFFERENTIAL/PLATELET
BASOS ABS: 0.1 10*3/uL (ref 0.0–0.1)
Basophils Relative: 1 % (ref 0–1)
EOS ABS: 0.1 10*3/uL (ref 0.0–0.7)
EOS PCT: 2 % (ref 0–5)
HEMATOCRIT: 40.7 % (ref 36.0–46.0)
Hemoglobin: 13.6 g/dL (ref 12.0–15.0)
Lymphocytes Relative: 28 % (ref 12–46)
Lymphs Abs: 1.5 10*3/uL (ref 0.7–4.0)
MCH: 29.8 pg (ref 26.0–34.0)
MCHC: 33.4 g/dL (ref 30.0–36.0)
MCV: 89.1 fL (ref 78.0–100.0)
MONO ABS: 0.3 10*3/uL (ref 0.1–1.0)
MPV: 10 fL (ref 8.6–12.4)
Monocytes Relative: 6 % (ref 3–12)
Neutro Abs: 3.3 10*3/uL (ref 1.7–7.7)
Neutrophils Relative %: 63 % (ref 43–77)
PLATELETS: 255 10*3/uL (ref 150–400)
RBC: 4.57 MIL/uL (ref 3.87–5.11)
RDW: 13 % (ref 11.5–15.5)
WBC: 5.2 10*3/uL (ref 4.0–10.5)

## 2015-06-30 LAB — URINALYSIS, MICROSCOPIC ONLY
Bacteria, UA: NONE SEEN [HPF]
CASTS: NONE SEEN [LPF]
RBC / HPF: NONE SEEN RBC/HPF (ref <=2)
Squamous Epithelial / LPF: NONE SEEN [HPF] (ref <=5)
WBC, UA: NONE SEEN WBC/HPF (ref <=5)
YEAST: NONE SEEN [HPF]

## 2015-06-30 LAB — HEPATIC FUNCTION PANEL
ALK PHOS: 44 U/L (ref 33–130)
ALT: 11 U/L (ref 6–29)
AST: 17 U/L (ref 10–35)
Albumin: 4.3 g/dL (ref 3.6–5.1)
BILIRUBIN INDIRECT: 0.4 mg/dL (ref 0.2–1.2)
Bilirubin, Direct: 0.1 mg/dL (ref <=0.2)
TOTAL PROTEIN: 7.1 g/dL (ref 6.1–8.1)
Total Bilirubin: 0.5 mg/dL (ref 0.2–1.2)

## 2015-06-30 LAB — URINALYSIS, ROUTINE W REFLEX MICROSCOPIC
Bilirubin Urine: NEGATIVE
GLUCOSE, UA: NEGATIVE
Hgb urine dipstick: NEGATIVE
Ketones, ur: NEGATIVE
LEUKOCYTES UA: NEGATIVE
NITRITE: NEGATIVE
PH: 5 (ref 5.0–8.0)
Protein, ur: NEGATIVE
SPECIFIC GRAVITY, URINE: 1.025 (ref 1.001–1.035)

## 2015-06-30 LAB — BASIC METABOLIC PANEL WITH GFR
BUN: 13 mg/dL (ref 7–25)
CO2: 26 mmol/L (ref 20–31)
Calcium: 9.4 mg/dL (ref 8.6–10.4)
Chloride: 104 mmol/L (ref 98–110)
Creat: 1.16 mg/dL — ABNORMAL HIGH (ref 0.50–1.05)
GFR, EST NON AFRICAN AMERICAN: 55 mL/min — AB (ref >=60)
GFR, Est African American: 63 mL/min (ref >=60)
GLUCOSE: 77 mg/dL (ref 65–99)
POTASSIUM: 4.4 mmol/L (ref 3.5–5.3)
Sodium: 140 mmol/L (ref 135–146)

## 2015-06-30 LAB — LIPID PANEL
Cholesterol: 149 mg/dL (ref 125–200)
HDL: 75 mg/dL (ref >=46)
LDL CALC: 57 mg/dL (ref <130)
Total CHOL/HDL Ratio: 2 Ratio (ref <=5.0)
Triglycerides: 86 mg/dL (ref <150)
VLDL: 17 mg/dL (ref <30)

## 2015-06-30 LAB — MAGNESIUM: Magnesium: 1.8 mg/dL (ref 1.5–2.5)

## 2015-06-30 LAB — IRON AND TIBC
%SAT: 25 % (ref 11–50)
IRON: 90 ug/dL (ref 45–160)
TIBC: 360 ug/dL (ref 250–450)
UIBC: 270 ug/dL (ref 125–400)

## 2015-06-30 MED ORDER — CYCLOBENZAPRINE HCL 10 MG PO TABS: ORAL_TABLET | ORAL | Status: DC

## 2015-06-30 NOTE — Progress Notes (Signed)
Patient ID: Pamela Welch, female   DOB: 10-04-1965, 50 y.o.   MRN: [id number]  Annual Screening/Preventative Visit And Comprehensive Evaluation &  Examination  This very nice 50 y.o. SWF presents for presents for a Wellness/Preventative Visit & comprehensive evaluation and management of multiple medical co-morbidities. Patient did have a sleep study in 2009 showing OSA. Patient also has Lumbar DDD and has hx/o intermittent LBP w/o sciatica. Other problems include IBS-C and she's has a great response to Linzess.     Patient had a transiently elevated BP 2004 and has since been monitored expectantly for HTN. Patient's BP has been controlled at home and patient denies any cardiac symptoms as chest pain, palpitations, shortness of breath, dizziness or ankle swelling. Today's BP: 102/66 mmHg    Patient's hyperlipidemia is controlled with diet. Last lipids were at goal with Cholesterol 157; HDL 70; LDL 67; Triglycerides 99 on 03/31/2015.   Patient has hx/o elevated glucose in the past and is screened expectantly for prediabetes and patient denies reactive hypoglycemic symptoms, visual blurring, diabetic polys, or paresthesias. Last A1c was  5.5% on 12/22/2014.   Finally, patient has history of Vitamin D Deficiency of "84" in 2012 and last Vitamin D was 48 on 03/31/2015.    Medication Sig  . buPROPion- XL 300 MG 24 hr tablet TAKE 1 TABLET BY MOUTH ONCE EVERY MORNING FOR MOOD  . CRESTOR 20 MG tablet TAKE 1 TABLET BY MOUTH EVERY DAY FOR CHOLESTEROL  . famotidine  40 MG tablet TAKE 1 TABLET BY MOUTH EVERY DAY AT BEDTIME  . FLONASE nasal spray Place 2 sprays into both nostrils as needed. Reported on 06/27/2015  . ANALPRAM HC 2.5-1 % rectal cream Place 1 application rectally 3 (three) times daily.  . ATROVENT  nasal spray Place 2 sprays into both nostrils 2 (two) times daily as needed. Reported on 06/27/2015  . LINZESS 145 MCG Take 1 capsule (145 mcg total) by mouth daily.  Marland Kitchen LORazepam  2 MG  Take 1/2 to 1 tablet  at bedtime if needed for sleep  . meloxicam  15 MG tablet Take 1/2 to 1 tablet daily with food for pain & inflammation  . montelukast 10 MG tablet TAKE 1 TABLET EVERY DAY  . pantoprazole  40 MG tablet TAKE 1 TABLET BY MOUTH TWICE A DAY  . traZODone150 MG tablet TAKE 1 TO 2 TABLET BY MOUTH AT BEDTIME AS NEEDED FOR SLEEP    Allergies  Allergen Reactions  . Atenolol Other (See Comments)    Made patient feel like a "zombie"  . Ambien [Zolpidem Tartrate]     Memory issues  . Cholestatin     unknown  . Mirapex [Pramipexole Dihydrochloride]     Hot flashes  . Requip [Ropinirole Hcl]     Hot flashes   Past Medical History  Diagnosis Date  . Chest pain, unspecified     normal myoview 2000;  echo 5/10:  mild LVH, EF 60-65%  . Palpitations     h/o symptomatic PVCs  . Restless legs syndrome (RLS)   . Persistent disorder of initiating or maintaining sleep   . Anxiety   . Depression   . Hyperlipidemia   . Back injury   . Barrett's esophagus   . Lumbago   . Insomnia   . HSV-1 (herpes simplex virus 1) infection   . DDD (degenerative disc disease)   . Palpitations   . Melanosis coli   . Hiatal hernia   . IBS (irritable bowel syndrome)   .  GERD (gastroesophageal reflux disease)   . Allergy     SEASONAL   Health Maintenance  Topic Date Due  . TETANUS/TDAP  05/28/2011  . INFLUENZA VACCINE  12/26/2014  . MAMMOGRAM  06/21/2016  . PAP SMEAR  09/14/2017  . COLONOSCOPY  04/15/2024  . HIV Screening  Completed   Immunization History  Administered Date(s) Administered  . DTaP 04/08/2012  . Influenza Split 04/15/2013, 04/27/2014  . Influenza-Unspecified 03/27/2012  . PPD Test 04/15/2013, 04/27/2014, 06/30/2015  . Pneumococcal-Unspecified 05/27/2009  . Td 05/27/2001   Past Surgical History  Procedure Laterality Date  . Tubes in ears      as an adult  . Refractive surgery      bilateral eyes   . Eye surgery  2001  . Colonoscopy  2013  . Esophagogastroduodenoscopy N/A 03/04/2014     Procedure: ESOPHAGOGASTRODUODENOSCOPY (EGD);  Surgeon: Ladene Artist, MD;  Location: [facility];  Service: Endoscopy;  Laterality: N/A;   Family History  Problem Relation Age of Onset  . Hypertension Mother   . Osteoporosis Mother   . Breast cancer Sister 8    BRCA NEG/BILAT MASTECTOMY/CHEMO X 1 YR  . Hyperlipidemia Sister   . Ovarian cancer Sister   . Colon cancer Maternal Grandmother   . Stroke Maternal Grandmother   . Osteoporosis Maternal Grandmother   . Colon cancer Maternal Grandfather   . Multiple myeloma Paternal Grandmother   . Hypertension Father   . Hyperlipidemia Father   . Heart disease Cousin   . Lung cancer Cousin    Social History  Substance Use Topics  . Smoking status: Never Smoker   . Smokeless tobacco: Never Used  . Alcohol Use: 0.0 oz/week    0 Standard drinks or equivalent per week     Comment: Social    ROS Constitutional: Denies fever, chills, weight loss/gain, headaches, insomnia,  night sweats, and change in appetite. Does c/o fatigue. Eyes: Denies redness, blurred vision, diplopia, discharge, itchy, watery eyes.  ENT: Denies discharge, congestion, post nasal drip, epistaxis, sore throat, earache, hearing loss, dental pain, Tinnitus, Vertigo, Sinus pain, snoring.  Cardio: Denies chest pain, palpitations, irregular heartbeat, syncope, dyspnea, diaphoresis, orthopnea, PND, claudication, edema Respiratory: denies cough, dyspnea, DOE, pleurisy, hoarseness, laryngitis, wheezing.  Gastrointestinal: Denies dysphagia, heartburn, reflux, water brash, pain, cramps, nausea, vomiting, bloating, diarrhea, constipation, hematemesis, melena, hematochezia, jaundice, hemorrhoids Genitourinary: Denies dysuria, frequency, urgency, nocturia, hesitancy, discharge, hematuria, flank pain Breast: Breast lumps, nipple discharge, bleeding.  Musculoskeletal: Denies arthralgia, myalgia, stiffness, Jt. Swelling, pain, limp, and strain/sprain. Denies falls. Skin: Denies  puritis, rash, hives, warts, acne, eczema, changing in skin lesion Neuro: No weakness, tremor, incoordination, spasms, paresthesia, pain Psychiatric: Denies confusion, memory loss, sensory loss. Denies Depression. Endocrine: Denies change in weight, skin, hair change, nocturia, and paresthesia, diabetic polys, visual blurring, hyper / hypo glycemic episodes.  Heme/Lymph: No excessive bleeding, bruising, enlarged lymph nodes.  Physical Exam  BP 102/66 mmHg  Pulse 76  Temp(Src) 97.5 F (36.4 C)  Resp 16  Ht 5' 9"  (1.753 m)  Wt 156 lb 6.4 oz (70.943 kg)  BMI 23.09 kg/m2  LMP 06/23/2015 (Approximate)  General Appearance: Well nourished and in no apparent distress. Eyes: PERRLA, EOMs, conjunctiva no swelling or erythema, normal fundi and vessels. Sinuses: No frontal/maxillary tenderness ENT/Mouth: EACs patent / TMs  nl. Nares clear without erythema, swelling, mucoid exudates. Oral hygiene is good. No erythema, swelling, or exudate. Tongue normal, non-obstructing. Tonsils not swollen or erythematous. Hearing normal.  Neck: Supple, thyroid normal.  No bruits, nodes or JVD. Respiratory: Respiratory effort normal.  BS equal and clear bilateral without rales, rhonci, wheezing or stridor. Cardio: Heart sounds are normal with regular rate and rhythm and no murmurs, rubs or gallops. Peripheral pulses are normal and equal bilaterally without edema. No aortic or femoral bruits. Chest: symmetric with normal excursions and percussion. Breasts: Symmetric, without lumps, nipple discharge, retractions, or fibrocystic changes.  Abdomen: Flat, soft, with bowl sounds. Nontender, no guarding, rebound, hernias, masses, or organomegaly.  Lymphatics: Non tender without lymphadenopathy.  Genitourinary:  Musculoskeletal: Full ROM all peripheral extremities, joint stability, 5/5 strength, and normal gait. Skin: Warm and dry without rashes, lesions, cyanosis, clubbing or  ecchymosis.  Neuro: Cranial nerves intact,  reflexes equal bilaterally. Normal muscle tone, no cerebellar symptoms. Sensation intact.  Pysch: Alert and oriented X 3, normal affect, Insight and Judgment appropriate.   Assessment and Plan  1. Annual Preventative Screening Examination  - PPD - Microalbumin / creatinine urine ratio - EKG 12-Lead - [place], RETROPERITNL ABD,  LTD - POC Hemoccult Bld/Stl  - Urinalysis, Routine w reflex microscopic - Vitamin B12 - Iron and TIBC - CBC with Differential/Platelet - BASIC METABOLIC PANEL WITH GFR - Hepatic function panel - Magnesium - Lipid panel - TSH - Hemoglobin A1c - Insulin, random - VITAMIN D 25 Hydroxy   2. Labile HTN  - TSH  3. Hyperlipidemia  - Lipid panel - TSH  4. Abnormal glucose  - Hemoglobin A1c - Insulin, random  5. Gastroesophageal reflux disease  - VITAMIN D 25 Hydroxy   6. IBS (irritable bowel syndrome)   7. OSA (obstructive sleep apnea)   8. Screening for rectal cancer  - POC Hemoccult Bld/Stl   9. Other fatigue  - Vitamin B12 - Iron and TIBC - CBC with Differential/Platelet - TSH  10. Medication management  - Urinalysis, Routine w reflex microscopic - CBC with Differential/Platelet - BASIC METABOLIC PANEL WITH GFR - Hepatic function panel - Magnesium   Continue prudent diet as discussed, weight control, BP monitoring, regular exercise, and medications. Discussed med's effects and SE's. Screening labs and tests as requested with regular follow-up as recommended. Over 40 minutes of exam, counseling, chart review and high complex critical decision making was performed.

## 2015-06-30 NOTE — Patient Instructions (Signed)
Recommend Adult Low Dose Aspirin or   coated  Aspirin 81 mg daily   To reduce risk of Colon Cancer 20 %,   Skin Cancer 26 % ,   Melanoma 46%   and   Pancreatic cancer 60%   ++++++++++++++++++++++++++++++++++++++++++++++++++++++ Vitamin D goal   is between 70-100.   Please make sure that you are taking your Vitamin D as directed.   It is very important as a natural anti-inflammatory   helping hair, skin, and nails, as well as reducing stroke and heart attack risk.   It helps your bones and helps with mood.  It also decreases numerous cancer risks so please take it as directed.   Low Vit D is associated with a 200-300% higher risk for CANCER   and 200-300% higher risk for HEART   ATTACK  &  STROKE.   ......................................  It is also associated with higher death rate at younger ages,   autoimmune diseases like Rheumatoid arthritis, Lupus, Multiple Sclerosis.     Also many other serious conditions, like depression, Alzheimer's  Dementia, infertility, muscle aches, fatigue, fibromyalgia - just to name a few.  ++++++++++++++++++++++++++++++++++++++++++++++++  Recommend the book "The END of DIETING" by Dr Joel Fuhrman   & the book "The END of DIABETES " by Dr Joel Fuhrman  At [employer] - get book & Audio CD's     Being diabetic has a  300% increased risk for heart attack, stroke, cancer, and alzheimer- type vascular dementia. It is very important that you work harder with diet by avoiding all foods that are white. Avoid white rice (brown & wild rice is OK), white potatoes (sweetpotatoes in moderation is OK), White bread or wheat bread or anything made out of white flour like bagels, donuts, rolls, buns, biscuits, cakes, pastries, cookies, pizza crust, and pasta (made from white flour & egg whites) - vegetarian pasta or spinach or wheat pasta is OK. Multigrain breads like Arnold's or Pepperidge [employer], or multigrain sandwich thins or flatbreads.  Diet,  exercise and weight loss can reverse and cure diabetes in the early stages.  Diet, exercise and weight loss is very important in the control and prevention of complications of diabetes which affects every system in your body, ie. Brain - dementia/stroke, eyes - glaucoma/blindness, heart - heart attack/heart failure, kidneys - dialysis, stomach - gastric paralysis, intestines - malabsorption, nerves - severe painful neuritis, circulation - gangrene & loss of a leg(s), and finally cancer and Alzheimers.    I recommend avoid fried & greasy foods,  sweets/candy, white rice (brown or wild rice or Quinoa is OK), white potatoes (sweet potatoes are OK) - anything made from white flour - bagels, doughnuts, rolls, buns, biscuits,white and wheat breads, pizza crust and traditional pasta made of white flour & egg white(vegetarian pasta or spinach or wheat pasta is OK).  Multi-grain bread is OK - like multi-grain flat bread or sandwich thins. Avoid alcohol in excess. Exercise is also important.    Eat all the vegetables you want - avoid meat, especially red meat and dairy - especially cheese.  Cheese is the most concentrated form of trans-fats which is the worst thing to clog up our arteries. Veggie cheese is OK which can be found in the fresh produce section at [employer] or [employer] or [employer]  ++++++++++++++++++++++++++++++++++++++++++++++++++ DASH Eating Plan  DASH stands for "Dietary Approaches to Stop Hypertension."   The DASH eating plan is a healthy eating plan that has been shown to reduce high blood   pressure (hypertension). Additional health benefits may include reducing the risk of type 2 diabetes mellitus, heart disease, and stroke. The DASH eating plan may also help with weight loss.  WHAT DO I NEED TO KNOW ABOUT THE DASH EATING PLAN?  For the DASH eating plan, you will follow these general guidelines:  Choose foods with a percent daily value for sodium of less than 5% (as listed on the food  label).  Use salt-free seasonings or herbs instead of table salt or sea salt.  Check with your health care provider or pharmacist before using salt substitutes.  Eat lower-sodium products, often labeled as "lower sodium" or "no salt added."  Eat fresh foods.  Eat more vegetables, fruits, and low-fat dairy products.    Choose whole grains. Look for the word "whole" as the first word in the ingredient list.  Choose fish   Limit sweets, desserts, sugars, and sugary drinks.  Choose heart-healthy fats.  Eat veggie cheese   Eat more home-cooked food and less restaurant, buffet, and fast food.  Limit fried foods.  Cook foods using methods other than frying.  Limit canned vegetables. If you do use them, rinse them well to decrease the sodium.  When eating at a restaurant, ask that your food be prepared with less salt, or no salt if possible.                      WHAT FOODS CAN I EAT?  Read Dr Joel Fuhrman's books on The End of Dieting & The End of Diabetes  Grains  Whole grain or whole wheat bread. Brown rice. Whole grain or whole wheat pasta. Quinoa, bulgur, and whole grain cereals. Low-sodium cereals. Corn or whole wheat flour tortillas. Whole grain cornbread. Whole grain crackers. Low-sodium crackers.  Vegetables  Fresh or frozen vegetables (raw, steamed, roasted, or grilled). Low-sodium or reduced-sodium tomato and vegetable juices. Low-sodium or reduced-sodium tomato sauce and paste. Low-sodium or reduced-sodium canned vegetables.   Fruits  All fresh, canned (in natural juice), or frozen fruits.  Protein Products   All fish and seafood.  Dried beans, peas, or lentils. Unsalted nuts and seeds. Unsalted canned beans.  Dairy  Low-fat dairy products, such as skim or 1% milk, 2% or reduced-fat cheeses, low-fat ricotta or cottage cheese, or plain low-fat yogurt. Low-sodium or reduced-sodium cheeses.  Fats and Oils  Tub margarines without trans fats. Light or  reduced-fat mayonnaise and salad dressings (reduced sodium). Avocado. Safflower, olive, or canola oils. Natural peanut or almond butter.  Other  Unsalted popcorn and pretzels. The items listed above may not be a complete list of recommended foods or beverages. Contact your dietitian for more options.  +++++++++++++++++++++++++++++++++++++++++++  WHAT FOODS ARE NOT RECOMMENDED?  Grains/ White flour or wheat flour  White bread. White pasta. White rice. Refined cornbread. Bagels and croissants. Crackers that contain trans fat.  Vegetables  Creamed or fried vegetables. Vegetables in a . Regular canned vegetables. Regular canned tomato sauce and paste. Regular tomato and vegetable juices.  Fruits  Dried fruits. Canned fruit in light or heavy syrup. Fruit juice.  Meat and Other Protein Products  Meat in general - RED mwaet & White meat.  Fatty cuts of meat. Ribs, chicken wings, bacon, sausage, bologna, salami, chitterlings, fatback, hot dogs, bratwurst, and packaged luncheon meats.  Dairy  Whole or 2% milk, cream, half-and-half, and cream cheese. Whole-fat or sweetened yogurt. Full-fat cheeses or blue cheese. Nondairy creamers and whipped toppings. Processed cheese, cheese spreads, or   cheese curds.  Condiments  Onion and garlic salt, seasoned salt, table salt, and sea salt. Canned and packaged gravies. Worcestershire sauce. Tartar sauce. Barbecue sauce. Teriyaki sauce. Soy sauce, including reduced sodium. Steak sauce. Fish sauce. Oyster sauce. Cocktail sauce. Horseradish. Ketchup and mustard. Meat flavorings and tenderizers. Bouillon cubes. Hot sauce. Tabasco sauce. Marinades. Taco seasonings. Relishes.  Fats and Oils Butter, stick margarine, lard, shortening and bacon fat. Coconut, palm kernel, or palm oils. Regular salad dressings.  Pickles and olives. Salted popcorn and pretzels.  The items listed above may not be a complete list of foods and beverages to avoid.  .prevent1

## 2015-07-01 ENCOUNTER — Other Ambulatory Visit: Payer: Self-pay | Admitting: Internal Medicine

## 2015-07-01 DIAGNOSIS — N183 Chronic kidney disease, stage 3 (moderate): Secondary | ICD-10-CM

## 2015-07-01 LAB — MICROALBUMIN / CREATININE URINE RATIO
Creatinine, Urine: 292 mg/dL (ref 20–320)
Microalb Creat Ratio: 4 mcg/mg creat (ref <30)
Microalb, Ur: 1.1 mg/dL

## 2015-07-01 LAB — HEMOGLOBIN A1C
HEMOGLOBIN A1C: 5.5 % (ref <5.7)
MEAN PLASMA GLUCOSE: 111 mg/dL (ref <117)

## 2015-07-01 LAB — VITAMIN D 25 HYDROXY (VIT D DEFICIENCY, FRACTURES): Vit D, 25-Hydroxy: 46 ng/mL (ref 30–100)

## 2015-07-01 LAB — VITAMIN B12: Vitamin B-12: 536 pg/mL (ref 211–911)

## 2015-07-01 LAB — TSH: TSH: 1.667 u[IU]/mL (ref 0.350–4.500)

## 2015-07-03 LAB — INSULIN, RANDOM: Insulin: 4.1 u[IU]/mL (ref 2.0–19.6)

## 2015-07-23 ENCOUNTER — Other Ambulatory Visit: Payer: Self-pay | Admitting: Internal Medicine

## 2015-07-25 ENCOUNTER — Other Ambulatory Visit: Payer: Self-pay | Admitting: Internal Medicine

## 2015-07-25 DIAGNOSIS — G4700 Insomnia, unspecified: Secondary | ICD-10-CM

## 2015-08-20 ENCOUNTER — Other Ambulatory Visit: Payer: Self-pay | Admitting: Physician Assistant

## 2015-08-20 ENCOUNTER — Other Ambulatory Visit: Payer: Self-pay | Admitting: Internal Medicine

## 2015-09-19 ENCOUNTER — Other Ambulatory Visit: Payer: Self-pay | Admitting: Physician Assistant

## 2015-09-20 DIAGNOSIS — Z1231 Encounter for screening mammogram for malignant neoplasm of breast: Secondary | ICD-10-CM | POA: Diagnosis not present

## 2015-09-20 DIAGNOSIS — Z803 Family history of malignant neoplasm of breast: Secondary | ICD-10-CM | POA: Diagnosis not present

## 2015-09-21 DIAGNOSIS — E785 Hyperlipidemia, unspecified: Secondary | ICD-10-CM | POA: Diagnosis not present

## 2015-09-21 DIAGNOSIS — N183 Chronic kidney disease, stage 3 (moderate): Secondary | ICD-10-CM | POA: Diagnosis not present

## 2015-09-21 DIAGNOSIS — M5136 Other intervertebral disc degeneration, lumbar region: Secondary | ICD-10-CM | POA: Diagnosis not present

## 2015-10-18 ENCOUNTER — Ambulatory Visit (INDEPENDENT_AMBULATORY_CARE_PROVIDER_SITE_OTHER): Payer: BLUE CROSS/BLUE SHIELD | Admitting: Internal Medicine

## 2015-10-18 ENCOUNTER — Other Ambulatory Visit: Payer: Self-pay | Admitting: *Deleted

## 2015-10-18 ENCOUNTER — Encounter: Payer: Self-pay | Admitting: Internal Medicine

## 2015-10-18 VITALS — BP 116/80 | HR 76 | Temp 97.8°F | Resp 16 | Ht 69.0 in | Wt 159.0 lb

## 2015-10-18 DIAGNOSIS — J309 Allergic rhinitis, unspecified: Secondary | ICD-10-CM

## 2015-10-18 DIAGNOSIS — E559 Vitamin D deficiency, unspecified: Secondary | ICD-10-CM | POA: Diagnosis not present

## 2015-10-18 DIAGNOSIS — Z79899 Other long term (current) drug therapy: Secondary | ICD-10-CM

## 2015-10-18 DIAGNOSIS — E782 Mixed hyperlipidemia: Secondary | ICD-10-CM

## 2015-10-18 DIAGNOSIS — L709 Acne, unspecified: Secondary | ICD-10-CM

## 2015-10-18 DIAGNOSIS — R7309 Other abnormal glucose: Secondary | ICD-10-CM | POA: Diagnosis not present

## 2015-10-18 DIAGNOSIS — R030 Elevated blood-pressure reading, without diagnosis of hypertension: Secondary | ICD-10-CM

## 2015-10-18 DIAGNOSIS — IMO0001 Reserved for inherently not codable concepts without codable children: Secondary | ICD-10-CM

## 2015-10-18 LAB — BASIC METABOLIC PANEL WITH GFR
BUN: 14 mg/dL (ref 7–25)
CO2: 25 mmol/L (ref 20–31)
Calcium: 9 mg/dL (ref 8.6–10.4)
Chloride: 103 mmol/L (ref 98–110)
Creat: 1.04 mg/dL (ref 0.50–1.05)
GFR, EST AFRICAN AMERICAN: 72 mL/min (ref >=60)
GFR, EST NON AFRICAN AMERICAN: 63 mL/min (ref >=60)
GLUCOSE: 85 mg/dL (ref 65–99)
POTASSIUM: 4.5 mmol/L (ref 3.5–5.3)
SODIUM: 137 mmol/L (ref 135–146)

## 2015-10-18 LAB — CBC WITH DIFFERENTIAL/PLATELET
BASOS PCT: 1 %
Basophils Absolute: 66 cells/uL (ref 0–200)
Eosinophils Absolute: 198 cells/uL (ref 15–500)
Eosinophils Relative: 3 %
HEMATOCRIT: 40.2 % (ref 35.0–45.0)
HEMOGLOBIN: 13.5 g/dL (ref 11.7–15.5)
LYMPHS ABS: 1848 {cells}/uL (ref 850–3900)
Lymphocytes Relative: 28 %
MCH: 30.2 pg (ref 27.0–33.0)
MCHC: 33.6 g/dL (ref 32.0–36.0)
MCV: 89.9 fL (ref 80.0–100.0)
MONO ABS: 396 {cells}/uL (ref 200–950)
MPV: 9.6 fL (ref 7.5–12.5)
Monocytes Relative: 6 %
NEUTROS ABS: 4092 {cells}/uL (ref 1500–7800)
Neutrophils Relative %: 62 %
Platelets: 263 10*3/uL (ref 140–400)
RBC: 4.47 MIL/uL (ref 3.80–5.10)
RDW: 13.6 % (ref 11.0–15.0)
WBC: 6.6 10*3/uL (ref 3.8–10.8)

## 2015-10-18 LAB — HEPATIC FUNCTION PANEL
ALK PHOS: 46 U/L (ref 33–130)
ALT: 11 U/L (ref 6–29)
AST: 15 U/L (ref 10–35)
Albumin: 4.1 g/dL (ref 3.6–5.1)
BILIRUBIN DIRECT: 0.1 mg/dL (ref <=0.2)
BILIRUBIN INDIRECT: 0.4 mg/dL (ref 0.2–1.2)
TOTAL PROTEIN: 6.9 g/dL (ref 6.1–8.1)
Total Bilirubin: 0.5 mg/dL (ref 0.2–1.2)

## 2015-10-18 LAB — HEMOGLOBIN A1C
HEMOGLOBIN A1C: 5.2 % (ref <5.7)
Mean Plasma Glucose: 103 mg/dL

## 2015-10-18 LAB — LIPID PANEL
Cholesterol: 246 mg/dL — ABNORMAL HIGH (ref 125–200)
HDL: 70 mg/dL (ref >=46)
LDL CALC: 159 mg/dL — AB (ref <130)
Total CHOL/HDL Ratio: 3.5 Ratio (ref <=5.0)
Triglycerides: 87 mg/dL (ref <150)
VLDL: 17 mg/dL (ref <30)

## 2015-10-18 LAB — TSH: TSH: 3.28 mIU/L

## 2015-10-18 MED ORDER — VALACYCLOVIR HCL 500 MG PO TABS: 500.0000 mg | ORAL_TABLET | Freq: Every day | ORAL | Status: DC

## 2015-10-18 MED ORDER — LORAZEPAM 2 MG PO TABS: 2.0000 mg | ORAL_TABLET | Freq: Every day | ORAL | Status: DC

## 2015-10-18 MED ORDER — PANTOPRAZOLE SODIUM 40 MG PO TBEC: 40.0000 mg | DELAYED_RELEASE_TABLET | Freq: Two times a day (BID) | ORAL | Status: DC

## 2015-10-18 MED ORDER — TRAZODONE HCL 150 MG PO TABS: ORAL_TABLET | ORAL | Status: DC

## 2015-10-18 MED ORDER — FAMOTIDINE 40 MG PO TABS: 40.0000 mg | ORAL_TABLET | Freq: Every day | ORAL | Status: DC

## 2015-10-18 MED ORDER — ADAPALENE-BENZOYL PEROXIDE 0.1-2.5 % EX GEL: CUTANEOUS | Status: DC

## 2015-10-18 MED ORDER — BUPROPION HCL ER (XL) 300 MG PO TB24: ORAL_TABLET | ORAL | Status: DC

## 2015-10-18 MED ORDER — LINACLOTIDE 145 MCG PO CAPS: 145.0000 ug | ORAL_CAPSULE | Freq: Every day | ORAL | Status: DC

## 2015-10-18 MED ORDER — VALACYCLOVIR HCL 500 MG PO TABS: 500.0000 mg | ORAL_TABLET | Freq: Every day | ORAL | Status: AC

## 2015-10-18 MED ORDER — MONTELUKAST SODIUM 10 MG PO TABS: 10.0000 mg | ORAL_TABLET | Freq: Every day | ORAL | Status: DC

## 2015-10-18 NOTE — Patient Instructions (Addendum)
Please use benzoyl peroxide soap to wash your face.  It does bleach fabrics so use a white pillow case.  Please use epiduo on your skin that is affected by acne.  Little bit goes a long way.  Try tylenol instead of meloxicam.  Use 2 extra strength tablets.    Call GI doctors and ask about tapering off of pantoprazole and staying on just pepcid due to kidney function.

## 2015-10-18 NOTE — Progress Notes (Signed)
Assessment and Plan:  Hypertension:  -Continue medication,  -monitor blood pressure at home.  -Continue DASH diet.   -Reminder to go to the ER if any CP, SOB, nausea, dizziness, severe HA, changes vision/speech, left arm numbness and tingling, and jaw pain.  Cholesterol: -Continue diet and exercise.  -Check cholesterol.   Pre-diabetes: -Continue diet and exercise.  -Check A1C  Vitamin D Def: -check level -continue medications.   Acne -benzoyl peroxide wash -epiduo cream  Allergic rhinitis -zyrtec -flonase -cont singulair  CKD -avoid mobic -talk to GI about getting off pantoprazole  Continue diet and meds as discussed. Further disposition pending results of labs.  HPI 50 y.o. female  presents for 3 month follow up with hypertension, hyperlipidemia, prediabetes and vitamin D.   Her blood pressure has been controlled at home, today their BP is BP: 116/80 mmHg.   She does workout. She denies chest pain, shortness of breath, dizziness.   She is on cholesterol medication and denies myalgias. Her cholesterol is at goal. The cholesterol last visit was:   Lab Results  Component Value Date   CHOL 149 06/30/2015   HDL 75 06/30/2015   LDLCALC 57 06/30/2015   TRIG 86 06/30/2015   CHOLHDL 2.0 06/30/2015     She has been working on diet and exercise for prediabetes, and denies foot ulcerations, hyperglycemia, hypoglycemia , increased appetite, nausea, paresthesia of the feet, polydipsia, polyuria, visual disturbances, vomiting and weight loss. Last A1C in the office was:  Lab Results  Component Value Date   HGBA1C 5.5 06/30/2015    Patient is on Vitamin D supplement.  Lab Results  Component Value Date   VD25OH 46 06/30/2015      Patient reports that she did go to see a kidney doctor.  She reports that they have described her kidney function as mild.    She does have some acne.  She reports that she is not currently using anything.    She reports that she is having  some sinus issues.  She is taking mucinex.  She has had congestion, runny nose, and sinus pressure.  She is also using the singulair.  She is not using any nose spray.    Current Medications:  Current Outpatient Prescriptions on File Prior to Visit  Medication Sig Dispense Refill  . buPROPion (WELLBUTRIN XL) 300 MG 24 hr tablet TAKE 1 TABLET BY MOUTH ONCE EVERY MORNING FOR MOOD 90 tablet 3  . cyclobenzaprine (FLEXERIL) 10 MG tablet Take 1/2 to 1 tablet 3 x day if needed for muscle spasm 90 tablet 3  . famotidine (PEPCID) 40 MG tablet TAKE 1 TABLET BY MOUTH EVERY DAY AT BEDTIME 30 tablet 3  . fluticasone (FLONASE) 50 MCG/ACT nasal spray Place 2 sprays into both nostrils as needed. Reported on 06/27/2015    . hydrocortisone-pramoxine (ANALPRAM HC) 2.5-1 % rectal cream Place 1 application rectally 3 (three) times daily. 30 g 2  . ipratropium (ATROVENT) 0.06 % nasal spray Place 2 sprays into both nostrils 2 (two) times daily as needed. Reported on 06/27/2015    . Linaclotide (LINZESS) 145 MCG CAPS capsule Take 1 capsule (145 mcg total) by mouth daily. 90 capsule 5  . meloxicam (MOBIC) 15 MG tablet Take 1/2 to 1 tablet daily with food for pain & inflammation 90 tablet 99  . montelukast (SINGULAIR) 10 MG tablet TAKE 1 TABLET EVERY DAY 30 tablet 11  . pantoprazole (PROTONIX) 40 MG tablet TAKE 1 TABLET BY MOUTH TWICE A DAY 180 tablet  1  . traZODone (DESYREL) 150 MG tablet TAKE 1 TO 2 TABLETS BY MOUTH 1 HOUR BEFORE SLEEP 180 tablet 1   No current facility-administered medications on file prior to visit.    Medical History:  Past Medical History  Diagnosis Date  . Chest pain, unspecified     normal myoview 2000;  echo 5/10:  mild LVH, EF 60-65%  . Palpitations     h/o symptomatic PVCs  . Restless legs syndrome (RLS)   . Persistent disorder of initiating or maintaining sleep   . Anxiety   . Depression   . Hyperlipidemia   . Back injury   . Barrett's esophagus   . Lumbago   . Insomnia   . HSV-1  (herpes simplex virus 1) infection   . DDD (degenerative disc disease)   . Palpitations   . Melanosis coli   . Hiatal hernia   . IBS (irritable bowel syndrome)   . GERD (gastroesophageal reflux disease)   . Allergy     SEASONAL    Allergies:  Allergies  Allergen Reactions  . Atenolol Other (See Comments)    Made patient feel like a "zombie"  . Ambien [Zolpidem Tartrate]     Memory issues  . Cholestatin     unknown  . Mirapex [Pramipexole Dihydrochloride]     Hot flashes  . Requip [Ropinirole Hcl]     Hot flashes     Review of Systems:  Review of Systems  Constitutional: Negative for fever, chills and malaise/fatigue.  HENT: Negative for congestion, ear pain and sore throat.   Respiratory: Negative for cough, shortness of breath and wheezing.   Cardiovascular: Negative for chest pain, palpitations and leg swelling.  Gastrointestinal: Negative for heartburn, abdominal pain, diarrhea, constipation, blood in stool and melena.  Genitourinary: Negative.   Skin: Negative.   Neurological: Negative for dizziness, sensory change, loss of consciousness and headaches.  Psychiatric/Behavioral: Negative for depression. The patient is not nervous/anxious and does not have insomnia.     Family history- Review and unchanged  Social history- Review and unchanged  Physical Exam: BP 116/80 mmHg  Pulse 76  Temp(Src) 97.8 F (36.6 C) (Temporal)  Resp 16  Ht 5\' 9"  (1.753 m)  Wt 159 lb (72.122 kg)  BMI 23.47 kg/m2 Wt Readings from Last 3 Encounters:  10/18/15 159 lb (72.122 kg)  06/30/15 156 lb 6.4 oz (70.943 kg)  06/27/15 157 lb (71.215 kg)    General Appearance: Well nourished well developed, in no apparent distress. Eyes: PERRLA, EOMs, conjunctiva no swelling or erythema ENT/Mouth: Ear canals normal without obstruction, swelling, erythma, discharge.  TMs normal bilaterally.  Oropharynx moist, clear, without exudate, or postoropharyngeal swelling. Neck: Supple, thyroid  normal,no cervical adenopathy  Respiratory: Respiratory effort normal, Breath sounds clear A&P without rhonchi, wheeze, or rale.  No retractions, no accessory usage. Cardio: RRR with no MRGs. Brisk peripheral pulses without edema.  Abdomen: Soft, + BS,  Non tender, no guarding, rebound, hernias, masses. Musculoskeletal: Full ROM, 5/5 strength, Normal gait Skin: Warm, dry without rashes, lesions, ecchymosis.  Neuro: Awake and oriented X 3, Cranial nerves intact. Normal muscle tone, no cerebellar symptoms. Psych: Normal affect, Insight and Judgment appropriate.    Starlyn Skeans, PA-C 9:16 AM [facility]

## 2015-10-19 ENCOUNTER — Encounter: Payer: Self-pay | Admitting: Internal Medicine

## 2015-10-23 ENCOUNTER — Other Ambulatory Visit: Payer: Self-pay | Admitting: Internal Medicine

## 2015-10-23 MED ORDER — ROSUVASTATIN CALCIUM 20 MG PO TABS: 20.0000 mg | ORAL_TABLET | Freq: Every day | ORAL | Status: DC

## 2015-10-26 ENCOUNTER — Other Ambulatory Visit: Payer: Self-pay | Admitting: Internal Medicine

## 2015-10-26 DIAGNOSIS — M549 Dorsalgia, unspecified: Secondary | ICD-10-CM

## 2015-10-26 DIAGNOSIS — M545 Low back pain, unspecified: Secondary | ICD-10-CM

## 2015-10-26 DIAGNOSIS — G8929 Other chronic pain: Secondary | ICD-10-CM

## 2015-10-26 MED ORDER — ROSUVASTATIN CALCIUM 20 MG PO TABS: ORAL_TABLET | ORAL | Status: DC

## 2015-10-26 MED ORDER — MELOXICAM 15 MG PO TABS: ORAL_TABLET | ORAL | Status: DC

## 2015-10-26 MED ORDER — CYCLOBENZAPRINE HCL 10 MG PO TABS: ORAL_TABLET | ORAL | Status: DC

## 2015-10-27 ENCOUNTER — Other Ambulatory Visit: Payer: Self-pay | Admitting: Internal Medicine

## 2015-10-27 DIAGNOSIS — G8929 Other chronic pain: Secondary | ICD-10-CM

## 2015-10-27 DIAGNOSIS — M549 Dorsalgia, unspecified: Principal | ICD-10-CM

## 2015-11-13 DIAGNOSIS — L820 Inflamed seborrheic keratosis: Secondary | ICD-10-CM | POA: Diagnosis not present

## 2015-11-16 DIAGNOSIS — S139XXA Sprain of joints and ligaments of unspecified parts of neck, initial encounter: Secondary | ICD-10-CM | POA: Diagnosis not present

## 2015-11-22 IMAGING — US US ABDOMEN COMPLETE
1 series · 13 of 25 positions shown · non-contrast
Comparison: CT abdomen and pelvis yesterday.

CLINICAL DATA: Four week history of postprandial abdominal pain and
nausea.

EXAM:
ULTRASOUND ABDOMEN COMPLETE

[Series 1: us abdomen complete · 0.21mm/px · 13 of 107 slices shown]
[im 1/107]
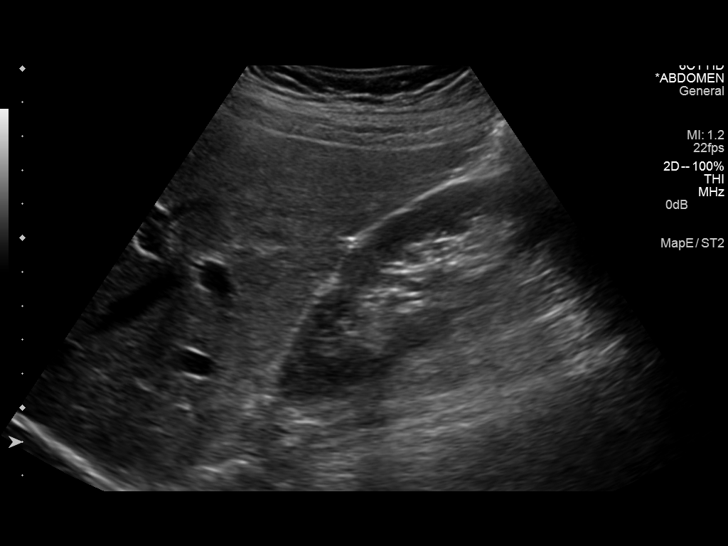
[im 9/107]
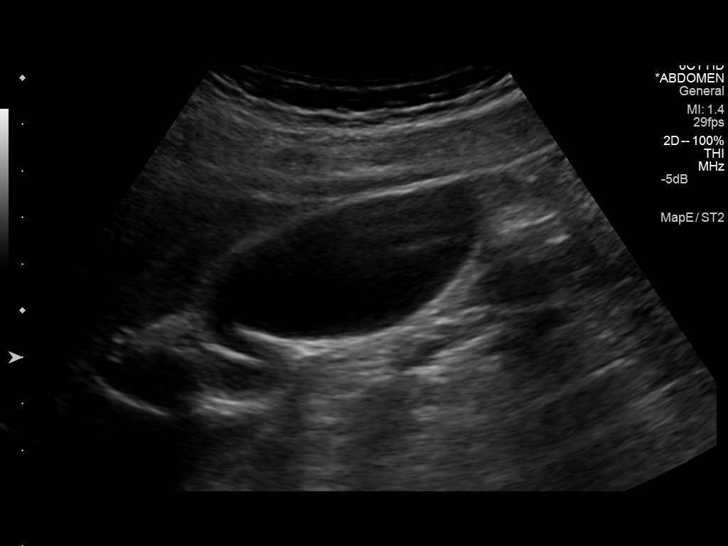
[im 18/107]
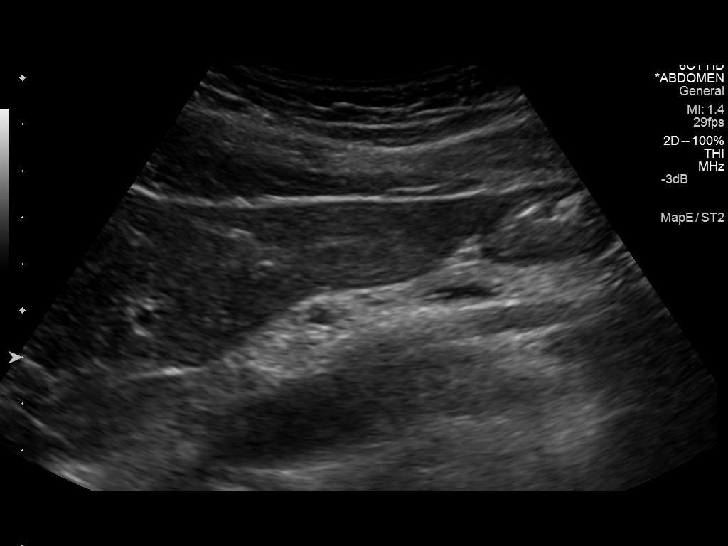
[im 27/107]
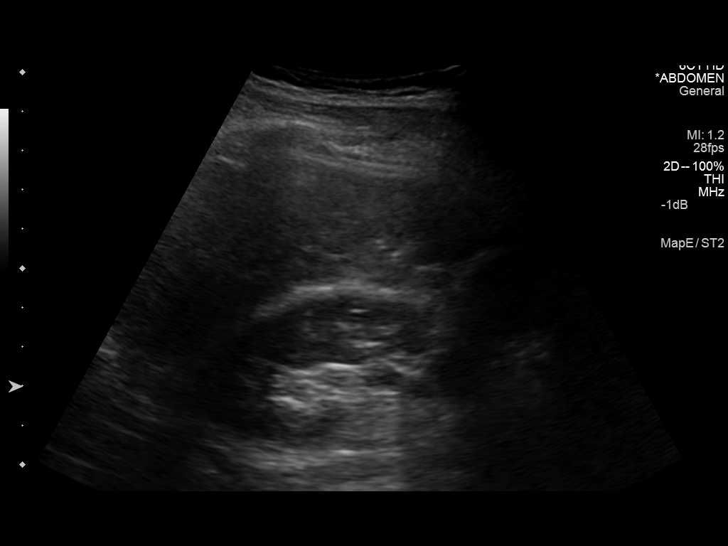
[im 36/107]
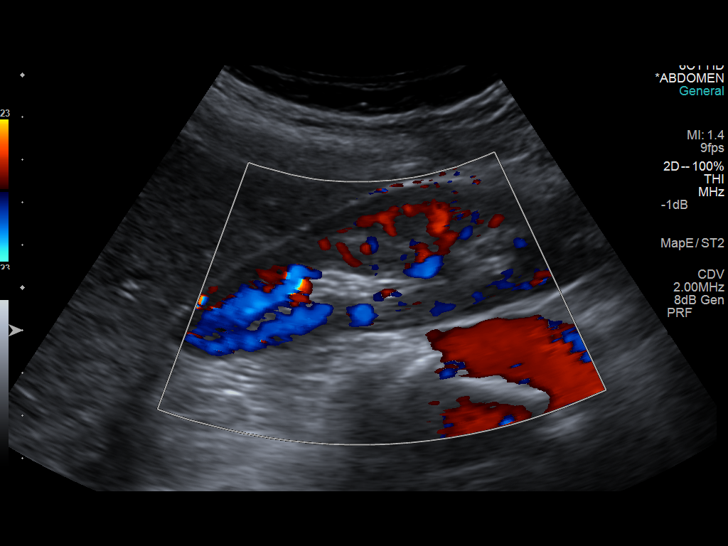
[im 45/107]
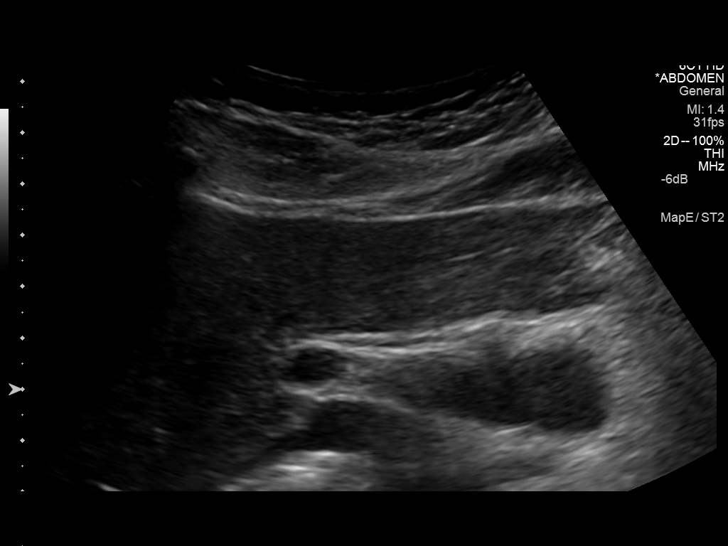
[im 54/107]
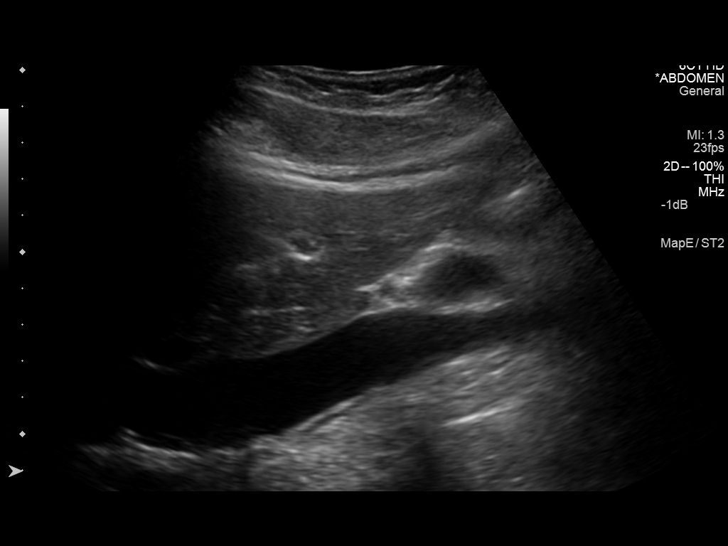
[im 62/107]
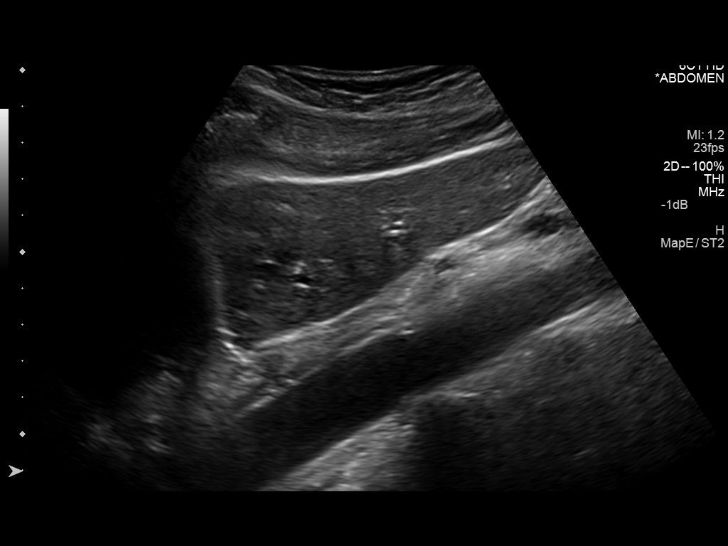
[im 71/107]
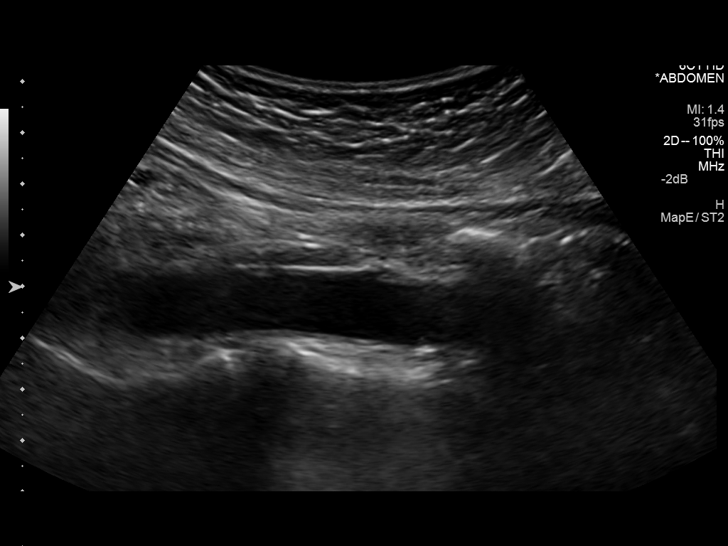
[im 80/107]
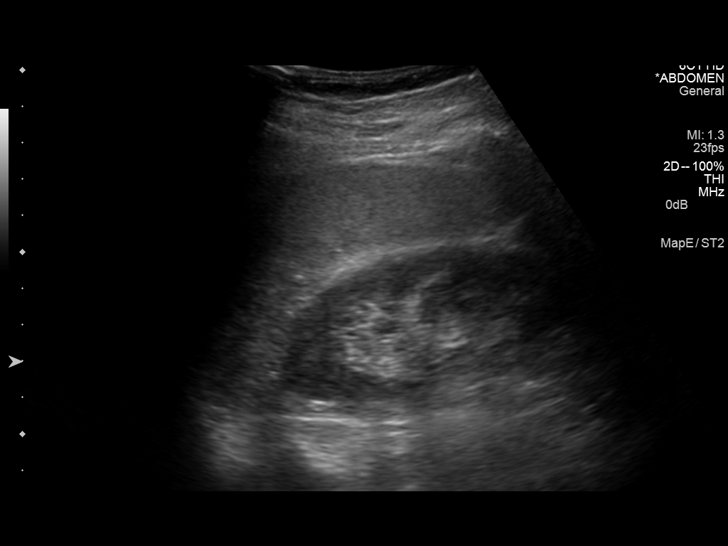
[im 89/107]
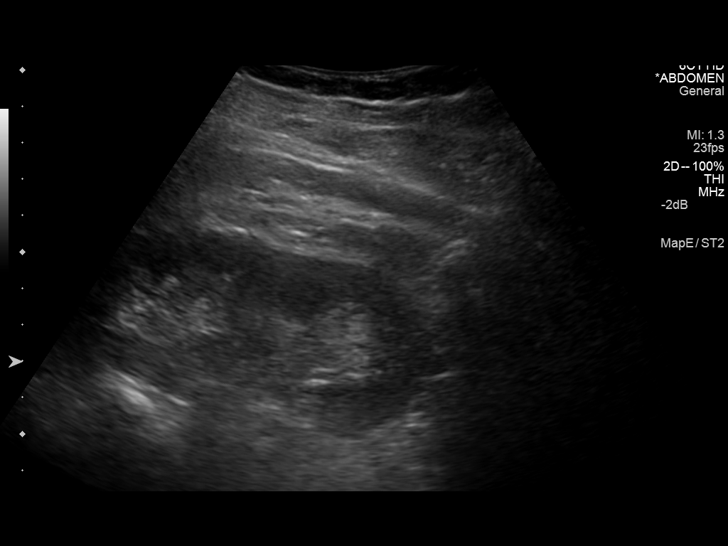
[im 98/107]
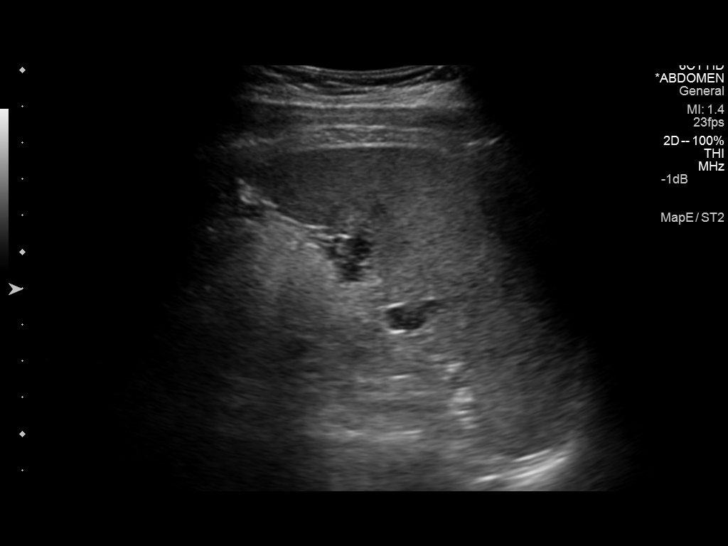
[im 107/107]
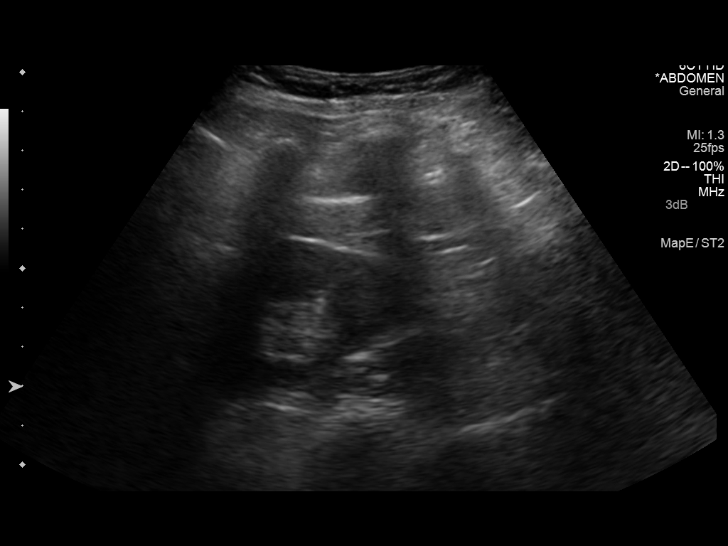

[13 of 25 positions shown; findings below may reference images not displayed]

FINDINGS: Gallbladder: No shadowing gallstones or echogenic sludge. No
gallbladder wall thickening or pericholecystic fluid. Negative
sonographic Murphy sign according to the ultrasound technologist.

Common bile duct: Diameter: Approximately 2 mm.

Liver: Normal size and echotexture without focal parenchymal
abnormality. Patent portal vein with hepatopetal flow.

IVC: Patent.

Pancreas: Normal size and echotexture without focal parenchymal
abnormality.

Spleen: Normal size and echotexture without focal parenchymal
abnormality.

Right Kidney: Length: Approximately 10.7 cm. No hydronephrosis.
Well-preserved cortex. No shadowing calculi. Normal parenchymal
echotexture. No focal parenchymal abnormality.

Left Kidney: Length: Approximately 11.2 cm. No hydronephrosis.
Well-preserved cortex. No shadowing calculi. Normal parenchymal
echotexture. No focal parenchymal abnormality.

Abdominal aorta: Normal in caliber throughout its visualized course
in the abdomen without evidence of significant atherosclerosis.
Maximum diameter 2.1 cm.

Other findings: None.
IMPRESSION: Normal examination.

## 2015-11-23 DIAGNOSIS — M542 Cervicalgia: Secondary | ICD-10-CM | POA: Diagnosis not present

## 2015-12-05 DIAGNOSIS — M542 Cervicalgia: Secondary | ICD-10-CM | POA: Diagnosis not present

## 2015-12-05 DIAGNOSIS — M5412 Radiculopathy, cervical region: Secondary | ICD-10-CM | POA: Diagnosis not present

## 2015-12-07 DIAGNOSIS — M542 Cervicalgia: Secondary | ICD-10-CM | POA: Diagnosis not present

## 2015-12-07 DIAGNOSIS — M5412 Radiculopathy, cervical region: Secondary | ICD-10-CM | POA: Diagnosis not present

## 2015-12-12 ENCOUNTER — Encounter: Payer: Self-pay | Admitting: Women's Health

## 2015-12-12 ENCOUNTER — Ambulatory Visit (INDEPENDENT_AMBULATORY_CARE_PROVIDER_SITE_OTHER): Payer: BLUE CROSS/BLUE SHIELD | Admitting: Women's Health

## 2015-12-12 VITALS — BP 118/78 | Ht 69.0 in | Wt 159.0 lb

## 2015-12-12 DIAGNOSIS — Z01419 Encounter for gynecological examination (general) (routine) without abnormal findings: Secondary | ICD-10-CM

## 2015-12-12 NOTE — Patient Instructions (Signed)

## 2015-12-12 NOTE — Progress Notes (Signed)
Pamela Welch Oct 21, 1965 [id number]    History:    Presents for annual exam.  Monthly cycle,  reports skipped  one month in the past year. Not sexually active. Normal Pap and mammogram history. 2015 negative breast biopsy. Sister breast cancer, negative BRCA. History of GI issues had a negative colonoscopy and is doing better on Linzes.  Primary care manages hypercholesterolemia.  Past medical history, past surgical history, family history and social history were all reviewed and documented in the EPIC chart. 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 Twins ex partner delivered, 28 yo, joint custody. Parents hypertension, father hypercholesterolemia, mother osteoporosis.  ROS:  A ROS was performed and pertinent positives and negatives are included.  Exam:  Filed Vitals:   12/12/15 1534  BP: 118/78    General appearance:  Normal Thyroid:  Symmetrical, normal in size, without palpable masses or nodularity. Respiratory  Auscultation:  Clear without wheezing or rhonchi Cardiovascular  Auscultation:  Regular rate, without rubs, murmurs or gallops  Edema/varicosities:  Not grossly evident Abdominal  Soft,nontender, without masses, guarding or rebound.  Liver/spleen:  No organomegaly noted  Hernia:  None appreciated  Skin  Inspection:  Grossly normal   Breasts: Examined lying and sitting.     Right: Without masses, retractions, discharge or axillary adenopathy.     Left: Without masses, retractions, discharge or axillary adenopathy. Gentitourinary   Inguinal/mons:  Normal without inguinal adenopathy  External genitalia:  Normal  BUS/Urethra/Skene's glands:  Normal  Vagina:  Normal  Cervix:  Normal  Uterus:   normal in size, shape and contour.  Midline and mobile  Adnexa/parametria:     Rt: Without masses or tenderness.   Lt: Without masses or tenderness.  Anus and perineum: Normal  Digital rectal exam: Normal sphincter tone without palpated masses or tenderness  Assessment/Plan:  50 y.o. S [place]  lesbian G0 + twins for annual exam no complaints.  Monthly cycle hypercholesteremia primary care manages labs and meds IBS/Barrett's esophagus/GERD-gastroenterologist manages  Plan: SBE's, continue annual screening mammogram 3-D tomography reviewed and encouraged. Continue regular exercise, calcium rich diet, vitamin D 1000 daily encouraged. Leisure activities encouraged. 2016 Pap normal with negative HR HPV typing new screening guidelines reviewed.   Huel Cote WHNP, 3:59 PM 12/12/2015

## 2015-12-13 LAB — URINALYSIS W MICROSCOPIC + REFLEX CULTURE
BILIRUBIN URINE: NEGATIVE
Bacteria, UA: NONE SEEN [HPF]
CASTS: NONE SEEN [LPF]
GLUCOSE, UA: NEGATIVE
HGB URINE DIPSTICK: NEGATIVE
LEUKOCYTES UA: NEGATIVE
NITRITE: NEGATIVE
PH: 5.5 (ref 5.0–8.0)
Protein, ur: NEGATIVE
RBC / HPF: NONE SEEN RBC/HPF (ref <=2)
SPECIFIC GRAVITY, URINE: 1.028 (ref 1.001–1.035)
SQUAMOUS EPITHELIAL / LPF: NONE SEEN [HPF] (ref <=5)
WBC UA: NONE SEEN WBC/HPF (ref <=5)
Yeast: NONE SEEN [HPF]

## 2015-12-25 DIAGNOSIS — M542 Cervicalgia: Secondary | ICD-10-CM | POA: Diagnosis not present

## 2015-12-25 DIAGNOSIS — M5412 Radiculopathy, cervical region: Secondary | ICD-10-CM | POA: Diagnosis not present

## 2016-01-05 DIAGNOSIS — M542 Cervicalgia: Secondary | ICD-10-CM | POA: Diagnosis not present

## 2016-01-05 DIAGNOSIS — M5412 Radiculopathy, cervical region: Secondary | ICD-10-CM | POA: Diagnosis not present

## 2016-01-08 ENCOUNTER — Other Ambulatory Visit: Payer: Self-pay | Admitting: Internal Medicine

## 2016-01-18 ENCOUNTER — Encounter: Payer: Self-pay | Admitting: Internal Medicine

## 2016-01-18 ENCOUNTER — Ambulatory Visit (INDEPENDENT_AMBULATORY_CARE_PROVIDER_SITE_OTHER): Payer: BLUE CROSS/BLUE SHIELD | Admitting: Internal Medicine

## 2016-01-18 VITALS — BP 104/72 | HR 64 | Temp 97.7°F | Resp 16 | Ht 69.0 in | Wt 158.4 lb

## 2016-01-18 DIAGNOSIS — R7309 Other abnormal glucose: Secondary | ICD-10-CM

## 2016-01-18 DIAGNOSIS — M545 Low back pain, unspecified: Secondary | ICD-10-CM

## 2016-01-18 DIAGNOSIS — R030 Elevated blood-pressure reading, without diagnosis of hypertension: Secondary | ICD-10-CM

## 2016-01-18 DIAGNOSIS — IMO0001 Reserved for inherently not codable concepts without codable children: Secondary | ICD-10-CM

## 2016-01-18 DIAGNOSIS — K219 Gastro-esophageal reflux disease without esophagitis: Secondary | ICD-10-CM | POA: Diagnosis not present

## 2016-01-18 DIAGNOSIS — E559 Vitamin D deficiency, unspecified: Secondary | ICD-10-CM | POA: Diagnosis not present

## 2016-01-18 DIAGNOSIS — Z79899 Other long term (current) drug therapy: Secondary | ICD-10-CM

## 2016-01-18 DIAGNOSIS — E785 Hyperlipidemia, unspecified: Secondary | ICD-10-CM

## 2016-01-18 LAB — CBC WITH DIFFERENTIAL/PLATELET
Basophils Absolute: 60 cells/uL (ref 0–200)
Basophils Relative: 1 %
Eosinophils Absolute: 120 cells/uL (ref 15–500)
Eosinophils Relative: 2 %
HCT: 39.8 % (ref 35.0–45.0)
Hemoglobin: 13.2 g/dL (ref 11.7–15.5)
LYMPHS ABS: 1920 {cells}/uL (ref 850–3900)
LYMPHS PCT: 32 %
MCH: 29.7 pg (ref 27.0–33.0)
MCHC: 33.2 g/dL (ref 32.0–36.0)
MCV: 89.4 fL (ref 80.0–100.0)
MONO ABS: 300 {cells}/uL (ref 200–950)
MPV: 9.9 fL (ref 7.5–12.5)
Monocytes Relative: 5 %
NEUTROS ABS: 3600 {cells}/uL (ref 1500–7800)
Neutrophils Relative %: 60 %
PLATELETS: 251 10*3/uL (ref 140–400)
RBC: 4.45 MIL/uL (ref 3.80–5.10)
RDW: 12.9 % (ref 11.0–15.0)
WBC: 6 10*3/uL (ref 3.8–10.8)

## 2016-01-18 LAB — TSH: TSH: 3.81 mIU/L

## 2016-01-18 LAB — HEPATIC FUNCTION PANEL
ALBUMIN: 4.2 g/dL (ref 3.6–5.1)
ALK PHOS: 53 U/L (ref 33–130)
ALT: 16 U/L (ref 6–29)
AST: 19 U/L (ref 10–35)
BILIRUBIN INDIRECT: 0.4 mg/dL (ref 0.2–1.2)
BILIRUBIN TOTAL: 0.5 mg/dL (ref 0.2–1.2)
Bilirubin, Direct: 0.1 mg/dL (ref <=0.2)
Total Protein: 7 g/dL (ref 6.1–8.1)

## 2016-01-18 LAB — LIPID PANEL
CHOL/HDL RATIO: 1.9 ratio (ref <=5.0)
Cholesterol: 177 mg/dL (ref 125–200)
HDL: 93 mg/dL (ref >=46)
LDL CALC: 69 mg/dL (ref <130)
TRIGLYCERIDES: 73 mg/dL (ref <150)
VLDL: 15 mg/dL (ref <30)

## 2016-01-18 LAB — BASIC METABOLIC PANEL WITH GFR
BUN: 13 mg/dL (ref 7–25)
CALCIUM: 9.4 mg/dL (ref 8.6–10.4)
CO2: 27 mmol/L (ref 20–31)
CREATININE: 1.07 mg/dL — AB (ref 0.50–1.05)
Chloride: 102 mmol/L (ref 98–110)
GFR, Est African American: 69 mL/min (ref >=60)
GFR, Est Non African American: 60 mL/min (ref >=60)
Glucose, Bld: 81 mg/dL (ref 65–99)
Potassium: 4.2 mmol/L (ref 3.5–5.3)
SODIUM: 140 mmol/L (ref 135–146)

## 2016-01-18 LAB — MAGNESIUM: MAGNESIUM: 1.8 mg/dL (ref 1.5–2.5)

## 2016-01-18 MED ORDER — GABAPENTIN 100 MG PO CAPS: ORAL_CAPSULE | ORAL | 3 refills | Status: DC

## 2016-01-18 NOTE — Progress Notes (Signed)
ADULT & ADOLESCENT INTERNAL MEDICINE                       Unk Pinto, M.D.        Uvaldo Bristle. Silverio Lay, P.A.-C       Starlyn Skeans, P.A.-C   [facility]                [address] [place], [place] [id number] Telephone [phone] Telefax [phone] ______________________________________________________________________     This very nice 50y.o.Single WF presents for 6 month follow up with  Hx/o elevated BP, Hyperlipidemia, Pre-Diabetes and Vitamin D Deficiency. Patient also has hx/o DDD & chronic LBP which compounds her chronic insomnia.  Also, she has hx/o IBS-C stable on Amitiza and GERD likewise stable on Protonix & Pepcid.    Patient has remote hx/o mildly elevated BP and consequently is expectantly monitored for labile HTN circa 2004  & BP has been controlled at home. Today's BP: 104/72. Patient has had no complaints of any cardiac type chest pain, palpitations, dyspnea/orthopnea/PND, dizziness, claudication, or dependent edema.     Hyperlipidemia was not controlled by last labs off meds & with diet alone  & she restarted her Atorvastatin. Patient denies myalgias or other med SE's. Last Lipids were not at goal: Lab Results  Component Value Date   CHOL 246 (H) 10/18/2015   HDL 70 10/18/2015   LDLCALC 159 (H) 10/18/2015   TRIG 87 10/18/2015   CHOLHDL 3.5 10/18/2015      Also, the patient has history of elevated glucose and is monitored expectantly for PreDiabetes and has had no symptoms of reactive hypoglycemia, diabetic polys, paresthesias or visual blurring.  Last A1c was at goal: Lab Results  Component Value Date   HGBA1C 5.2 10/18/2015      Further, the patient also has history of Vitamin D Deficiency of "36" in 2012 and supplements vitamin D without any suspected side-effects. Last vitamin D was   Lab Results  Component Value Date   VD25OH 46 06/30/2015   Current Outpatient Prescriptions on File Prior to  Visit  Medication Sig  . buPROPion (WELLBUTRIN XL) 300 MG 24 hr tablet TAKE 1 TABLET BY MOUTH ONCE EVERY MORNING FOR MOOD  . cyclobenzaprine (FLEXERIL) 10 MG tablet Take 1/2 to 1 tablet 3 x day if needed for muscle spasm  . famotidine (PEPCID) 40 MG tablet Take 1 tablet (40 mg total) by mouth at bedtime.  . fluticasone (FLONASE) 50 MCG/ACT nasal spray Place 2 sprays into both nostrils as needed. Reported on 06/27/2015  . hydrocortisone-pramoxine (ANALPRAM HC) 2.5-1 % rectal cream Place 1 application rectally 3 (three) times daily.  Marland Kitchen ipratropium (ATROVENT) 0.06 % nasal spray Place 2 sprays into both nostrils 2 (two) times daily as needed. Reported on 06/27/2015  . linaclotide (LINZESS) 145 MCG CAPS capsule Take 1 capsule (145 mcg total) by mouth daily.  Marland Kitchen LORazepam (ATIVAN) 2 MG tablet TAKE 1 BY MOUTH AT BEDTIME  . meloxicam (MOBIC) 15 MG tablet Take 1/2 to 1 tablet daily with food for pain & inflammation  . montelukast (SINGULAIR) 10 MG tablet Take 1 tablet (10 mg total) by mouth daily.  . pantoprazole (PROTONIX) 40 MG tablet Take 1 tablet (40 mg total) by mouth 2 (two) times daily.  . rosuvastatin (CRESTOR) 20 MG tablet Take 1 tablet daily for cholesterol  . traZODone (DESYREL) 150  MG tablet TAKE 1 TO 2 TABLETS BY MOUTH 1 HOUR BEFORE SLEEP   No current facility-administered medications on file prior to visit.    Allergies  Allergen Reactions  . Atenolol Other (See Comments)    Made patient feel like a "zombie"  . Ambien [Zolpidem Tartrate]     Memory issues  . Cholestatin     unknown  . Mirapex [Pramipexole Dihydrochloride]     Hot flashes  . Requip [Ropinirole Hcl]     Hot flashes   PMHx:   Past Medical History:  Diagnosis Date  . Allergy    SEASONAL  . Anxiety   . Back injury   . Barrett's esophagus   . Chest pain, unspecified    normal myoview 2000;  echo 5/10:  mild LVH, EF 60-65%  . DDD (degenerative disc disease)   . Depression   . GERD (gastroesophageal reflux  disease)   . Hiatal hernia   . HSV-1 (herpes simplex virus 1) infection   . Hyperlipidemia   . IBS (irritable bowel syndrome)   . Insomnia   . Lumbago   . Melanosis coli   . Palpitations    h/o symptomatic PVCs  . Palpitations   . Persistent disorder of initiating or maintaining sleep   . Restless legs syndrome (RLS)    Immunization History  Administered Date(s) Administered  . DTaP 04/08/2012  . Influenza Split 04/15/2013, 04/27/2014  . Influenza-Unspecified 03/27/2012  . PPD Test 04/15/2013, 04/27/2014, 06/30/2015  . Pneumococcal-Unspecified 05/27/2009  . Td 05/27/2001   Past Surgical History:  Procedure Laterality Date  . COLONOSCOPY  2013  . ESOPHAGOGASTRODUODENOSCOPY N/A 03/04/2014   Procedure: ESOPHAGOGASTRODUODENOSCOPY (EGD);  Surgeon: Ladene Artist, MD;  Location: [facility];  Service: Endoscopy;  Laterality: N/A;  . EYE SURGERY  2001  . REFRACTIVE SURGERY     bilateral eyes   . TUBES IN EARS     as an adult   FHx:    Reviewed / unchanged  SHx:    Reviewed / unchanged  Systems Review:  Constitutional: Denies fever, chills, wt changes, headaches, insomnia, fatigue, night sweats, change in appetite. Eyes: Denies redness, blurred vision, diplopia, discharge, itchy, watery eyes.  ENT: Denies discharge, congestion, post nasal drip, epistaxis, sore throat, earache, hearing loss, dental pain, tinnitus, vertigo, sinus pain, snoring.  CV: Denies chest pain, palpitations, irregular heartbeat, syncope, dyspnea, diaphoresis, orthopnea, PND, claudication or edema. Respiratory: denies cough, dyspnea, DOE, pleurisy, hoarseness, laryngitis, wheezing.  Gastrointestinal: Denies dysphagia, odynophagia, heartburn, reflux, water brash, abdominal pain or cramps, nausea, vomiting, bloating, diarrhea, constipation, hematemesis, melena, hematochezia  or hemorrhoids. Genitourinary: Denies dysuria, frequency, urgency, nocturia, hesitancy, discharge, hematuria or flank  pain. Musculoskeletal: Denies arthralgias, myalgias, stiffness, jt. swelling, pain, limping or strain/sprain.  Skin: Denies pruritus, rash, hives, warts, acne, eczema or change in skin lesion(s). Neuro: No weakness, tremor, incoordination, spasms, paresthesia or pain. Psychiatric: Denies confusion, memory loss or sensory loss. Endo: Denies change in weight, skin or hair change.  Heme/Lymph: No excessive bleeding, bruising or enlarged lymph nodes.  Physical Exam BP 104/72   Pulse 64   Temp 97.7 F (36.5 C)   Resp 16   Ht 5\' 9"  (1.753 m)   Wt 158 lb 6.4 oz (71.8 kg)   BMI 23.39 kg/m   Appears well nourished and in no distress.  Eyes: PERRLA, EOMs, conjunctiva no swelling or erythema. Sinuses: No frontal/maxillary tenderness ENT/Mouth: EAC's clear, TM's nl w/o erythema, bulging. Nares clear w/o erythema, swelling, exudates. Oropharynx clear without  erythema or exudates. Oral hygiene is good. Tongue normal, non obstructing. Hearing intact.  Neck: Supple. Thyroid nl. Car 2+/2+ without bruits, nodes or JVD. Chest: Respirations nl with BS clear & equal w/o rales, rhonchi, wheezing or stridor.  Cor: Heart sounds normal w/ regular rate and rhythm without sig. murmurs, gallops, clicks, or rubs. Peripheral pulses normal and equal  without edema.  Abdomen: Soft & bowel sounds normal. Non-tender w/o guarding, rebound, hernias, masses, or organomegaly.  Lymphatics: Unremarkable.  Musculoskeletal: Full ROM all peripheral extremities, joint stability, 5/5 strength, and normal gait.  Skin: Warm, dry without exposed rashes, lesions or ecchymosis apparent.  Neuro: Cranial nerves intact, reflexes equal bilaterally. Sensory-motor testing grossly intact. Tendon reflexes grossly intact.  Pysch: Alert & oriented x 3.  Insight and judgement nl & appropriate. No ideations.  Assessment and Plan:  1. Labile HTN  - Continue medication, monitor blood pressure at home. Continue DASH diet. Reminder to go to  the ER if any CP, SOB, nausea, dizziness, severe HA, changes vision/speech, left arm numbness and tingling and jaw pain. - Magnesium - TSH  2. Hyperlipidemia  - Continue diet/meds, exercise,& lifestyle modifications. Continue monitor periodic cholesterol/liver & renal functions  - Lipid panel - TSH  3. Abnormal glucose  - Continue diet, exercise, lifestyle modifications. Monitor appropriate labs. - Hemoglobin A1c - Insulin, random  4. Vitamin D deficiency  - Continue supplementation. - VITAMIN D 25 Hydroxy   5. Gastroesophageal reflux disease   6. Midline low back pain without sciatica  - gabapentin (NEURONTIN) 100 MG capsule; Take 1 to 2 caps 3 x/day as needed for back pain  Dispense: 90 capsule; Refill: 3 -Discussed gradual titration of increasing bedtime dosing to allow tapering to d/c hs Lorazepam and also help with both insomnia as well as her back pain.  7. Medication management  - CBC with Differential/Platelet - BASIC METABOLIC PANEL WITH GFR - Hepatic function panel   Recommended regular exercise, BP monitoring, weight control, and discussed med and SE's. Recommended labs to assess and monitor clinical status. Further disposition pending results of labs. Over 30 minutes of exam, counseling, chart review was performed

## 2016-01-18 NOTE — Patient Instructions (Signed)

## 2016-01-19 LAB — VITAMIN D 25 HYDROXY (VIT D DEFICIENCY, FRACTURES): Vit D, 25-Hydroxy: 49 ng/mL (ref 30–100)

## 2016-01-19 LAB — INSULIN, RANDOM: INSULIN: 3.9 u[IU]/mL (ref 2.0–19.6)

## 2016-01-19 LAB — HEMOGLOBIN A1C
Hgb A1c MFr Bld: 5.1 % (ref <5.7)
MEAN PLASMA GLUCOSE: 100 mg/dL

## 2016-01-23 ENCOUNTER — Other Ambulatory Visit: Payer: Self-pay | Admitting: Internal Medicine

## 2016-01-23 ENCOUNTER — Encounter: Payer: Self-pay | Admitting: Internal Medicine

## 2016-01-23 DIAGNOSIS — H0014 Chalazion left upper eyelid: Secondary | ICD-10-CM | POA: Diagnosis not present

## 2016-01-23 MED ORDER — DOXYCYCLINE HYCLATE 100 MG PO CAPS: ORAL_CAPSULE | ORAL | 0 refills | Status: AC

## 2016-01-23 MED ORDER — NEOMYCIN-POLYMYXIN-DEXAMETH 3.5-10000-0.1 OP SUSP: OPHTHALMIC | 1 refills | Status: DC

## 2016-01-29 ENCOUNTER — Encounter: Payer: Self-pay | Admitting: *Deleted

## 2016-01-30 DIAGNOSIS — M5415 Radiculopathy, thoracolumbar region: Secondary | ICD-10-CM | POA: Diagnosis not present

## 2016-01-30 DIAGNOSIS — M542 Cervicalgia: Secondary | ICD-10-CM | POA: Diagnosis not present

## 2016-01-30 DIAGNOSIS — M5412 Radiculopathy, cervical region: Secondary | ICD-10-CM | POA: Diagnosis not present

## 2016-01-30 DIAGNOSIS — M5136 Other intervertebral disc degeneration, lumbar region: Secondary | ICD-10-CM | POA: Diagnosis not present

## 2016-02-05 DIAGNOSIS — M545 Low back pain: Secondary | ICD-10-CM | POA: Diagnosis not present

## 2016-03-26 ENCOUNTER — Other Ambulatory Visit: Payer: Self-pay | Admitting: Internal Medicine

## 2016-03-26 DIAGNOSIS — M545 Low back pain, unspecified: Secondary | ICD-10-CM

## 2016-03-27 ENCOUNTER — Other Ambulatory Visit: Payer: Self-pay | Admitting: Internal Medicine

## 2016-03-27 ENCOUNTER — Encounter: Payer: Self-pay | Admitting: Physician Assistant

## 2016-03-27 DIAGNOSIS — G4700 Insomnia, unspecified: Secondary | ICD-10-CM

## 2016-03-27 DIAGNOSIS — G8929 Other chronic pain: Secondary | ICD-10-CM

## 2016-03-27 DIAGNOSIS — M549 Dorsalgia, unspecified: Secondary | ICD-10-CM

## 2016-03-27 DIAGNOSIS — M545 Low back pain, unspecified: Secondary | ICD-10-CM

## 2016-03-27 MED ORDER — TRAZODONE HCL 150 MG PO TABS: ORAL_TABLET | ORAL | 1 refills | Status: DC

## 2016-03-27 MED ORDER — MELOXICAM 15 MG PO TABS: ORAL_TABLET | ORAL | 1 refills | Status: DC

## 2016-03-27 MED ORDER — LORAZEPAM 2 MG PO TABS: ORAL_TABLET | ORAL | 0 refills | Status: DC

## 2016-03-27 MED ORDER — CYCLOBENZAPRINE HCL 10 MG PO TABS: ORAL_TABLET | ORAL | 0 refills | Status: DC

## 2016-03-28 MED ORDER — MELOXICAM 15 MG PO TABS: ORAL_TABLET | ORAL | 1 refills | Status: DC

## 2016-03-28 MED ORDER — CYCLOBENZAPRINE HCL 10 MG PO TABS: ORAL_TABLET | ORAL | 0 refills | Status: AC

## 2016-03-28 MED ORDER — TRAZODONE HCL 150 MG PO TABS: ORAL_TABLET | ORAL | 1 refills | Status: DC

## 2016-04-23 ENCOUNTER — Encounter: Payer: Self-pay | Admitting: Physician Assistant

## 2016-04-23 ENCOUNTER — Ambulatory Visit (INDEPENDENT_AMBULATORY_CARE_PROVIDER_SITE_OTHER): Payer: BLUE CROSS/BLUE SHIELD | Admitting: Physician Assistant

## 2016-04-23 VITALS — BP 124/70 | HR 69 | Resp 16 | Ht 69.0 in | Wt 169.2 lb

## 2016-04-23 DIAGNOSIS — R0989 Other specified symptoms and signs involving the circulatory and respiratory systems: Secondary | ICD-10-CM

## 2016-04-23 DIAGNOSIS — E785 Hyperlipidemia, unspecified: Secondary | ICD-10-CM | POA: Diagnosis not present

## 2016-04-23 DIAGNOSIS — R7309 Other abnormal glucose: Secondary | ICD-10-CM | POA: Diagnosis not present

## 2016-04-23 DIAGNOSIS — Z23 Encounter for immunization: Secondary | ICD-10-CM

## 2016-04-23 DIAGNOSIS — Z79899 Other long term (current) drug therapy: Secondary | ICD-10-CM

## 2016-04-23 DIAGNOSIS — E559 Vitamin D deficiency, unspecified: Secondary | ICD-10-CM | POA: Diagnosis not present

## 2016-04-23 DIAGNOSIS — N182 Chronic kidney disease, stage 2 (mild): Secondary | ICD-10-CM

## 2016-04-23 DIAGNOSIS — I10 Essential (primary) hypertension: Secondary | ICD-10-CM

## 2016-04-23 LAB — CBC WITH DIFFERENTIAL/PLATELET
BASOS ABS: 0 {cells}/uL (ref 0–200)
Basophils Relative: 0 %
EOS ABS: 112 {cells}/uL (ref 15–500)
Eosinophils Relative: 2 %
HEMATOCRIT: 38.8 % (ref 35.0–45.0)
Hemoglobin: 12.8 g/dL (ref 11.7–15.5)
Lymphocytes Relative: 27 %
Lymphs Abs: 1512 cells/uL (ref 850–3900)
MCH: 29.1 pg (ref 27.0–33.0)
MCHC: 33 g/dL (ref 32.0–36.0)
MCV: 88.2 fL (ref 80.0–100.0)
MONOS PCT: 6 %
MPV: 9.6 fL (ref 7.5–12.5)
Monocytes Absolute: 336 cells/uL (ref 200–950)
NEUTROS ABS: 3640 {cells}/uL (ref 1500–7800)
Neutrophils Relative %: 65 %
PLATELETS: 259 10*3/uL (ref 140–400)
RBC: 4.4 MIL/uL (ref 3.80–5.10)
RDW: 12.9 % (ref 11.0–15.0)
WBC: 5.6 10*3/uL (ref 3.8–10.8)

## 2016-04-23 LAB — TSH: TSH: 2.03 mIU/L

## 2016-04-23 NOTE — Progress Notes (Signed)
Assessment and Plan:  Hypertension -Continue medication, monitor blood pressure at home. Continue DASH diet.  Reminder to go to the ER if any CP, SOB, nausea, dizziness, severe HA, changes vision/speech, left arm numbness and tingling and jaw pain.  Cholesterol -Continue diet and exercise. Check cholesterol.    Prediabetes  -Continue diet and exercise. Check A1C   Vitamin D Def - check level and continue medications.   Menopause/depression Has not had period x 4 months Continue wellbutrin  Continue diet and meds as discussed. Further disposition pending results of labs. Over 30 minutes of exam, counseling, chart review, and critical decision making was performed  Future Appointments Date Time Provider [place]  09/18/2016 9:00 AM Unk Pinto, MD [facility] None     HPI 50 y.o. female  presents for 3 month follow up on hypertension, cholesterol, prediabetes, and vitamin D deficiency.   Her blood pressure has been controlled at home, today their BP is BP: 124/70  She does workout. She denies chest pain, shortness of breath, dizziness.  She is on cholesterol medication and denies myalgias. Her cholesterol is at goal. The cholesterol last visit was:   Lab Results  Component Value Date   CHOL 177 01/18/2016   HDL 93 01/18/2016   LDLCALC 69 01/18/2016   TRIG 73 01/18/2016   CHOLHDL 1.9 01/18/2016    She has been working on diet and exercise for prediabetes, and denies paresthesia of the feet, polydipsia, polyuria and visual disturbances. Last A1C in the office was:  Lab Results  Component Value Date   HGBA1C 5.1 01/18/2016   Patient is on Vitamin D supplement.   Lab Results  Component Value Date   VD25OH 59 01/18/2016     She is backed off from lorazepam 2mg  to 1 mg, and gabapentin 100mg  or 200mg  and she is on trazodone 150mg .  BMI is Body mass index is 24.99 kg/m., she is working on diet and exercise. Wt Readings from Last 3 Encounters:  04/23/16 169 lb 3.2  oz (76.7 kg)  01/18/16 158 lb 6.4 oz (71.8 kg)  12/12/15 159 lb (72.1 kg)    Current Medications:  Current Outpatient Prescriptions on File Prior to Visit  Medication Sig Dispense Refill  . buPROPion (WELLBUTRIN XL) 300 MG 24 hr tablet TAKE 1 TABLET BY MOUTH ONCE EVERY MORNING FOR MOOD 90 tablet 3  . cyclobenzaprine (FLEXERIL) 10 MG tablet Take 1/2 to 1 tablet 3 x day if needed for muscle spasm 90 tablet 0  . EPIDUO 0.1-2.5 % gel PLACE A SMALL LAYER ON AFFECTED SKIN AT BEDTIME  0  . famotidine (PEPCID) 40 MG tablet Take 1 tablet (40 mg total) by mouth at bedtime. 90 tablet 3  . fluticasone (FLONASE) 50 MCG/ACT nasal spray Place 2 sprays into both nostrils as needed. Reported on 06/27/2015    . gabapentin (NEURONTIN) 100 MG capsule TAKE 1 TO 2 CAPSULES BY MOUTH 3 TIMES DAILY AS NEEDED FOR BACK PAIN 270 capsule 1  . hydrocortisone-pramoxine (ANALPRAM HC) 2.5-1 % rectal cream Place 1 application rectally 3 (three) times daily. 30 g 2  . ipratropium (ATROVENT) 0.06 % nasal spray Place 2 sprays into both nostrils 2 (two) times daily as needed. Reported on 06/27/2015    . linaclotide (LINZESS) 145 MCG CAPS capsule Take 1 capsule (145 mcg total) by mouth daily. 90 capsule 3  . LORazepam (ATIVAN) 2 MG tablet TAKE 1 BY MOUTH AT BEDTIME 90 tablet 0  . meloxicam (MOBIC) 15 MG tablet Take 1/2  to 1 tablet daily with food for pain & inflammation 90 tablet 1  . montelukast (SINGULAIR) 10 MG tablet Take 1 tablet (10 mg total) by mouth daily. 90 tablet 3  . pantoprazole (PROTONIX) 40 MG tablet Take 1 tablet (40 mg total) by mouth 2 (two) times daily. 180 tablet 3  . rosuvastatin (CRESTOR) 20 MG tablet Take 1 tablet daily for cholesterol 90 tablet 1  . traZODone (DESYREL) 150 MG tablet TAKE 1-2 TABs 1 HOUR BEFORE SLEEP 360 tablet 1  . valACYclovir (VALTREX) 500 MG tablet   2   No current facility-administered medications on file prior to visit.    Medical History:  Past Medical History:  Diagnosis Date  .  Allergy    SEASONAL  . Anxiety   . Back injury   . Barrett's esophagus   . Chest pain, unspecified    normal myoview 2000;  echo 5/10:  mild LVH, EF 60-65%  . DDD (degenerative disc disease)   . Depression   . GERD (gastroesophageal reflux disease)   . Hiatal hernia   . HSV-1 (herpes simplex virus 1) infection   . Hyperlipidemia   . IBS (irritable bowel syndrome)   . Insomnia   . Lumbago   . Melanosis coli   . Palpitations    h/o symptomatic PVCs  . Palpitations   . Persistent disorder of initiating or maintaining sleep   . Restless legs syndrome (RLS)    Allergies:  Allergies  Allergen Reactions  . Atenolol Other (See Comments)    Made patient feel like a "zombie"  . Ambien [Zolpidem Tartrate]     Memory issues  . Cholestatin     unknown  . Mirapex [Pramipexole Dihydrochloride]     Hot flashes  . Requip [Ropinirole Hcl]     Hot flashes     Review of Systems:  Review of Systems  Constitutional: Negative for chills, fever and weight loss.  HENT: Negative for congestion, nosebleeds, sore throat and tinnitus.   Eyes: Negative.   Respiratory: Negative for cough, shortness of breath and wheezing.   Cardiovascular: Negative for chest pain, palpitations and leg swelling.  Gastrointestinal: Negative for abdominal pain, blood in stool, constipation, diarrhea, heartburn, melena, nausea and vomiting.  Genitourinary: Negative.   Musculoskeletal: Negative for back pain, joint pain, myalgias and neck pain.  Skin: Negative.   Neurological: Negative for dizziness, tingling, sensory change and headaches.  Psychiatric/Behavioral: Negative for depression, hallucinations, memory loss, substance abuse and suicidal ideas. The patient is not nervous/anxious and does not have insomnia.     Family history- Review and unchanged Social history- Review and unchanged Physical Exam: BP 124/70   Pulse 69   Resp 16   Ht 5\' 9"  (1.753 m)   Wt 169 lb 3.2 oz (76.7 kg)   LMP 12/26/2015    SpO2 99%   BMI 24.99 kg/m  Wt Readings from Last 3 Encounters:  04/23/16 169 lb 3.2 oz (76.7 kg)  01/18/16 158 lb 6.4 oz (71.8 kg)  12/12/15 159 lb (72.1 kg)   General Appearance: Well nourished, in no apparent distress. Eyes: PERRLA, EOMs, conjunctiva no swelling or erythema Sinuses: No Frontal/maxillary tenderness ENT/Mouth: Ext aud canals clear, TMs without erythema, bulging. No erythema, swelling, or exudate on post pharynx.  Tonsils not swollen or erythematous. Hearing normal.  Neck: Supple, thyroid normal.  Respiratory: Respiratory effort normal, BS equal bilaterally without rales, rhonchi, wheezing or stridor.  Cardio: RRR with no MRGs. Brisk peripheral pulses without edema.  Abdomen:  Soft, + BS,  Non tender, no guarding, rebound, hernias, masses. Lymphatics: Non tender without lymphadenopathy.  Musculoskeletal: Full ROM, 5/5 strength, Normal gait Skin: Warm, dry without rashes, lesions, ecchymosis.  Neuro: Cranial nerves intact. Normal muscle tone, no cerebellar symptoms. Psych: Awake and oriented X 3, normal affect, Insight and Judgment appropriate.    Vicie Mutters, PA-C 2:52 PM [facility]

## 2016-04-23 NOTE — Patient Instructions (Addendum)
Try the melatonin 5mg -20mg  dissolvable or gummy 30 mins before bed Take the gabapentin 1-3 hours before you want to go to bed  Your ears and sinuses are connected by the eustachian tube. When your sinuses are inflamed, this can close off the tube and cause fluid to collect in your middle ear. This can then cause dizziness, popping, clicking, ringing, and echoing in your ears. This is often NOT an infection and does NOT require antibiotics, it is caused by inflammation so the treatments help the inflammation. This can take a long time to get better so please be patient.  Here are things you can do to help with this: - Try the Flonase or Nasonex. Remember to spray each nostril twice towards the outer part of your eye.  Do not sniff but instead pinch your nose and tilt your head back to help the medicine get into your sinuses.  The best time to do this is at bedtime.Stop if you get blurred vision or nose bleeds.  -While drinking fluids, pinch and hold nose close and swallow, to help open eustachian tubes to drain fluid behind ear drums. -Please pick one of the over the counter allergy medications below and take it once daily for allergies.  It will also help with fluid behind ear drums. Claritin or loratadine cheapest but likely the weakest  Zyrtec or certizine at night because it can make you sleepy The strongest is allegra or fexafinadine  Cheapest at [employer], [employer], [employer] -can use decongestant over the counter, please do not use if you have high blood pressure or certain heart conditions.   if worsening HA, changes vision/speech, imbalance, weakness go to the ER  Insomnia Insomnia is frequent trouble falling and/or staying asleep. Insomnia can be a long term problem or a short term problem. Both are common. Insomnia can be a short term problem when the wakefulness is related to a certain stress or worry. Long term insomnia is often related to ongoing stress during waking hours and/or poor sleeping  habits. Overtime, sleep deprivation itself can make the problem worse. Every little thing feels more severe because you are overtired and your ability to cope is decreased. CAUSES   Stress, anxiety, and depression.  Poor sleeping habits.  Distractions such as TV in the bedroom.  Naps close to bedtime.  Engaging in emotionally charged conversations before bed.  Technical reading before sleep.  Alcohol and other sedatives. They may make the problem worse. They can hurt normal sleep patterns and normal dream activity.  Stimulants such as caffeine for several hours prior to bedtime.  Pain syndromes and shortness of breath can cause insomnia.  Exercise late at night.  Changing time zones may cause sleeping problems (jet lag). It is sometimes helpful to have someone observe your sleeping patterns. They should look for periods of not breathing during the night (sleep apnea). They should also look to see how long those periods last. If you live alone or observers are uncertain, you can also be observed at a sleep clinic where your sleep patterns will be professionally monitored. Sleep apnea requires a checkup and treatment. Give your caregivers your medical history. Give your caregivers observations your family has made about your sleep.  SYMPTOMS   Not feeling rested in the morning.  Anxiety and restlessness at bedtime.  Difficulty falling and staying asleep. TREATMENT   Your caregiver may prescribe treatment for an underlying medical disorders. Your caregiver can give advice or help if you are using alcohol or other  drugs for self-medication. Treatment of underlying problems will usually eliminate insomnia problems.  Medications can be prescribed for short time use. They are generally not recommended for lengthy use.  Over-the-counter sleep medicines are not recommended for lengthy use. They can be habit forming.  You can promote easier sleeping by making lifestyle changes such  as:  Using relaxation techniques that help with breathing and reduce muscle tension.  Exercising earlier in the day.  Changing your diet and the time of your last meal. No night time snacks.  Establish a regular time to go to bed.  Counseling can help with stressful problems and worry.  Soothing music and white noise may be helpful if there are background noises you cannot remove.  Stop tedious detailed work at least one hour before bedtime. HOME CARE INSTRUCTIONS   Keep a diary. Inform your caregiver about your progress. This includes any medication side effects. See your caregiver regularly. Take note of:  Times when you are asleep.  Times when you are awake during the night.  The quality of your sleep.  How you feel the next day. This information will help your caregiver care for you.  Get out of bed if you are still awake after 15 minutes. Read or do some quiet activity. Keep the lights down. Wait until you feel sleepy and go back to bed.  Keep regular sleeping and waking hours. Avoid naps.  Exercise regularly.  Avoid distractions at bedtime. Distractions include watching television or engaging in any intense or detailed activity like attempting to balance the household checkbook.  Develop a bedtime ritual. Keep a familiar routine of bathing, brushing your teeth, climbing into bed at the same time each night, listening to soothing music. Routines increase the success of falling to sleep faster.  Use relaxation techniques. This can be using breathing and muscle tension release routines. It can also include visualizing peaceful scenes. You can also help control troubling or intruding thoughts by keeping your mind occupied with boring or repetitive thoughts like the old concept of counting sheep. You can make it more creative like imagining planting one beautiful flower after another in your backyard garden.  During your day, work to eliminate stress. When this is not  possible use some of the previous suggestions to help reduce the anxiety that accompanies stressful situations. MAKE SURE YOU:   Understand these instructions.  Will watch your condition.  Will get help right away if you are not doing well or get worse. Document Released: 05/10/2000 Document Revised: 08/05/2011 Document Reviewed: 06/10/2007 [facility] Patient Information 2015 [place], [place]. This information is not intended to replace advice given to you by your health care provider. Make sure you discuss any questions you have with your health care provider.

## 2016-04-24 LAB — BASIC METABOLIC PANEL WITH GFR
BUN: 14 mg/dL (ref 7–25)
CHLORIDE: 102 mmol/L (ref 98–110)
CO2: 28 mmol/L (ref 20–31)
CREATININE: 1.14 mg/dL — AB (ref 0.50–1.05)
Calcium: 9.1 mg/dL (ref 8.6–10.4)
GFR, Est African American: 64 mL/min (ref >=60)
GFR, Est Non African American: 56 mL/min — ABNORMAL LOW (ref >=60)
GLUCOSE: 92 mg/dL (ref 65–99)
Potassium: 4.2 mmol/L (ref 3.5–5.3)
Sodium: 139 mmol/L (ref 135–146)

## 2016-04-24 LAB — HEPATIC FUNCTION PANEL
ALT: 14 U/L (ref 6–29)
AST: 20 U/L (ref 10–35)
Albumin: 4 g/dL (ref 3.6–5.1)
Alkaline Phosphatase: 50 U/L (ref 33–130)
BILIRUBIN DIRECT: 0.1 mg/dL (ref <=0.2)
BILIRUBIN INDIRECT: 0.2 mg/dL (ref 0.2–1.2)
BILIRUBIN TOTAL: 0.3 mg/dL (ref 0.2–1.2)
Total Protein: 6.8 g/dL (ref 6.1–8.1)

## 2016-04-24 LAB — LIPID PANEL
CHOL/HDL RATIO: 2.1 ratio (ref <5.0)
CHOLESTEROL: 146 mg/dL (ref <200)
HDL: 68 mg/dL (ref >50)
LDL Cholesterol: 45 mg/dL (ref <100)
TRIGLYCERIDES: 165 mg/dL — AB (ref <150)
VLDL: 33 mg/dL — AB (ref <30)

## 2016-04-24 LAB — VITAMIN D 25 HYDROXY (VIT D DEFICIENCY, FRACTURES): Vit D, 25-Hydroxy: 39 ng/mL (ref 30–100)

## 2016-04-24 LAB — MAGNESIUM: Magnesium: 1.9 mg/dL (ref 1.5–2.5)

## 2016-05-28 ENCOUNTER — Other Ambulatory Visit: Payer: Self-pay | Admitting: *Deleted

## 2016-05-28 MED ORDER — ROSUVASTATIN CALCIUM 20 MG PO TABS: ORAL_TABLET | ORAL | 1 refills | Status: DC

## 2016-06-26 ENCOUNTER — Other Ambulatory Visit: Payer: Self-pay | Admitting: Internal Medicine

## 2016-06-26 DIAGNOSIS — G4700 Insomnia, unspecified: Secondary | ICD-10-CM

## 2016-06-26 NOTE — Telephone Encounter (Signed)
Ativan was called into pharmacy @ 10:18am

## 2016-07-09 DIAGNOSIS — H903 Sensorineural hearing loss, bilateral: Secondary | ICD-10-CM | POA: Diagnosis not present

## 2016-07-09 DIAGNOSIS — H6983 Other specified disorders of Eustachian tube, bilateral: Secondary | ICD-10-CM | POA: Diagnosis not present

## 2016-07-16 ENCOUNTER — Encounter: Payer: Self-pay | Admitting: Internal Medicine

## 2016-07-17 ENCOUNTER — Encounter: Payer: Self-pay | Admitting: Physician Assistant

## 2016-07-17 MED ORDER — ZOLPIDEM TARTRATE ER 12.5 MG PO TBCR: 12.5000 mg | EXTENDED_RELEASE_TABLET | Freq: Every evening | ORAL | 1 refills | Status: DC | PRN

## 2016-07-22 ENCOUNTER — Other Ambulatory Visit: Payer: Self-pay | Admitting: *Deleted

## 2016-07-22 DIAGNOSIS — G8929 Other chronic pain: Secondary | ICD-10-CM

## 2016-07-22 DIAGNOSIS — M545 Low back pain, unspecified: Secondary | ICD-10-CM

## 2016-07-22 MED ORDER — GABAPENTIN 100 MG PO CAPS: ORAL_CAPSULE | ORAL | 1 refills | Status: DC

## 2016-07-26 DIAGNOSIS — H6983 Other specified disorders of Eustachian tube, bilateral: Secondary | ICD-10-CM | POA: Diagnosis not present

## 2016-08-27 ENCOUNTER — Other Ambulatory Visit: Payer: Self-pay | Admitting: Internal Medicine

## 2016-09-18 ENCOUNTER — Encounter: Payer: Self-pay | Admitting: Internal Medicine

## 2016-09-18 ENCOUNTER — Ambulatory Visit (INDEPENDENT_AMBULATORY_CARE_PROVIDER_SITE_OTHER): Payer: BLUE CROSS/BLUE SHIELD | Admitting: Internal Medicine

## 2016-09-18 VITALS — BP 110/80 | HR 68 | Temp 97.3°F | Resp 16 | Ht 69.0 in | Wt 176.6 lb

## 2016-09-18 DIAGNOSIS — G4733 Obstructive sleep apnea (adult) (pediatric): Secondary | ICD-10-CM

## 2016-09-18 DIAGNOSIS — Z1212 Encounter for screening for malignant neoplasm of rectum: Secondary | ICD-10-CM

## 2016-09-18 DIAGNOSIS — Z0001 Encounter for general adult medical examination with abnormal findings: Secondary | ICD-10-CM

## 2016-09-18 DIAGNOSIS — Z111 Encounter for screening for respiratory tuberculosis: Secondary | ICD-10-CM | POA: Diagnosis not present

## 2016-09-18 DIAGNOSIS — K219 Gastro-esophageal reflux disease without esophagitis: Secondary | ICD-10-CM

## 2016-09-18 DIAGNOSIS — E559 Vitamin D deficiency, unspecified: Secondary | ICD-10-CM | POA: Diagnosis not present

## 2016-09-18 DIAGNOSIS — R7309 Other abnormal glucose: Secondary | ICD-10-CM

## 2016-09-18 DIAGNOSIS — Z79899 Other long term (current) drug therapy: Secondary | ICD-10-CM

## 2016-09-18 DIAGNOSIS — G8929 Other chronic pain: Secondary | ICD-10-CM

## 2016-09-18 DIAGNOSIS — Z0000 Encounter for general adult medical examination without abnormal findings: Secondary | ICD-10-CM | POA: Diagnosis not present

## 2016-09-18 DIAGNOSIS — I10 Essential (primary) hypertension: Secondary | ICD-10-CM

## 2016-09-18 DIAGNOSIS — Z780 Asymptomatic menopausal state: Secondary | ICD-10-CM

## 2016-09-18 DIAGNOSIS — Z136 Encounter for screening for cardiovascular disorders: Secondary | ICD-10-CM | POA: Diagnosis not present

## 2016-09-18 DIAGNOSIS — M549 Dorsalgia, unspecified: Secondary | ICD-10-CM

## 2016-09-18 DIAGNOSIS — R0989 Other specified symptoms and signs involving the circulatory and respiratory systems: Secondary | ICD-10-CM

## 2016-09-18 DIAGNOSIS — E782 Mixed hyperlipidemia: Secondary | ICD-10-CM

## 2016-09-18 DIAGNOSIS — R5383 Other fatigue: Secondary | ICD-10-CM

## 2016-09-18 LAB — HEPATIC FUNCTION PANEL
ALBUMIN: 4.1 g/dL (ref 3.6–5.1)
ALT: 59 U/L — ABNORMAL HIGH (ref 6–29)
AST: 48 U/L — ABNORMAL HIGH (ref 10–35)
Alkaline Phosphatase: 76 U/L (ref 33–130)
BILIRUBIN TOTAL: 0.3 mg/dL (ref 0.2–1.2)
Bilirubin, Direct: 0.1 mg/dL (ref <=0.2)
Indirect Bilirubin: 0.2 mg/dL (ref 0.2–1.2)
Total Protein: 6.8 g/dL (ref 6.1–8.1)

## 2016-09-18 LAB — TSH: TSH: 4.09 mIU/L

## 2016-09-18 LAB — IRON AND TIBC
%SAT: 11 % (ref 11–50)
Iron: 43 ug/dL — ABNORMAL LOW (ref 45–160)
TIBC: 393 ug/dL (ref 250–450)
UIBC: 350 ug/dL (ref 125–400)

## 2016-09-18 LAB — LIPID PANEL
Cholesterol: 169 mg/dL (ref <200)
HDL: 66 mg/dL (ref >50)
LDL Cholesterol: 74 mg/dL (ref <100)
TRIGLYCERIDES: 145 mg/dL (ref <150)
Total CHOL/HDL Ratio: 2.6 Ratio (ref <5.0)
VLDL: 29 mg/dL (ref <30)

## 2016-09-18 LAB — CBC WITH DIFFERENTIAL/PLATELET
BASOS PCT: 1 %
Basophils Absolute: 56 cells/uL (ref 0–200)
Eosinophils Absolute: 168 cells/uL (ref 15–500)
Eosinophils Relative: 3 %
HCT: 39.9 % (ref 35.0–45.0)
Hemoglobin: 13.2 g/dL (ref 11.7–15.5)
LYMPHS PCT: 34 %
Lymphs Abs: 1904 cells/uL (ref 850–3900)
MCH: 29.4 pg (ref 27.0–33.0)
MCHC: 33.1 g/dL (ref 32.0–36.0)
MCV: 88.9 fL (ref 80.0–100.0)
MONOS PCT: 7 %
MPV: 9.8 fL (ref 7.5–12.5)
Monocytes Absolute: 392 cells/uL (ref 200–950)
Neutro Abs: 3080 cells/uL (ref 1500–7800)
Neutrophils Relative %: 55 %
PLATELETS: 252 10*3/uL (ref 140–400)
RBC: 4.49 MIL/uL (ref 3.80–5.10)
RDW: 13.4 % (ref 11.0–15.0)
WBC: 5.6 10*3/uL (ref 3.8–10.8)

## 2016-09-18 LAB — BASIC METABOLIC PANEL WITH GFR
BUN: 15 mg/dL (ref 7–25)
CALCIUM: 9.3 mg/dL (ref 8.6–10.4)
CO2: 26 mmol/L (ref 20–31)
CREATININE: 1.36 mg/dL — AB (ref 0.50–1.05)
Chloride: 102 mmol/L (ref 98–110)
GFR, EST AFRICAN AMERICAN: 52 mL/min — AB (ref >=60)
GFR, Est Non African American: 45 mL/min — ABNORMAL LOW (ref >=60)
Glucose, Bld: 103 mg/dL — ABNORMAL HIGH (ref 65–99)
Potassium: 4.1 mmol/L (ref 3.5–5.3)
Sodium: 141 mmol/L (ref 135–146)

## 2016-09-18 LAB — MAGNESIUM: MAGNESIUM: 1.8 mg/dL (ref 1.5–2.5)

## 2016-09-18 MED ORDER — GABAPENTIN 600 MG PO TABS: ORAL_TABLET | ORAL | 1 refills | Status: DC

## 2016-09-18 NOTE — Progress Notes (Signed)
[facility] Unk Pinto, M.D.        Pamela Welch. Pamela Welch, P.A.-C [facility]                [address] [place], [place] [address] Telephone [phone] Telefax [phone]  Annual Screening/Preventative Visit & Comprehensive Evaluation &  Examination     This very nice 51 y.o. Single WF presents for a Screening/Preventative Visit & comprehensive evaluation and management of multiple medical co-morbidities.  Patient has been followed expectantly for labile HTN, Prediabetes, Hyperlipidemia and Vitamin D Deficiency.Patient also has hx/o chronic insomnia and also chronic low back pain with variable response impoving sleep on low dose Gabapentin. She also reports recent onset of night sweats and hot flashes.       Patient has been followed since 2004 with hx/o elevated BP. Patient's BP has been controlled at home and patient denies any cardiac symptoms as chest pain, palpitations, shortness of breath, dizziness or ankle swelling. Today's BP is at goal - 110/80.      Patient's hyperlipidemia is controlled with diet and medications. Patient denies myalgias or other medication SE's. Last lipids were at goal: Lab Results  Component Value Date   CHOL 146 04/23/2016   HDL 68 04/23/2016   LDLCALC 45 04/23/2016   TRIG 165 (H) 04/23/2016   CHOLHDL 2.1 04/23/2016      Patient has hx/o abnormal glucoses and hence is followed expectantly screening for  prediabetes and she denies reactive hypoglycemic symptoms, visual blurring, diabetic polys  or paresthesias. Last A1c was at goal: Lab Results  Component Value Date   HGBA1C 5.1 01/18/2016      Finally, patient has history of Vitamin D Deficiency ("36" in 2012)  and last Vitamin D was low (goal 70-100): Lab Results  Component Value Date   VD25OH 39 04/23/2016   Current Outpatient Prescriptions on File Prior to Visit  Medication Sig  . buPROPion (WELLBUTRIN XL)  300 MG 24 hr tablet TAKE 1 TABLET BY MOUTH ONCE EVERY MORNING FOR MOOD  . cyclobenzaprine (FLEXERIL) 10 MG tablet Take 1/2 to 1 tablet 3 x day if needed for muscle spasm  . EPIDUO 0.1-2.5 % gel PLACE A SMALL LAYER ON AFFECTED SKIN AT BEDTIME  . famotidine (PEPCID) 40 MG tablet TAKE 1 TABLET BY MOUTH AT BEDTIME  . fluticasone (FLONASE) 50 MCG/ACT nasal spray Place 2 sprays into both nostrils as needed. Reported on 06/27/2015  . gabapentin (NEURONTIN) 100 MG capsule TAKE 1 TO 2 CAPSULES BY MOUTH 3 TIMES DAILY AS NEEDED FOR BACK PAIN  . hydrocortisone-pramoxine (ANALPRAM HC) 2.5-1 % rectal cream Place 1 application rectally 3 (three) times daily.  Marland Kitchen ipratropium (ATROVENT) 0.06 % nasal spray Place 2 sprays into both nostrils 2 (two) times daily as needed. Reported on 06/27/2015  . LINZESS 145 MCG CAPS capsule TAKE 1 CAPSULE BY MOUTH DAILY  . LORazepam (ATIVAN) 2 MG tablet TAKE 1 TABLET AT BEDTIME  . meloxicam (MOBIC) 15 MG tablet Take 1/2 to 1 tablet daily with food for pain & inflammation  . montelukast (SINGULAIR) 10 MG tablet TAKE 1 TABLET BY MOUTH DAILY  . pantoprazole (PROTONIX) 40 MG tablet Take 1 tablet (40 mg total) by mouth 2 (two) times daily.  . rosuvastatin (CRESTOR) 20 MG tablet Take 1 tablet daily for cholesterol  . traZODone (DESYREL) 150 MG tablet TAKE 1-2 TABs 1 HOUR  BEFORE SLEEP  . valACYclovir (VALTREX) 500 MG tablet   . zolpidem (AMBIEN CR) 12.5 MG CR tablet Take 1 tablet (12.5 mg total) by mouth at bedtime as needed for sleep.   No current facility-administered medications on file prior to visit.    Allergies  Allergen Reactions  . Atenolol Other (See Comments)    Made patient feel like a "zombie"  . Ambien [Zolpidem Tartrate]     Memory issues  . Cholestatin     unknown  . Mirapex [Pramipexole Dihydrochloride]     Hot flashes  . Requip [Ropinirole Hcl]     Hot flashes   Past Medical History:  Diagnosis Date  . Allergy    SEASONAL  . Anxiety   . Back injury   .  Barrett's esophagus   . Chest pain, unspecified    normal myoview 2000;  echo 5/10:  mild LVH, EF 60-65%  . DDD (degenerative disc disease)   . Depression   . GERD (gastroesophageal reflux disease)   . Hiatal hernia   . HSV-1 (herpes simplex virus 1) infection   . Hyperlipidemia   . IBS (irritable bowel syndrome)   . Insomnia   . Lumbago   . Melanosis coli   . Palpitations    h/o symptomatic PVCs  . Palpitations   . Persistent disorder of initiating or maintaining sleep   . Restless legs syndrome (RLS)    Health Maintenance  Topic Date Due  . TETANUS/TDAP  05/28/2011  . INFLUENZA VACCINE  12/25/2016  . PAP SMEAR  09/14/2017  . MAMMOGRAM  09/19/2017  . COLONOSCOPY  04/15/2024  . HIV Screening  Completed   Immunization History  Administered Date(s) Administered  . DTaP 04/08/2012  . Influenza Split 04/15/2013, 04/27/2014  . Influenza, Seasonal, Injecte, Preservative Fre 04/23/2016  . Influenza-Unspecified 03/27/2012  . PPD Test 04/15/2013, 04/27/2014, 06/30/2015  . Pneumococcal-Unspecified 05/27/2009  . Td 05/27/2001   Past Surgical History:  Procedure Laterality Date  . COLONOSCOPY  2013  . ESOPHAGOGASTRODUODENOSCOPY N/A 03/04/2014   Procedure: ESOPHAGOGASTRODUODENOSCOPY (EGD);  Surgeon: Meryl Dare, MD;  Location: [facility];  Service: Endoscopy;  Laterality: N/A;  . EYE SURGERY  2001  . REFRACTIVE SURGERY     bilateral eyes   . TUBES IN EARS     as an adult   Family History  Problem Relation Age of Onset  . Hypertension Mother   . Osteoporosis Mother   . Breast cancer Sister 14    BRCA NEG/BILAT MASTECTOMY/CHEMO X 1 YR  . Hyperlipidemia Sister   . Ovarian cancer Sister   . Colon cancer Maternal Grandmother   . Stroke Maternal Grandmother   . Osteoporosis Maternal Grandmother   . Colon cancer Maternal Grandfather   . Multiple myeloma Paternal Grandmother   . Hypertension Father   . Hyperlipidemia Father   . Heart disease Cousin   . Lung cancer  Cousin    Social History  Substance Use Topics  . Smoking status: Never Smoker  . Smokeless tobacco: Never Used  . Alcohol use 0.0 oz/week     Comment: Social    ROS Constitutional: Denies fever, chills, weight loss/gain, headaches, insomnia,  night sweats, and change in appetite. Does c/o fatigue. Eyes: Denies redness, blurred vision, diplopia, discharge, itchy, watery eyes.  ENT: Denies discharge, congestion, post nasal drip, epistaxis, sore throat, earache, hearing loss, dental pain, Tinnitus, Vertigo, Sinus pain, snoring.  Cardio: Denies chest pain, palpitations, irregular heartbeat, syncope, dyspnea, diaphoresis, orthopnea, PND, claudication, edema Respiratory:  denies cough, dyspnea, DOE, pleurisy, hoarseness, laryngitis, wheezing.  Gastrointestinal: Denies dysphagia, heartburn, reflux, water brash, pain, cramps, nausea, vomiting, bloating, diarrhea, constipation, hematemesis, melena, hematochezia, jaundice, hemorrhoids Genitourinary: Denies dysuria, frequency, urgency, nocturia, hesitancy, discharge, hematuria, flank pain Breast: Breast lumps, nipple discharge, bleeding.  Musculoskeletal: Denies arthralgia, myalgia, stiffness, Jt. Swelling, pain, limp, and strain/sprain. Denies falls. Skin: Denies puritis, rash, hives, warts, acne, eczema, changing in skin lesion Neuro: No weakness, tremor, incoordination, spasms, paresthesia, pain Psychiatric: Denies confusion, memory loss, sensory loss. Denies Depression. Endocrine: Denies change in weight, skin, hair change, nocturia, and paresthesia, diabetic polys, visual blurring, hyper / hypo glycemic episodes.  Heme/Lymph: No excessive bleeding, bruising, enlarged lymph nodes.  Physical Exam  BP 110/80   Pulse 68   Temp 97.3 F (36.3 C)   Resp 16   Ht '5\' 9"'$  (1.753 m)   Wt 176 lb 9.6 oz (80.1 kg)   BMI 26.08 kg/m   General Appearance: Well nourished, well groomed and in no apparent distress.  Eyes: PERRLA, EOMs, conjunctiva no  swelling or erythema, normal fundi and vessels. Sinuses: No frontal/maxillary tenderness ENT/Mouth: EACs patent / TMs  nl. Nares clear without erythema, swelling, mucoid exudates. Oral hygiene is good. No erythema, swelling, or exudate. Tongue normal, non-obstructing. Tonsils not swollen or erythematous. Hearing normal.  Neck: Supple, thyroid normal. No bruits, nodes or JVD. Respiratory: Respiratory effort normal.  BS equal and clear bilateral without rales, rhonci, wheezing or stridor. Cardio: Heart sounds are normal with regular rate and rhythm and no murmurs, rubs or gallops. Peripheral pulses are normal and equal bilaterally without edema. No aortic or femoral bruits. Chest: symmetric with normal excursions and percussion. Breasts: Symmetric, without lumps, nipple discharge, retractions, or fibrocystic changes.  Abdomen: Flat, soft with bowel sounds active. Nontender, no guarding, rebound, hernias, masses, or organomegaly.  Lymphatics: Non tender without lymphadenopathy.  Genitourinary: Deferred to GYN Musculoskeletal: Full ROM all peripheral extremities, joint stability, 5/5 strength, and normal gait. Skin: Warm and dry without rashes, lesions, cyanosis, clubbing or  ecchymosis.  Neuro: Cranial nerves intact, reflexes equal bilaterally. Normal muscle tone, no cerebellar symptoms. Sensation intact.  Pysch: Alert and oriented X 3, normal affect, Insight and Judgment appropriate.   Assessment and Plan  1. Annual Preventative Screening Examination  2. Labile hypertension  - EKG 12-Lead - [place], RETROPERITNL ABD,  LTD - Urinalysis, Routine w reflex microscopic - Microalbumin / creatinine urine ratio - CBC with Differential/Platelet - BASIC METABOLIC PANEL WITH GFR - Magnesium - TSH  3. Hyperlipidemia, mixed  - EKG 12-Lead - [place], RETROPERITNL ABD,  LTD - Hepatic function panel - Lipid panel - TSH  4. Abnormal glucose  - Hemoglobin A1c - Insulin, random  5. Vitamin D  deficiency  - VITAMIN D 25 Hydroxy  6. Gastroesophageal reflux disease   7. OSA (obstructive sleep apnea)   8. Screening for ischemic heart disease  - EKG 12-Lead  9. Screening for AAA (aortic abdominal aneurysm)  - [place], RETROPERITNL ABD,  LTD  10. Screening for rectal cancer  - POC Hemoccult Bld/Stl   11. Fatigue, unspecified type  - Vitamin B12 - Iron and TIBC - CBC with Differential/Platelet - TSH  12. Medication management  - Urinalysis, Routine w reflex microscopic - Microalbumin / creatinine urine ratio - CBC with Differential/Platelet - BASIC METABOLIC PANEL WITH GFR - Hepatic function panel - Magnesium - Lipid panel - TSH - Hemoglobin A1c - Insulin, random - VITAMIN D 25 Hydroxy (Vit-D Deficiency, Fractures)  13. Menopause  -  FSH - Estradiol  14. Screening examination for pulmonary tuberculosis  - PPD       Patient was counseled in prudent diet to achieve/maintain BMI less than 25 for weight control, BP monitoring, regular exercise and medications. Discussed med's effects and SE's. Screening labs and tests as requested with regular follow-up as recommended. Over 40 minutes of exam, counseling, chart review and high complex critical decision making was performed.

## 2016-09-18 NOTE — Patient Instructions (Signed)

## 2016-09-19 LAB — URINALYSIS, MICROSCOPIC ONLY
BACTERIA UA: NONE SEEN [HPF]
CRYSTALS: NONE SEEN [HPF]
Casts: NONE SEEN [LPF]
RBC / HPF: NONE SEEN RBC/HPF (ref <=2)
Squamous Epithelial / LPF: NONE SEEN [HPF] (ref <=5)
WBC UA: NONE SEEN WBC/HPF (ref <=5)
Yeast: NONE SEEN [HPF]

## 2016-09-19 LAB — URINALYSIS, ROUTINE W REFLEX MICROSCOPIC
Bilirubin Urine: NEGATIVE
GLUCOSE, UA: NEGATIVE
HGB URINE DIPSTICK: NEGATIVE
KETONES UR: NEGATIVE
NITRITE: NEGATIVE
PH: 5 (ref 5.0–8.0)
PROTEIN: NEGATIVE
Specific Gravity, Urine: 1.021 (ref 1.001–1.035)

## 2016-09-19 LAB — INSULIN, RANDOM: INSULIN: 47 u[IU]/mL — AB (ref 2.0–19.6)

## 2016-09-19 LAB — HEMOGLOBIN A1C
Hgb A1c MFr Bld: 5.2 % (ref <5.7)
MEAN PLASMA GLUCOSE: 103 mg/dL

## 2016-09-19 LAB — MICROALBUMIN / CREATININE URINE RATIO
Creatinine, Urine: 202 mg/dL (ref 20–320)
MICROALB UR: 0.9 mg/dL
Microalb Creat Ratio: 4 mcg/mg creat (ref <30)

## 2016-09-19 LAB — VITAMIN D 25 HYDROXY (VIT D DEFICIENCY, FRACTURES): VIT D 25 HYDROXY: 47 ng/mL (ref 30–100)

## 2016-09-19 LAB — FOLLICLE STIMULATING HORMONE: FSH: 118.3 m[IU]/mL — ABNORMAL HIGH

## 2016-09-19 LAB — VITAMIN B12: Vitamin B-12: 526 pg/mL (ref 200–1100)

## 2016-09-19 LAB — ESTRADIOL

## 2016-09-24 DIAGNOSIS — Z803 Family history of malignant neoplasm of breast: Secondary | ICD-10-CM | POA: Diagnosis not present

## 2016-09-24 DIAGNOSIS — Z1231 Encounter for screening mammogram for malignant neoplasm of breast: Secondary | ICD-10-CM | POA: Diagnosis not present

## 2016-10-03 ENCOUNTER — Other Ambulatory Visit: Payer: Self-pay | Admitting: Internal Medicine

## 2016-10-09 ENCOUNTER — Encounter: Payer: Self-pay | Admitting: Gynecology

## 2016-10-17 ENCOUNTER — Encounter: Payer: Self-pay | Admitting: Internal Medicine

## 2016-11-04 DIAGNOSIS — M5030 Other cervical disc degeneration, unspecified cervical region: Secondary | ICD-10-CM | POA: Diagnosis not present

## 2016-11-04 DIAGNOSIS — M6751 Plica syndrome, right knee: Secondary | ICD-10-CM | POA: Diagnosis not present

## 2016-11-04 DIAGNOSIS — G5603 Carpal tunnel syndrome, bilateral upper limbs: Secondary | ICD-10-CM | POA: Diagnosis not present

## 2016-11-08 ENCOUNTER — Other Ambulatory Visit: Payer: Self-pay | Admitting: Internal Medicine

## 2016-11-13 DIAGNOSIS — D509 Iron deficiency anemia, unspecified: Secondary | ICD-10-CM | POA: Diagnosis not present

## 2016-12-03 ENCOUNTER — Other Ambulatory Visit: Payer: Self-pay | Admitting: *Deleted

## 2016-12-03 MED ORDER — BUPROPION HCL ER (XL) 300 MG PO TB24: ORAL_TABLET | ORAL | 1 refills | Status: DC

## 2016-12-16 ENCOUNTER — Encounter: Payer: BLUE CROSS/BLUE SHIELD | Admitting: Women's Health

## 2016-12-17 ENCOUNTER — Other Ambulatory Visit: Payer: Self-pay | Admitting: Internal Medicine

## 2017-01-07 ENCOUNTER — Ambulatory Visit: Payer: Self-pay | Admitting: Physician Assistant

## 2017-01-22 ENCOUNTER — Encounter: Payer: Self-pay | Admitting: Physician Assistant

## 2017-01-22 ENCOUNTER — Ambulatory Visit (INDEPENDENT_AMBULATORY_CARE_PROVIDER_SITE_OTHER): Payer: BLUE CROSS/BLUE SHIELD | Admitting: Physician Assistant

## 2017-01-22 VITALS — BP 118/78 | HR 75 | Temp 97.3°F | Resp 16 | Ht 69.0 in | Wt 172.6 lb

## 2017-01-22 DIAGNOSIS — E782 Mixed hyperlipidemia: Secondary | ICD-10-CM

## 2017-01-22 DIAGNOSIS — R0989 Other specified symptoms and signs involving the circulatory and respiratory systems: Secondary | ICD-10-CM | POA: Diagnosis not present

## 2017-01-22 DIAGNOSIS — Z79899 Other long term (current) drug therapy: Secondary | ICD-10-CM

## 2017-01-22 DIAGNOSIS — E559 Vitamin D deficiency, unspecified: Secondary | ICD-10-CM | POA: Diagnosis not present

## 2017-01-22 DIAGNOSIS — Z780 Asymptomatic menopausal state: Secondary | ICD-10-CM

## 2017-01-22 DIAGNOSIS — G4700 Insomnia, unspecified: Secondary | ICD-10-CM

## 2017-01-22 MED ORDER — ESCITALOPRAM OXALATE 10 MG PO TABS: 10.0000 mg | ORAL_TABLET | Freq: Every day | ORAL | 2 refills | Status: DC

## 2017-01-22 MED ORDER — LORAZEPAM 2 MG PO TABS: 2.0000 mg | ORAL_TABLET | Freq: Every day | ORAL | 0 refills | Status: DC

## 2017-01-22 NOTE — Progress Notes (Signed)
Assessment and Plan:  Hypertension -Continue medication, monitor blood pressure at home. Continue DASH diet.  Reminder to go to the ER if any CP, SOB, nausea, dizziness, severe HA, changes vision/speech, left arm numbness and tingling and jaw pain.  Cholesterol -Continue diet and exercise. Check cholesterol.    Prediabetes  -Continue diet and exercise. Check A1C   Vitamin D Def - check level and continue medications.   Menopause/depression Add on lexapro 10mg  for hot flashes, cut wellbutrin in half  Ear fullness ? Left tube out/misplaced but no signs of infection, follow up with ENT  Continue diet and meds as discussed. Further disposition pending results of labs. Over 30 minutes of exam, counseling, chart review, and critical decision making was performed  Future Appointments Date Time Provider [place]  04/15/2017 9:30 AM Unk Pinto, MD [facility] None  10/09/2017 9:00 AM Unk Pinto, MD [facility] None     HPI 51 y.o. female  presents for 3 month follow up on hypertension, cholesterol, prediabetes, and vitamin D deficiency.   Her blood pressure has been controlled at home, today their BP is BP: 118/78  She does workout. She denies chest pain, shortness of breath, dizziness. She had tubes in her ears last year, having issues with left ear feels fullness, can't hear well, sounds like underwater, with dizziness. No pain, no fever but has had chills last few days.   She is on cholesterol medication and denies myalgias. Her cholesterol is at goal. The cholesterol last visit was:   Lab Results  Component Value Date   CHOL 169 09/18/2016   HDL 66 09/18/2016   LDLCALC 74 09/18/2016   TRIG 145 09/18/2016   CHOLHDL 2.6 09/18/2016    She has been working on diet and exercise for prediabetes, and denies paresthesia of the feet, polydipsia, polyuria and visual disturbances. Last A1C in the office was:  Lab Results  Component Value Date   HGBA1C 5.2 09/18/2016    Patient is on Vitamin D supplement.   Lab Results  Component Value Date   VD25OH 47 09/18/2016     She tapered herself off lorazepam last year however she is having a lot of anxiety with her job. She is getting closer to a deadline, she will have anxiety with palpitations, SOB, hot flashes and decreased sleep. She will take trazodone and gabapentin x 1 week and one day take ambien CR one night that will help make gabapentin more effective.  BMI is Body mass index is 25.49 kg/m., she is working on diet and exercise. Wt Readings from Last 3 Encounters:  01/22/17 172 lb 9.6 oz (78.3 kg)  09/18/16 176 lb 9.6 oz (80.1 kg)  04/23/16 169 lb 3.2 oz (76.7 kg)    Current Medications:  Current Outpatient Prescriptions on File Prior to Visit  Medication Sig Dispense Refill  . buPROPion (WELLBUTRIN XL) 300 MG 24 hr tablet TAKE 1 TABLET BY MOUTH ONCE EVERY MORNING FOR MOOD 90 tablet 1  . cyclobenzaprine (FLEXERIL) 10 MG tablet Take 1/2 to 1 tablet 3 x day if needed for muscle spasm 90 tablet 0  . EPIDUO 0.1-2.5 % gel PLACE A SMALL LAYER ON AFFECTED SKIN AT BEDTIME  0  . famotidine (PEPCID) 40 MG tablet TAKE 1 TABLET BY MOUTH AT BEDTIME 90 tablet 1  . fluticasone (FLONASE) 50 MCG/ACT nasal spray Place 2 sprays into both nostrils as needed. Reported on 06/27/2015    . gabapentin (NEURONTIN) 100 MG capsule TAKE ONE TO TWO CAPSULES BY MOUTH  THREE TIMES DAILY AS NEEDED FOR BACK PAIN. GENERIC EQUIVALENT FOR NEURONTIN. 270 capsule 1  . gabapentin (NEURONTIN) 600 MG tablet Take 1/2 to 1 tablet 3 x/ day as directed for back pain and sleep 270 tablet 1  . hydrocortisone-pramoxine (ANALPRAM HC) 2.5-1 % rectal cream Place 1 application rectally 3 (three) times daily. 30 g 2  . ipratropium (ATROVENT) 0.06 % nasal spray Place 2 sprays into both nostrils 2 (two) times daily as needed. Reported on 06/27/2015    . LINZESS 145 MCG CAPS capsule TAKE 1 CAPSULE BY MOUTH DAILY 90 capsule 1  . LORazepam (ATIVAN) 2 MG tablet  TAKE 1 TABLET AT BEDTIME 90 tablet 0  . montelukast (SINGULAIR) 10 MG tablet TAKE 1 TABLET BY MOUTH DAILY 90 tablet 1  . pantoprazole (PROTONIX) 40 MG tablet Take 1 tablet (40 mg total) by mouth 2 (two) times daily. 180 tablet 3  . rosuvastatin (CRESTOR) 20 MG tablet TAKE 1 TABLET BY MOUTH DAILY FOR CHOLESTEROL GENERIC EQUIVALENT FOR CRESTOR 90 tablet 1  . traZODone (DESYREL) 150 MG tablet TAKE 1-2 TABs 1 HOUR BEFORE SLEEP 360 tablet 1  . valACYclovir (VALTREX) 500 MG tablet   2  . zolpidem (AMBIEN CR) 12.5 MG CR tablet Take 1 tablet (12.5 mg total) by mouth at bedtime as needed for sleep. 30 tablet 1   No current facility-administered medications on file prior to visit.    Medical History:  Past Medical History:  Diagnosis Date  . Allergy    SEASONAL  . Anxiety   . Back injury   . Barrett's esophagus   . Chest pain, unspecified    normal myoview 2000;  echo 5/10:  mild LVH, EF 60-65%  . DDD (degenerative disc disease)   . Depression   . GERD (gastroesophageal reflux disease)   . Hiatal hernia   . HSV-1 (herpes simplex virus 1) infection   . Hyperlipidemia   . IBS (irritable bowel syndrome)   . Insomnia   . Lumbago   . Melanosis coli   . Palpitations    h/o symptomatic PVCs  . Palpitations   . Persistent disorder of initiating or maintaining sleep   . Restless legs syndrome (RLS)    Allergies:  Allergies  Allergen Reactions  . Atenolol Other (See Comments)    Made patient feel like a "zombie"  . Ambien [Zolpidem Tartrate]     Memory issues  . Cholestatin     unknown  . Mirapex [Pramipexole Dihydrochloride]     Hot flashes  . Requip [Ropinirole Hcl]     Hot flashes     Review of Systems:  Review of Systems  Constitutional: Negative for chills, fever and weight loss.  HENT: Negative for congestion, nosebleeds, sore throat and tinnitus.   Eyes: Negative.   Respiratory: Negative for cough, shortness of breath and wheezing.   Cardiovascular: Negative for chest  pain, palpitations and leg swelling.  Gastrointestinal: Negative for abdominal pain, blood in stool, constipation, diarrhea, heartburn, melena, nausea and vomiting.  Genitourinary: Negative.   Musculoskeletal: Negative for back pain, joint pain, myalgias and neck pain.  Skin: Negative.   Neurological: Negative for dizziness, tingling, sensory change and headaches.  Psychiatric/Behavioral: Negative for depression, hallucinations, memory loss, substance abuse and suicidal ideas. The patient is not nervous/anxious and does not have insomnia.     Family history- Review and unchanged Social history- Review and unchanged Physical Exam: BP 118/78   Pulse 75   Temp (!) 97.3 F (36.3 C)  Resp 16   Ht 5\' 9"  (1.753 m)   Wt 172 lb 9.6 oz (78.3 kg)   SpO2 99%   BMI 25.49 kg/m  Wt Readings from Last 3 Encounters:  01/22/17 172 lb 9.6 oz (78.3 kg)  09/18/16 176 lb 9.6 oz (80.1 kg)  04/23/16 169 lb 3.2 oz (76.7 kg)   General Appearance: Well nourished, in no apparent distress. Eyes: PERRLA, EOMs, conjunctiva no swelling or erythema Sinuses: No Frontal/maxillary tenderness ENT/Mouth: Ext aud canals clear, TMs without erythema, bulging. No erythema, swelling, or exudate on post pharynx.  Tonsils not swollen or erythematous. Hearing normal.  Neck: Supple, thyroid normal.  Respiratory: Respiratory effort normal, BS equal bilaterally without rales, rhonchi, wheezing or stridor.  Cardio: RRR with no MRGs. Brisk peripheral pulses without edema.  Abdomen: Soft, + BS,  Non tender, no guarding, rebound, hernias, masses. Lymphatics: Non tender without lymphadenopathy.  Musculoskeletal: Full ROM, 5/5 strength, Normal gait Skin: Warm, dry without rashes, lesions, ecchymosis.  Neuro: Cranial nerves intact. Normal muscle tone, no cerebellar symptoms. Psych: Awake and oriented X 3, normal affect, Insight and Judgment appropriate.    Vicie Mutters, PA-C 10:14 AM [facility]

## 2017-01-22 NOTE — Patient Instructions (Addendum)
Add on lexapro 10mg  at night Cut wellbutrin in half 150XL  Continue gabapentin for now but would like to decrease or cut out.  Follow up ear doctor  11 Tips to Follow:  1. No caffeine after 3pm: Avoid beverages with caffeine (soda, tea, energy drinks, etc.) especially after 3pm. 2. Don't go to bed hungry: Have your evening meal at least 3 hrs. before going to sleep. It's fine to have a small bedtime snack such as a glass of milk and a few crackers but don't have a big meal. 3. Have a nightly routine before bed: Plan on "winding down" before you go to sleep. Begin relaxing about 1 hour before you go to bed. Try doing a quiet activity such as listening to calming music, reading a book or meditating. 4. Turn off the TV and ALL electronics including video games, tablets, laptops, etc. 1 hour before sleep, and keep them out of the bedroom. 5. Turn off your cell phone and all notifications (new email and text alerts) or even better, leave your phone outside your room while you sleep. Studies have shown that a part of your brain continues to respond to certain lights and sounds even while you're still asleep. 6. Make your bedroom quiet, dark and cool. If you can't control the noise, try wearing earplugs or using a fan to block out other sounds. 7. Practice relaxation techniques. Try reading a book or meditating or drain your brain by writing a list of what you need to do the next day. 8. Don't nap unless you feel sick: you'll have a better night's sleep. 9. Don't smoke, or quit if you do. Nicotine, alcohol, and marijuana can all keep you awake. Talk to your health care provider if you need help with substance use. 10. Most importantly, wake up at the same time every day (or within 1 hour of your usual wake up time) EVEN on the weekends. A regular wake up time promotes sleep hygiene and prevents sleep problems. 11. Reduce exposure to bright light in the last three hours of the day before going to  sleep. Maintaining good sleep hygiene and having good sleep habits lower your risk of developing sleep problems. Getting better sleep can also improve your concentration and alertness. Try the simple steps in this guide. If you still have trouble getting enough rest, make an appointment with your health care provider.

## 2017-01-23 ENCOUNTER — Other Ambulatory Visit: Payer: Self-pay | Admitting: Physician Assistant

## 2017-01-23 DIAGNOSIS — N289 Disorder of kidney and ureter, unspecified: Secondary | ICD-10-CM

## 2017-01-23 LAB — CBC WITH DIFFERENTIAL/PLATELET
BASOS ABS: 70 {cells}/uL (ref 0–200)
BASOS PCT: 1 %
EOS PCT: 2.4 %
Eosinophils Absolute: 168 cells/uL (ref 15–500)
HEMATOCRIT: 40.9 % (ref 35.0–45.0)
HEMOGLOBIN: 13.7 g/dL (ref 11.7–15.5)
LYMPHS ABS: 2331 {cells}/uL (ref 850–3900)
MCH: 28.8 pg (ref 27.0–33.0)
MCHC: 33.5 g/dL (ref 32.0–36.0)
MCV: 85.9 fL (ref 80.0–100.0)
MONOS PCT: 6.5 %
MPV: 10.6 fL (ref 7.5–12.5)
NEUTROS ABS: 3976 {cells}/uL (ref 1500–7800)
Neutrophils Relative %: 56.8 %
Platelets: 300 10*3/uL (ref 140–400)
RBC: 4.76 10*6/uL (ref 3.80–5.10)
RDW: 12.8 % (ref 11.0–15.0)
Total Lymphocyte: 33.3 %
WBC mixed population: 455 cells/uL (ref 200–950)
WBC: 7 10*3/uL (ref 3.8–10.8)

## 2017-01-23 LAB — BASIC METABOLIC PANEL WITH GFR
BUN / CREAT RATIO: 11 (calc) (ref 6–22)
BUN: 14 mg/dL (ref 7–25)
CO2: 27 mmol/L (ref 20–32)
Calcium: 9.6 mg/dL (ref 8.6–10.4)
Chloride: 102 mmol/L (ref 98–110)
Creat: 1.32 mg/dL — ABNORMAL HIGH (ref 0.50–1.05)
GFR, EST NON AFRICAN AMERICAN: 46 mL/min/{1.73_m2} — AB (ref >=60)
GFR, Est African American: 54 mL/min/{1.73_m2} — ABNORMAL LOW (ref >=60)
Glucose, Bld: 75 mg/dL (ref 65–99)
POTASSIUM: 4.3 mmol/L (ref 3.5–5.3)
SODIUM: 139 mmol/L (ref 135–146)

## 2017-01-23 LAB — LIPID PANEL
CHOL/HDL RATIO: 2.2 (calc) (ref <5.0)
CHOLESTEROL: 194 mg/dL (ref <200)
HDL: 90 mg/dL (ref >50)
LDL CHOLESTEROL (CALC): 80 mg/dL
NON-HDL CHOLESTEROL (CALC): 104 mg/dL (ref <130)
Triglycerides: 141 mg/dL (ref <150)

## 2017-01-23 LAB — HEPATIC FUNCTION PANEL
AG RATIO: 1.6 (calc) (ref 1.0–2.5)
ALBUMIN MSPROF: 4.5 g/dL (ref 3.6–5.1)
ALT: 14 U/L (ref 6–29)
AST: 20 U/L (ref 10–35)
Alkaline phosphatase (APISO): 79 U/L (ref 33–130)
BILIRUBIN INDIRECT: 0.4 mg/dL (ref 0.2–1.2)
Bilirubin, Direct: 0.1 mg/dL (ref 0.0–0.2)
GLOBULIN: 2.9 g/dL (ref 1.9–3.7)
TOTAL PROTEIN: 7.4 g/dL (ref 6.1–8.1)
Total Bilirubin: 0.5 mg/dL (ref 0.2–1.2)

## 2017-01-23 LAB — MAGNESIUM: Magnesium: 1.8 mg/dL (ref 1.5–2.5)

## 2017-01-23 LAB — TSH: TSH: 2.66 m[IU]/L

## 2017-01-28 DIAGNOSIS — J310 Chronic rhinitis: Secondary | ICD-10-CM | POA: Diagnosis not present

## 2017-01-28 DIAGNOSIS — H7203 Central perforation of tympanic membrane, bilateral: Secondary | ICD-10-CM | POA: Diagnosis not present

## 2017-01-28 DIAGNOSIS — H6122 Impacted cerumen, left ear: Secondary | ICD-10-CM | POA: Diagnosis not present

## 2017-01-28 DIAGNOSIS — J343 Hypertrophy of nasal turbinates: Secondary | ICD-10-CM | POA: Diagnosis not present

## 2017-01-28 DIAGNOSIS — J342 Deviated nasal septum: Secondary | ICD-10-CM | POA: Diagnosis not present

## 2017-02-08 ENCOUNTER — Other Ambulatory Visit: Payer: Self-pay | Admitting: Internal Medicine

## 2017-02-08 DIAGNOSIS — G4700 Insomnia, unspecified: Secondary | ICD-10-CM

## 2017-02-20 ENCOUNTER — Other Ambulatory Visit: Payer: BLUE CROSS/BLUE SHIELD

## 2017-02-20 DIAGNOSIS — N289 Disorder of kidney and ureter, unspecified: Secondary | ICD-10-CM

## 2017-02-21 LAB — BASIC METABOLIC PANEL WITH GFR
BUN/Creatinine Ratio: 14 (calc) (ref 6–22)
BUN: 18 mg/dL (ref 7–25)
CALCIUM: 8.5 mg/dL — AB (ref 8.6–10.4)
CO2: 27 mmol/L (ref 20–32)
CREATININE: 1.29 mg/dL — AB (ref 0.50–1.05)
Chloride: 103 mmol/L (ref 98–110)
GFR, EST NON AFRICAN AMERICAN: 48 mL/min/{1.73_m2} — AB (ref >=60)
GFR, Est African American: 55 mL/min/{1.73_m2} — ABNORMAL LOW (ref >=60)
Glucose, Bld: 73 mg/dL (ref 65–99)
POTASSIUM: 4.2 mmol/L (ref 3.5–5.3)
Sodium: 140 mmol/L (ref 135–146)

## 2017-02-27 ENCOUNTER — Other Ambulatory Visit: Payer: Self-pay | Admitting: Internal Medicine

## 2017-02-27 ENCOUNTER — Encounter: Payer: Self-pay | Admitting: Physician Assistant

## 2017-03-07 ENCOUNTER — Other Ambulatory Visit: Payer: Self-pay | Admitting: Internal Medicine

## 2017-03-09 ENCOUNTER — Other Ambulatory Visit: Payer: Self-pay | Admitting: Internal Medicine

## 2017-03-09 MED ORDER — ESCITALOPRAM OXALATE 10 MG PO TABS: ORAL_TABLET | ORAL | 1 refills | Status: DC

## 2017-03-17 ENCOUNTER — Other Ambulatory Visit: Payer: Self-pay | Admitting: Internal Medicine

## 2017-03-17 DIAGNOSIS — F329 Major depressive disorder, single episode, unspecified: Secondary | ICD-10-CM

## 2017-03-17 DIAGNOSIS — F32A Depression, unspecified: Secondary | ICD-10-CM

## 2017-03-17 MED ORDER — ESCITALOPRAM OXALATE 10 MG PO TABS: ORAL_TABLET | ORAL | 3 refills | Status: DC

## 2017-04-05 ENCOUNTER — Other Ambulatory Visit: Payer: Self-pay | Admitting: Internal Medicine

## 2017-04-15 ENCOUNTER — Ambulatory Visit: Payer: Self-pay | Admitting: Internal Medicine

## 2017-04-24 ENCOUNTER — Other Ambulatory Visit: Payer: Self-pay | Admitting: Internal Medicine

## 2017-05-02 ENCOUNTER — Encounter: Payer: Self-pay | Admitting: Physician Assistant

## 2017-05-02 ENCOUNTER — Other Ambulatory Visit: Payer: Self-pay | Admitting: Internal Medicine

## 2017-05-02 MED ORDER — ACYCLOVIR 800 MG PO TABS: ORAL_TABLET | ORAL | 1 refills | Status: AC

## 2017-05-03 ENCOUNTER — Other Ambulatory Visit: Payer: Self-pay | Admitting: Physician Assistant

## 2017-05-03 DIAGNOSIS — G4700 Insomnia, unspecified: Secondary | ICD-10-CM

## 2017-05-06 ENCOUNTER — Ambulatory Visit: Payer: Self-pay | Admitting: Physician Assistant

## 2017-05-09 DIAGNOSIS — F4323 Adjustment disorder with mixed anxiety and depressed mood: Secondary | ICD-10-CM | POA: Diagnosis not present

## 2017-05-15 DIAGNOSIS — F4323 Adjustment disorder with mixed anxiety and depressed mood: Secondary | ICD-10-CM | POA: Diagnosis not present

## 2017-06-19 ENCOUNTER — Other Ambulatory Visit: Payer: Self-pay | Admitting: Internal Medicine

## 2017-07-10 ENCOUNTER — Other Ambulatory Visit: Payer: Self-pay | Admitting: Internal Medicine

## 2017-07-17 ENCOUNTER — Encounter: Payer: Self-pay | Admitting: Physician Assistant

## 2017-07-17 DIAGNOSIS — F32A Depression, unspecified: Secondary | ICD-10-CM

## 2017-07-17 DIAGNOSIS — F329 Major depressive disorder, single episode, unspecified: Secondary | ICD-10-CM

## 2017-07-17 MED ORDER — BENZONATATE 100 MG PO CAPS: 100.0000 mg | ORAL_CAPSULE | Freq: Three times a day (TID) | ORAL | 0 refills | Status: DC | PRN

## 2017-07-17 MED ORDER — ESCITALOPRAM OXALATE 10 MG PO TABS: ORAL_TABLET | ORAL | 3 refills | Status: DC

## 2017-08-21 ENCOUNTER — Other Ambulatory Visit: Payer: Self-pay | Admitting: Physician Assistant

## 2017-08-21 MED ORDER — ZOLPIDEM TARTRATE ER 12.5 MG PO TBCR: 12.5000 mg | EXTENDED_RELEASE_TABLET | Freq: Every evening | ORAL | 1 refills | Status: DC | PRN

## 2017-09-28 ENCOUNTER — Encounter: Payer: Self-pay | Admitting: Medical

## 2017-09-30 ENCOUNTER — Other Ambulatory Visit: Payer: Self-pay | Admitting: Physician Assistant

## 2017-10-09 ENCOUNTER — Encounter: Payer: Self-pay | Admitting: Internal Medicine

## 2017-10-10 ENCOUNTER — Other Ambulatory Visit: Payer: Self-pay | Admitting: Internal Medicine

## 2017-10-16 ENCOUNTER — Other Ambulatory Visit: Payer: Self-pay | Admitting: Internal Medicine

## 2017-10-16 DIAGNOSIS — G4700 Insomnia, unspecified: Secondary | ICD-10-CM

## 2017-11-13 NOTE — Progress Notes (Addendum)
[facility] Unk Pinto, M.D.     Uvaldo Bristle. Silverio Lay, P.A.-C Liane Comber, [place] [address] [place], [place] [address] Telephone [phone] Telefax [phone] Annual Screening/Preventative Visit & Comprehensive Evaluation &  Examination     This very nice 52 y.o. single WF presents for a Screening/Preventative Visit & comprehensive evaluation and management of multiple medical co-morbidities.  Patient is  followed expectantly with labile HTN, HLD, Prediabetes  and Vitamin D Deficiency.   Other problems include hx/o chronic insomnia and also chronic low back pain with variable response improving sleep on low dose Gabapentin. She also has GERD controlled on current meds.      Labile HTN predates since 2004. Patient's BP has been controlled at home and patient denies any cardiac symptoms as chest pain, palpitations, shortness of breath, dizziness or ankle swelling. Today's BP is at goal - 112/80.      Patient's hyperlipidemia is controlled with diet and medications. Patient denies myalgias or other medication SE's. Last lipids were at goal: Lab Results  Component Value Date   CHOL 194 01/22/2017   HDL 90 01/22/2017   LDLCALC 80 01/22/2017   TRIG 141 01/22/2017   CHOLHDL 2.2 01/22/2017      Patient has hx/o abnormal glucose(s) and is followed expectantly for prediabetes predating  and patient denies reactive hypoglycemic symptoms, visual blurring, diabetic polys, or paresthesias. Last A1c was Normal & at goal: Lab Results  Component Value Date   HGBA1C 5.2 09/18/2016      Finally, patient has history of Vitamin D Deficiency ("36" /2012) and last Vitamin D was still low: Lab Results  Component Value Date   VD25OH 47 09/18/2016   Current Outpatient Medications on File Prior to Visit  Medication Sig  . buPROPion (WELLBUTRIN XL) 300 MG 24 hr tablet TAKE 1 TABLET BY MOUTH EVERY MORNING FOR MOOD  .  escitalopram (LEXAPRO) 10 MG tablet Take 1 tablet daily for Mood  . famotidine (PEPCID) 40 MG tablet TAKE 1 TABLET BY MOUTH AT BEDTIME  . fluticasone (FLONASE) 50 MCG/ACT nasal spray Place 2 sprays into both nostrils as needed. Reported on 06/27/2015  . gabapentin (NEURONTIN) 100 MG capsule TAKE ONE TO TWO CAPSULES BY MOUTH THREE TIMES DAILY AS NEEDED FOR BACK PAIN. GENERIC EQUIVALENT FOR NEURONTIN.  Marland Kitchen LINZESS 145 MCG CAPS capsule TAKE ONE CAPSULE BY MOUTH DAILY  . LORazepam (ATIVAN) 2 MG tablet Take 1 tablet (2 mg total) by mouth at bedtime.  . montelukast (SINGULAIR) 10 MG tablet TAKE 1 TABLET BY MOUTH DAILY  . pantoprazole (PROTONIX) 40 MG tablet Take 1 tablet (40 mg total) by mouth 2 (two) times daily.  . rosuvastatin (CRESTOR) 20 MG tablet TAKE 1 TABLET BY MOUTH DAILY FOR CHOLESTEROL GENERIC EQUIVALENT FOR CRESTOR  . traZODone (DESYREL) 150 MG tablet TAKE ONE TO TWO TABLETS BY MOUTH ONE HOUR BEFORE SLEEP  . valACYclovir (VALTREX) 500 MG tablet   . zolpidem (AMBIEN CR) 12.5 MG CR tablet Take 1 tablet (12.5 mg total) by mouth at bedtime as needed for sleep.  Marland Kitchen gabapentin (NEURONTIN) 600 MG tablet Take 1/2 to 1 tablet 3 x/ day as directed for back pain and sleep   No current facility-administered medications on file prior to visit.    Allergies  Allergen Reactions  . Atenolol Other (See Comments)    Made patient feel like a "zombie"  . Ambien [Zolpidem Tartrate]     Memory issues  . Cholestatin  unknown  . Mirapex [Pramipexole Dihydrochloride]     Hot flashes  . Requip [Ropinirole Hcl]     Hot flashes   Past Medical History:  Diagnosis Date  . Allergy    SEASONAL  . Anxiety   . Back injury   . Barrett's esophagus   . Chest pain, unspecified    normal myoview 2000;  echo 5/10:  mild LVH, EF 60-65%  . DDD (degenerative disc disease)   . Depression   . GERD (gastroesophageal reflux disease)   . Hiatal hernia   . HSV-1 (herpes simplex virus 1) infection   . Hyperlipidemia    . IBS (irritable bowel syndrome)   . Insomnia   . Lumbago   . Melanosis coli   . Palpitations    h/o symptomatic PVCs  . Palpitations   . Persistent disorder of initiating or maintaining sleep   . Restless legs syndrome (RLS)    Health Maintenance  Topic Date Due  . PAP SMEAR  09/14/2017  . INFLUENZA VACCINE  12/25/2017  . MAMMOGRAM  09/25/2018  . TETANUS/TDAP  04/08/2022  . COLONOSCOPY  04/15/2024  . HIV Screening  Completed   Immunization History  Administered Date(s) Administered  . Influenza Split 04/15/2013, 04/27/2014  . Influenza, Seasonal, Injecte, Preservative Fre 04/23/2016  . Influenza-Unspecified 03/27/2012  . PPD Test 04/15/2013, 04/27/2014, 06/30/2015, 09/18/2016  . Pneumococcal-Unspecified 05/27/2009  . Td 05/27/2001  . Tdap 04/08/2012   Last Colon - 04/20/2014 - Dr  Fuller Plan - recc 10 yr f/u due Nov 2025.  Last EGD - 04/20/2014 - Dr Fuller Plan (+) Barrett's - recc 2 yr f/u in Dec 2017 - Over due -   Last MGM - 09/24/2016 - [facility] recc annual f/u  Past Surgical History:  Procedure Laterality Date  . COLONOSCOPY  2013  . ESOPHAGOGASTRODUODENOSCOPY N/A 03/04/2014   Procedure: ESOPHAGOGASTRODUODENOSCOPY (EGD);  Surgeon: Ladene Artist, MD;  Location: [facility];  Service: Endoscopy;  Laterality: N/A;  . EYE SURGERY  2001  . REFRACTIVE SURGERY     bilateral eyes   . TUBES IN EARS     as an adult   Family History  Problem Relation Age of Onset  . Hypertension Mother   . Osteoporosis Mother   . Breast cancer Sister 49       BRCA NEG/BILAT MASTECTOMY/CHEMO X 1 YR  . Hyperlipidemia Sister   . Ovarian cancer Sister   . Colon cancer Maternal Grandmother   . Stroke Maternal Grandmother   . Osteoporosis Maternal Grandmother   . Colon cancer Maternal Grandfather   . Multiple myeloma Paternal Grandmother   . Hypertension Father   . Hyperlipidemia Father   . Heart disease Cousin   . Lung cancer Cousin    Social History   Tobacco Use  . Smoking status: Never  Smoker  . Smokeless tobacco: Never Used  Substance Use Topics  . Alcohol use: Yes    Alcohol/week: 0.0 oz    Comment: Social  . Drug use: No    ROS Constitutional: Denies fever, chills, weight loss/gain, headaches, insomnia,  night sweats, and change in appetite. Does c/o fatigue. Eyes: Denies redness, blurred vision, diplopia, discharge, itchy, watery eyes.  ENT: Denies discharge, congestion, post nasal drip, epistaxis, sore throat, earache, hearing loss, dental pain, Tinnitus, Vertigo, Sinus pain, snoring.  Cardio: Denies chest pain, palpitations, irregular heartbeat, syncope, dyspnea, diaphoresis, orthopnea, PND, claudication, edema Respiratory: denies cough, dyspnea, DOE, pleurisy, hoarseness, laryngitis, wheezing.  Gastrointestinal: Denies dysphagia, heartburn, reflux, water brash, pain,  cramps, nausea, vomiting, bloating, diarrhea, constipation, hematemesis, melena, hematochezia, jaundice, hemorrhoids Genitourinary: Denies dysuria, frequency, urgency, nocturia, hesitancy, discharge, hematuria, flank pain Breast: Breast lumps, nipple discharge, bleeding.  Musculoskeletal: Denies arthralgia, myalgia, stiffness, Jt. Swelling, pain, limp, and strain/sprain. Denies falls. Skin: Denies puritis, rash, hives, warts, acne, eczema, changing in skin lesion Neuro: No weakness, tremor, incoordination, spasms, paresthesia, pain Psychiatric: Denies confusion, memory loss, sensory loss. Denies Depression. Endocrine: Denies change in weight, skin, hair change, nocturia, and paresthesia, diabetic polys, visual blurring, hyper / hypo glycemic episodes.  Heme/Lymph: No excessive bleeding, bruising, enlarged lymph nodes.  Physical Exam  BP 112/80   Pulse 72   Temp (!) 97.3 F (36.3 C)   Resp 16   Ht 5' 9.25" (1.759 m)   Wt 183 lb 3.2 oz (83.1 kg)   BMI 26.86 kg/m   General Appearance: Well nourished, well groomed and in no apparent distress.  Eyes: PERRLA, EOMs, conjunctiva no swelling or  erythema, normal fundi and vessels. Sinuses: No frontal/maxillary tenderness ENT/Mouth: EACs patent / TMs  nl. Nares clear without erythema, swelling, mucoid exudates. Oral hygiene is good. No erythema, swelling, or exudate. Tongue normal, non-obstructing. Tonsils not swollen or erythematous. Hearing normal.  Neck: Supple, thyroid not palpable. No bruits, nodes or JVD. Respiratory: Respiratory effort normal.  BS equal and clear bilateral without rales, rhonci, wheezing or stridor. Cardio: Heart sounds are normal with regular rate and rhythm and no murmurs, rubs or gallops. Peripheral pulses are normal and equal bilaterally without edema. No aortic or femoral bruits. Chest: symmetric with normal excursions and percussion. Breasts: Symmetric, without lumps, nipple discharge, retractions, or fibrocystic changes.  Abdomen: Flat, soft with bowel sounds active. Nontender, no guarding, rebound, hernias, masses, or organomegaly.  Lymphatics: Non tender without lymphadenopathy.  Musculoskeletal: Full ROM all peripheral extremities, joint stability, 5/5 strength, and normal gait. Skin: Warm and dry without rashes, lesions, cyanosis, clubbing or  ecchymosis.  Neuro: Cranial nerves intact, reflexes equal bilaterally. Normal muscle tone, no cerebellar symptoms. Sensation intact.  Pysch: Alert and oriented X 3, normal affect, Insight and Judgment appropriate.   Assessment and Plan  1. Annual Preventative Screening Examination  2. Labile hypertension  - EKG 12-Lead - [place], RETROPERITNL ABD,  LTD - Urinalysis, Routine w reflex microscopic - Microalbumin / creatinine urine ratio - CBC with Differential/Platelet - COMPLETE METABOLIC PANEL WITH GFR - Magnesium - TSH  3. Hyperlipidemia, mixed  - EKG 12-Lead - [place], RETROPERITNL ABD,  LTD - Lipid panel - TSH  4. Abnormal glucose  - EKG 12-Lead - [place], RETROPERITNL ABD,  LTD - Hemoglobin A1c - Insulin, random  5. Vitamin D deficiency  - VITAMIN D  25 Hydroxy (Vit-D Deficiency, Fractures)  6. CKD Stage 2 (GFR = 64 ml/min)   - Urinalysis, Routine w reflex microscopic - Microalbumin / creatinine urine ratio  7. Gastroesophageal reflux disease, esophagitis presence not specified  - CBC with Differential/Platelet - Ambulatory referral to Gastroenterology  8. Barrett's esophagus with dysplasia  - Ambulatory referral to Gastroenterology  9. Screening for ischemic heart disease  - EKG 12-Lead - Lipid panel  10. FHx: heart disease  - EKG 12-Lead - [place], RETROPERITNL ABD,  LTD  11. Screening for AAA (aortic abdominal aneurysm)  - [place], RETROPERITNL ABD,  LTD  12. Screening for colorectal cancer  - POC Hemoccult Bld/Stl (3-Cd Home Screen); Future  13. Screening examination for pulmonary tuberculosis  - PPD  14. Chronic midline low back pain without sciatica  - gabapentin (NEURONTIN) 600  MG tablet; Take 1/2 to 1 tablet 3 to 4 x/ day as directed for back pain and sleep  Dispense: 360 tablet; Refill: 1  15. Insomnia   16. Need for MMR vaccine  - Measles/Mumps/Rubella Immunity  17. Fatigue  - Iron,Total/Total Iron Binding Cap - Vitamin B12 - CBC with Differential/Platelet  18. Medication management  - Urinalysis, Routine w reflex microscopic - Microalbumin / creatinine urine ratio - CBC with Differential/Platelet           Patient was counseled in prudent diet to achieve/maintain BMI less than 25 for weight control, BP monitoring, regular exercise and medications. Discussed med's effects and SE's. Screening labs and tests as requested with regular follow-up as recommended. Over 40 minutes of exam, counseling, chart review and high complex critical decision making was performed.

## 2017-11-13 NOTE — Patient Instructions (Signed)

## 2017-11-14 ENCOUNTER — Ambulatory Visit: Payer: BLUE CROSS/BLUE SHIELD | Admitting: Internal Medicine

## 2017-11-14 ENCOUNTER — Encounter: Payer: Self-pay | Admitting: Internal Medicine

## 2017-11-14 VITALS — BP 112/80 | HR 72 | Temp 97.3°F | Resp 16 | Ht 69.25 in | Wt 183.2 lb

## 2017-11-14 DIAGNOSIS — Z1212 Encounter for screening for malignant neoplasm of rectum: Secondary | ICD-10-CM

## 2017-11-14 DIAGNOSIS — E559 Vitamin D deficiency, unspecified: Secondary | ICD-10-CM | POA: Diagnosis not present

## 2017-11-14 DIAGNOSIS — M545 Low back pain, unspecified: Secondary | ICD-10-CM

## 2017-11-14 DIAGNOSIS — R0989 Other specified symptoms and signs involving the circulatory and respiratory systems: Secondary | ICD-10-CM

## 2017-11-14 DIAGNOSIS — Z111 Encounter for screening for respiratory tuberculosis: Secondary | ICD-10-CM

## 2017-11-14 DIAGNOSIS — E782 Mixed hyperlipidemia: Secondary | ICD-10-CM

## 2017-11-14 DIAGNOSIS — R7309 Other abnormal glucose: Secondary | ICD-10-CM

## 2017-11-14 DIAGNOSIS — Z79899 Other long term (current) drug therapy: Secondary | ICD-10-CM | POA: Diagnosis not present

## 2017-11-14 DIAGNOSIS — Z136 Encounter for screening for cardiovascular disorders: Secondary | ICD-10-CM | POA: Diagnosis not present

## 2017-11-14 DIAGNOSIS — Z0001 Encounter for general adult medical examination with abnormal findings: Secondary | ICD-10-CM

## 2017-11-14 DIAGNOSIS — N182 Chronic kidney disease, stage 2 (mild): Secondary | ICD-10-CM

## 2017-11-14 DIAGNOSIS — G4700 Insomnia, unspecified: Secondary | ICD-10-CM

## 2017-11-14 DIAGNOSIS — Z8249 Family history of ischemic heart disease and other diseases of the circulatory system: Secondary | ICD-10-CM

## 2017-11-14 DIAGNOSIS — R5383 Other fatigue: Secondary | ICD-10-CM | POA: Diagnosis not present

## 2017-11-14 DIAGNOSIS — K22719 Barrett's esophagus with dysplasia, unspecified: Secondary | ICD-10-CM

## 2017-11-14 DIAGNOSIS — G8929 Other chronic pain: Secondary | ICD-10-CM

## 2017-11-14 DIAGNOSIS — Z23 Encounter for immunization: Secondary | ICD-10-CM

## 2017-11-14 DIAGNOSIS — K219 Gastro-esophageal reflux disease without esophagitis: Secondary | ICD-10-CM

## 2017-11-14 DIAGNOSIS — Z1211 Encounter for screening for malignant neoplasm of colon: Secondary | ICD-10-CM

## 2017-11-14 MED ORDER — GABAPENTIN 600 MG PO TABS: ORAL_TABLET | ORAL | 1 refills | Status: DC

## 2017-11-15 ENCOUNTER — Other Ambulatory Visit: Payer: Self-pay | Admitting: Internal Medicine

## 2017-11-16 ENCOUNTER — Encounter: Payer: Self-pay | Admitting: Internal Medicine

## 2017-11-17 LAB — CBC WITH DIFFERENTIAL/PLATELET
BASOS ABS: 49 {cells}/uL (ref 0–200)
Basophils Relative: 0.7 %
Eosinophils Absolute: 126 cells/uL (ref 15–500)
Eosinophils Relative: 1.8 %
HCT: 39.7 % (ref 35.0–45.0)
Hemoglobin: 13.5 g/dL (ref 11.7–15.5)
Lymphs Abs: 2331 cells/uL (ref 850–3900)
MCH: 29.4 pg (ref 27.0–33.0)
MCHC: 34 g/dL (ref 32.0–36.0)
MCV: 86.5 fL (ref 80.0–100.0)
MONOS PCT: 6.1 %
MPV: 10.3 fL (ref 7.5–12.5)
NEUTROS PCT: 58.1 %
Neutro Abs: 4067 cells/uL (ref 1500–7800)
PLATELETS: 287 10*3/uL (ref 140–400)
RBC: 4.59 10*6/uL (ref 3.80–5.10)
RDW: 12.4 % (ref 11.0–15.0)
TOTAL LYMPHOCYTE: 33.3 %
WBC mixed population: 427 cells/uL (ref 200–950)
WBC: 7 10*3/uL (ref 3.8–10.8)

## 2017-11-17 LAB — URINALYSIS, ROUTINE W REFLEX MICROSCOPIC
BILIRUBIN URINE: NEGATIVE
GLUCOSE, UA: NEGATIVE
Hgb urine dipstick: NEGATIVE
Ketones, ur: NEGATIVE
Leukocytes, UA: NEGATIVE
Nitrite: NEGATIVE
PROTEIN: NEGATIVE
Specific Gravity, Urine: 1.023 (ref 1.001–1.03)
pH: <=5 (ref 5.0–8.0)

## 2017-11-17 LAB — COMPLETE METABOLIC PANEL WITH GFR
AG Ratio: 1.7 (calc) (ref 1.0–2.5)
ALKALINE PHOSPHATASE (APISO): 87 U/L (ref 33–130)
ALT: 15 U/L (ref 6–29)
AST: 21 U/L (ref 10–35)
Albumin: 4.7 g/dL (ref 3.6–5.1)
BUN/Creatinine Ratio: 11 (calc) (ref 6–22)
BUN: 14 mg/dL (ref 7–25)
CALCIUM: 9.8 mg/dL (ref 8.6–10.4)
CO2: 30 mmol/L (ref 20–32)
CREATININE: 1.24 mg/dL — AB (ref 0.50–1.05)
Chloride: 101 mmol/L (ref 98–110)
GFR, EST NON AFRICAN AMERICAN: 50 mL/min/{1.73_m2} — AB (ref >=60)
GFR, Est African American: 57 mL/min/{1.73_m2} — ABNORMAL LOW (ref >=60)
Globulin: 2.7 g/dL (calc) (ref 1.9–3.7)
Glucose, Bld: 85 mg/dL (ref 65–99)
POTASSIUM: 4.2 mmol/L (ref 3.5–5.3)
SODIUM: 141 mmol/L (ref 135–146)
Total Bilirubin: 0.5 mg/dL (ref 0.2–1.2)
Total Protein: 7.4 g/dL (ref 6.1–8.1)

## 2017-11-17 LAB — INSULIN, RANDOM: INSULIN: 9.4 u[IU]/mL (ref 2.0–19.6)

## 2017-11-17 LAB — MICROALBUMIN / CREATININE URINE RATIO
Creatinine, Urine: 200 mg/dL (ref 20–275)
Microalb Creat Ratio: 6 mcg/mg creat (ref <30)
Microalb, Ur: 1.2 mg/dL

## 2017-11-17 LAB — LIPID PANEL
CHOL/HDL RATIO: 2.8 (calc) (ref <5.0)
CHOLESTEROL: 191 mg/dL (ref <200)
HDL: 69 mg/dL (ref >50)
LDL CHOLESTEROL (CALC): 99 mg/dL
Non-HDL Cholesterol (Calc): 122 mg/dL (calc) (ref <130)
Triglycerides: 126 mg/dL (ref <150)

## 2017-11-17 LAB — MAGNESIUM: Magnesium: 2.2 mg/dL (ref 1.5–2.5)

## 2017-11-17 LAB — HEMOGLOBIN A1C
EAG (MMOL/L): 6 (calc)
Hgb A1c MFr Bld: 5.4 % of total Hgb (ref <5.7)
Mean Plasma Glucose: 108 (calc)

## 2017-11-17 LAB — IRON, TOTAL/TOTAL IRON BINDING CAP
%SAT: 30 % (ref 16–45)
IRON: 118 ug/dL (ref 45–160)
TIBC: 392 mcg/dL (calc) (ref 250–450)

## 2017-11-17 LAB — VITAMIN D 25 HYDROXY (VIT D DEFICIENCY, FRACTURES): VIT D 25 HYDROXY: 53 ng/mL (ref 30–100)

## 2017-11-17 LAB — VITAMIN B12: VITAMIN B 12: 754 pg/mL (ref 200–1100)

## 2017-11-17 LAB — TSH: TSH: 2.22 mIU/L

## 2017-11-23 ENCOUNTER — Other Ambulatory Visit: Payer: Self-pay | Admitting: Internal Medicine

## 2017-11-25 DIAGNOSIS — H6123 Impacted cerumen, bilateral: Secondary | ICD-10-CM | POA: Diagnosis not present

## 2017-11-25 DIAGNOSIS — H6983 Other specified disorders of Eustachian tube, bilateral: Secondary | ICD-10-CM | POA: Diagnosis not present

## 2017-11-26 ENCOUNTER — Ambulatory Visit: Payer: BLUE CROSS/BLUE SHIELD | Admitting: Physician Assistant

## 2017-11-26 ENCOUNTER — Encounter: Payer: Self-pay | Admitting: Physician Assistant

## 2017-11-26 VITALS — BP 108/66 | HR 62 | Ht 69.0 in | Wt 184.0 lb

## 2017-11-26 DIAGNOSIS — K219 Gastro-esophageal reflux disease without esophagitis: Secondary | ICD-10-CM

## 2017-11-26 DIAGNOSIS — K22719 Barrett's esophagus with dysplasia, unspecified: Secondary | ICD-10-CM

## 2017-11-26 DIAGNOSIS — K581 Irritable bowel syndrome with constipation: Secondary | ICD-10-CM

## 2017-11-26 NOTE — Patient Instructions (Signed)
Use your Pantoprazole 40 mg 30-60 minutes before breakfast and dinner.   Add Famotidine 30 mg before bedtime if needed.   You have been scheduled for an endoscopy. Please follow written instructions given to you at your visit today. If you use inhalers (even only as needed), please bring them with you on the day of your procedure. Your physician has requested that you go to www.[email or web address].com and enter the access code given to you at your visit today. This web site gives a general overview about your procedure. However, you should still follow specific instructions given to you by our office regarding your preparation for the procedure.

## 2017-11-26 NOTE — Progress Notes (Signed)
Chief Complaint: H/o Barrett's esophagus, GERD, IBS-C  HPI:    Pamela Welch is a 52 year old female with a past medical history as listed below, follows with Dr. Fuller Plan and who was referred to me by Unk Pinto, MD for a complaint of Barrett's esophagus, GERD and IBS-C .       03/04/2014 EGD with Barrett's esophagus in the distal esophagus.    03/16/2014 office visit Dr. Fuller Plan for follow-up after hospitalization, recent EGD had uncovered no source for her dark, heme positive stools, reflux was under excellent control, IBS-C was under excellent control and post discharge hemoglobin is normal.  At that time she was scheduled for a colonoscopy.  She was continued on Linzess 145 mcg daily MiraLAX twice daily as needed.  Patient had a history of Barrett's and reflux which is well controlled and continued on antireflux measures, Protonix 40 mg p.o. twice daily and famotidine 40 mg nightly.  EGD was recommended 04/2016 for Barrett's surveillance.    04/15/2014 colonoscopy showed melanosis coli in the right colon, otherwise normal as well as a grade 1 internal hemorrhoids.  Repeat was recommended in 10 years.    Today, explains that she was unaware that she was due for repeat endoscopy for Barrett's surveillance as she never got notice of this.  Currently is using Pantoprazole 40 mg every morning and sometimes at bedtime, but sometimes only uses Famotidine at night.  Does tell me that she will have occasional nighttime awakenings with a reflux material in her throat.  She has tried elevating the head of her bed but is unsuccessful at sleeping when she does this.  When she uses the Pantoprazole twice a day she typically does better but is worried about the side effects.    Constipation is under control with Linzess 145 mcg daily.    Denies fever, chills, weight loss, anorexia, nausea, vomiting or abdominal pain.  Past Medical History:  Diagnosis Date  . Allergy    SEASONAL  . Anxiety   . Back injury   .  Barrett's esophagus   . Chest pain, unspecified    normal myoview 2000;  echo 5/10:  mild LVH, EF 60-65%  . DDD (degenerative disc disease)   . Depression   . GERD (gastroesophageal reflux disease)   . Hiatal hernia   . HSV-1 (herpes simplex virus 1) infection   . Hyperlipidemia   . IBS (irritable bowel syndrome)   . Insomnia   . Lumbago   . Melanosis coli   . Palpitations    h/o symptomatic PVCs  . Palpitations   . Persistent disorder of initiating or maintaining sleep   . Restless legs syndrome (RLS)     Past Surgical History:  Procedure Laterality Date  . COLONOSCOPY  2013  . ESOPHAGOGASTRODUODENOSCOPY N/A 03/04/2014   Procedure: ESOPHAGOGASTRODUODENOSCOPY (EGD);  Surgeon: Ladene Artist, MD;  Location: [facility];  Service: Endoscopy;  Laterality: N/A;  . EYE SURGERY  2001  . REFRACTIVE SURGERY     bilateral eyes   . TUBES IN EARS     as an adult    Current Outpatient Medications  Medication Sig Dispense Refill  . buPROPion (WELLBUTRIN XL) 300 MG 24 hr tablet TAKE 1 TABLET BY MOUTH EVERY MORNING FOR MOOD 90 tablet 3  . escitalopram (LEXAPRO) 10 MG tablet Take 1 tablet daily for Mood 90 tablet 3  . famotidine (PEPCID) 40 MG tablet TAKE 1 TABLET BY MOUTH AT BEDTIME 90 tablet 1  . fluticasone (FLONASE) 50  MCG/ACT nasal spray Place 2 sprays into both nostrils as needed. Reported on 06/27/2015    . gabapentin (NEURONTIN) 600 MG tablet Take 1/2 to 1 tablet 3 to 4 x/ day as directed for back pain and sleep 360 tablet 1  . LINZESS 145 MCG CAPS capsule TAKE ONE CAPSULE BY MOUTH DAILY 90 capsule 0  . LORazepam (ATIVAN) 2 MG tablet Take 1 tablet (2 mg total) by mouth at bedtime. 60 tablet 0  . montelukast (SINGULAIR) 10 MG tablet TAKE 1 TABLET BY MOUTH DAILY 90 tablet 0  . pantoprazole (PROTONIX) 40 MG tablet Take 1 tablet (40 mg total) by mouth 2 (two) times daily. 180 tablet 3  . rosuvastatin (CRESTOR) 20 MG tablet TAKE 1 TABLET BY MOUTH DAILY FOR CHOLESTEROL GENERIC EQUIVALENT  FOR CRESTOR 90 tablet 0  . traZODone (DESYREL) 150 MG tablet TAKE ONE TO TWO TABLETS BY MOUTH ONE HOUR BEFORE SLEEP 180 tablet 0  . valACYclovir (VALTREX) 500 MG tablet   2   No current facility-administered medications for this visit.     Allergies as of 11/26/2017 - Review Complete 11/16/2017  Allergen Reaction Noted  . Atenolol Other (See Comments) 04/14/2013  . Ambien [zolpidem tartrate]  04/14/2013  . Cholestatin  05/06/2011  . Mirapex [pramipexole dihydrochloride]  04/14/2013  . Requip [ropinirole hcl]  04/14/2013    Family History  Problem Relation Age of Onset  . Hypertension Mother   . Osteoporosis Mother   . Breast cancer Sister 56       BRCA NEG/BILAT MASTECTOMY/CHEMO X 1 YR  . Hyperlipidemia Sister   . Ovarian cancer Sister   . Colon cancer Maternal Grandmother   . Stroke Maternal Grandmother   . Osteoporosis Maternal Grandmother   . Colon cancer Maternal Grandfather   . Multiple myeloma Paternal Grandmother   . Hypertension Father   . Hyperlipidemia Father   . Heart disease Cousin   . Lung cancer Cousin     Social History   Socioeconomic History  . Marital status: Single    Spouse name: Not on file  . Number of children: 2  . Years of education: Not on file  . Highest education level: Not on file  Occupational History  . Occupation: cpa    Employer: [employer]   Social Needs  . Financial resource strain: Not on file  . Food insecurity:    Worry: Not on file    Inability: Not on file  . Transportation needs:    Medical: Not on file    Non-medical: Not on file  Tobacco Use  . Smoking status: Never Smoker  . Smokeless tobacco: Never Used  Substance and Sexual Activity  . Alcohol use: Yes    Alcohol/week: 0.0 oz    Comment: Social  . Drug use: No  . Sexual activity: Yes    Birth control/protection: None    Comment: N/A  AGE OF INTERCOURSE QUESTIONS, SEXUAL PARTNERS MORE THAN 5 PT IS A LESBIAN  Lifestyle  . Physical activity:    Days per  week: Not on file    Minutes per session: Not on file  . Stress: Not on file  Relationships  . Social connections:    Talks on phone: Not on file    Gets together: Not on file    Attends religious service: Not on file    Active member of club or organization: Not on file    Attends meetings of clubs or organizations: Not on file  Relationship status: Not on file  . Intimate partner violence:    Fear of current or ex partner: Not on file    Emotionally abused: Not on file    Physically abused: Not on file    Forced sexual activity: Not on file  Other Topics Concern  . Not on file  Social History Narrative   Single.  Lives alone.  2 teenage children.    Review of Systems:    Constitutional: No weight loss, fever or chills Skin: No rash  Cardiovascular: No chest pain Respiratory: No SOB  Gastrointestinal: See HPI and otherwise negative Genitourinary: No dysuria  Neurological: No headache, dizziness or syncope Musculoskeletal: No new muscle or joint pain Hematologic: No bleeding or bruising Psychiatric: No history of depression or anxiety   Physical Exam:  Vital signs: BP 108/66   Pulse 62   Ht _0  (1.753 m)   Wt 184 lb (83.5 kg)   BMI 27.17 kg/m    Constitutional:   Very Pleasant Caucasian female appears to be in NAD, Well developed, Well nourished, alert and cooperative Head:  Normocephalic and atraumatic. Eyes:   PEERL, EOMI. No icterus. Conjunctiva pink. Ears:  Normal auditory acuity. Neck:  Supple Throat: Oral cavity and pharynx without inflammation, swelling or lesion.  Respiratory: Respirations even and unlabored. Lungs clear to auscultation bilaterally.   No wheezes, crackles, or rhonchi.  Cardiovascular: Normal S1, S2. No MRG. Regular rate and rhythm. No peripheral edema, cyanosis or pallor.  Gastrointestinal:  Soft, nondistended, nontender. No rebound or guarding. Normal bowel sounds. No appreciable masses or hepatomegaly. Rectal:  Not performed.  Msk:   Symmetrical without gross deformities. Without edema, no deformity or joint abnormality.  Neurologic:  Alert and  oriented x4;  grossly normal neurologically.  Skin:   Dry and intact without significant lesions or rashes. Psychiatric: Demonstrates good judgement and reason without abnormal affect or behaviors.  MOST RECENT LABS AND IMAGING: CBC    Component Value Date/Time   WBC 7.0 11/14/2017 0940   RBC 4.59 11/14/2017 0940   HGB 13.5 11/14/2017 0940   HCT 39.7 11/14/2017 0940   PLT 287 11/14/2017 0940   MCV 86.5 11/14/2017 0940   MCH 29.4 11/14/2017 0940   MCHC 34.0 11/14/2017 0940   RDW 12.4 11/14/2017 0940   LYMPHSABS 2,331 11/14/2017 0940   MONOABS 392 09/18/2016 1046   EOSABS 126 11/14/2017 0940   BASOSABS 49 11/14/2017 0940    CMP     Component Value Date/Time   NA 141 11/14/2017 0940   K 4.2 11/14/2017 0940   CL 101 11/14/2017 0940   CO2 30 11/14/2017 0940   GLUCOSE 85 11/14/2017 0940   BUN 14 11/14/2017 0940   CREATININE 1.24 (H) 11/14/2017 0940   CALCIUM 9.8 11/14/2017 0940   PROT 7.4 11/14/2017 0940   ALBUMIN 4.1 09/18/2016 1046   AST 21 11/14/2017 0940   ALT 15 11/14/2017 0940   ALKPHOS 76 09/18/2016 1046   BILITOT 0.5 11/14/2017 0940   GFRNONAA 50 (L) 11/14/2017 0940   GFRAA 57 (L) 11/14/2017 0940    Assessment: 1.  History of Barrett's esophagus: EGD 2015, repeat was recommended in 2 years, patient is overdue 2.  GERD: Controlled as long as she uses her Pantoprazole 40 mg twice daily, some nighttime awakenings if not 3.  IBS-C: Controlled on Linzess 145 mcg daily  Plan: 1.  Scheduled patient for a surveillance EGD due to history of Barrett's esophagus in [place] with Dr. Fuller Plan.  Did  discuss risk, benefits, limitations and alternatives and the patient agrees to proceed. 2.  Discussed proper medication usage with the patient today.  Recommend she use her Pantoprazole 40 mg twice daily, 30-60 minutes for breakfast and dinner.  If this is not completely  eradicating her reflux would recommend she then use Famotidine 30 mg at night as well. 3.  Reviewed antireflux diet and lifestyle modifications 4.  Continue Linzess 145 mcg daily for constipation 5.  Patient to follow in clinic per recommendations from Dr. Fuller Plan after time of procedure.  Ellouise Newer, PA-C [facility] 11/26/2017, 10:26 AM  Cc: Unk Pinto, MD

## 2017-11-28 ENCOUNTER — Other Ambulatory Visit: Payer: Self-pay | Admitting: Internal Medicine

## 2017-12-02 ENCOUNTER — Other Ambulatory Visit: Payer: Self-pay | Admitting: *Deleted

## 2017-12-02 ENCOUNTER — Telehealth: Payer: Self-pay | Admitting: *Deleted

## 2017-12-02 LAB — TB SKIN TEST
INDURATION: 0 mm
TB SKIN TEST: NEGATIVE

## 2017-12-02 MED ORDER — GABAPENTIN 600 MG PO TABS: ORAL_TABLET | ORAL | 1 refills | Status: DC

## 2017-12-02 NOTE — Telephone Encounter (Signed)
-----  Message from Unk Pinto, MD sent at 11/27/2017 12:09 AM EDT ----- Regarding: MMR  I guess lab never sent the MMR titer - so Pamela Welch will have to return if she'd like measles immunity checked  ----- Message ----- From: SYSTEM Sent: 11/19/2017  12:07 AM To: Unk Pinto, MD

## 2017-12-02 NOTE — Telephone Encounter (Signed)
The patient was called and advised the MMR lab test was not performed at her last OV.  Per Dr Melford Aase, she can return for a lab only appointment, if she still wants to have the test done.  The patient is aware and will call back to schedule, if desired.

## 2017-12-05 DIAGNOSIS — Z803 Family history of malignant neoplasm of breast: Secondary | ICD-10-CM | POA: Diagnosis not present

## 2017-12-05 DIAGNOSIS — Z1231 Encounter for screening mammogram for malignant neoplasm of breast: Secondary | ICD-10-CM | POA: Diagnosis not present

## 2017-12-05 LAB — HM MAMMOGRAPHY

## 2017-12-08 ENCOUNTER — Encounter: Payer: Self-pay | Admitting: *Deleted

## 2017-12-10 ENCOUNTER — Ambulatory Visit (AMBULATORY_SURGERY_CENTER): Payer: BLUE CROSS/BLUE SHIELD | Admitting: Gastroenterology

## 2017-12-10 ENCOUNTER — Encounter: Payer: Self-pay | Admitting: Gastroenterology

## 2017-12-10 VITALS — BP 137/71 | HR 61 | Temp 98.2°F | Resp 16 | Ht 69.0 in | Wt 184.0 lb

## 2017-12-10 DIAGNOSIS — K2270 Barrett's esophagus without dysplasia: Secondary | ICD-10-CM | POA: Diagnosis not present

## 2017-12-10 DIAGNOSIS — K22719 Barrett's esophagus with dysplasia, unspecified: Secondary | ICD-10-CM | POA: Diagnosis not present

## 2017-12-10 DIAGNOSIS — K219 Gastro-esophageal reflux disease without esophagitis: Secondary | ICD-10-CM | POA: Diagnosis not present

## 2017-12-10 MED ORDER — SODIUM CHLORIDE 0.9 % IV SOLN: 500.0000 mL | Freq: Once | INTRAVENOUS | Status: DC

## 2017-12-10 NOTE — Progress Notes (Signed)
A and O x3. Report to RN. Tolerated MAC anesthesia well.Teeth unchanged after procedure.

## 2017-12-10 NOTE — Patient Instructions (Signed)
Because history of Barrett's esophagus, biopsies taken today.   YOU HAD AN ENDOSCOPIC PROCEDURE TODAY AT [facility]:   Refer to the procedure report that was given to you for any specific questions about what was found during the examination.  If the procedure report does not answer your questions, please call your gastroenterologist to clarify.  If you requested that your care partner not be given the details of your procedure findings, then the procedure report has been included in a sealed envelope for you to review at your convenience later.  YOU SHOULD EXPECT: Some feelings of bloating in the abdomen. Passage of more gas than usual.  Walking can help get rid of the air that was put into your GI tract during the procedure and reduce the bloating. If you had a lower endoscopy (such as a colonoscopy or flexible sigmoidoscopy) you may notice spotting of blood in your stool or on the toilet paper. If you underwent a bowel prep for your procedure, you may not have a normal bowel movement for a few days.  Please Note:  You might notice some irritation and congestion in your nose or some drainage.  This is from the oxygen used during your procedure.  There is no need for concern and it should clear up in a day or so.  SYMPTOMS TO REPORT IMMEDIATELY:   Following upper endoscopy (EGD)  Vomiting of blood or coffee ground material  New chest pain or pain under the shoulder blades  Painful or persistently difficult swallowing  New shortness of breath  Fever of 100F or higher  Black, tarry-looking stools  For urgent or emergent issues, a gastroenterologist can be reached at any hour by calling [phone].   DIET:  We do recommend a small meal at first, but then you may proceed to your regular diet.  Drink plenty of fluids but you should avoid alcoholic beverages for 24 hours.  ACTIVITY:  You should plan to take it easy for the rest of today and you should NOT DRIVE or use heavy  machinery until tomorrow (because of the sedation medicines used during the test).    FOLLOW UP: Our staff will call the number listed on your records the next business day following your procedure to check on you and address any questions or concerns that you may have regarding the information given to you following your procedure. If we do not reach you, we will leave a message.  However, if you are feeling well and you are not experiencing any problems, there is no need to return our call.  We will assume that you have returned to your regular daily activities without incident.  If any biopsies were taken you will be contacted by phone or by letter within the next 1-3 weeks.  Please call us at [phone] if you have not heard about the biopsies in 3 weeks.    SIGNATURES/CONFIDENTIALITY: You and/or your care partner have signed paperwork which will be entered into your electronic medical record.  These signatures attest to the fact that that the information above on your After Visit Summary has been reviewed and is understood.  Full responsibility of the confidentiality of this discharge information lies with you and/or your care-partner.

## 2017-12-10 NOTE — Op Note (Signed)
[place] Patient Name: Pamela Welch Procedure Date: 12/10/2017 9:40 AM MRN: [id number] Endoscopist: Ladene Artist , MD Age: 52 Referring MD:  Date of Birth: February 03, 1966 Gender: Female Account #: [id number] Procedure:                Upper GI endoscopy Indications:              Surveillance for malignancy due to personal history                            of Barrett's esophagus Medicines:                Monitored Anesthesia Care Procedure:                Pre-Anesthesia Assessment:                           - Prior to the procedure, a History and Physical                            was performed, and patient medications and                            allergies were reviewed. The patient's tolerance of                            previous anesthesia was also reviewed. The risks                            and benefits of the procedure and the sedation                            options and risks were discussed with the patient.                            All questions were answered, and informed consent                            was obtained. Prior Anticoagulants: The patient has                            taken no previous anticoagulant or antiplatelet                            agents. ASA Grade Assessment: II - A patient with                            mild systemic disease. After reviewing the risks                            and benefits, the patient was deemed in                            satisfactory condition to undergo the procedure.  After obtaining informed consent, the endoscope was                            passed under direct vision. Throughout the                            procedure, the patient's blood pressure, pulse, and                            oxygen saturations were monitored continuously. The                            Endoscope was introduced through the mouth, and                            advanced to the second part of  duodenum. The upper                            GI endoscopy was accomplished without difficulty.                            The patient tolerated the procedure well. Scope In: Scope Out: Findings:                 There were esophageal mucosal changes secondary to                            established short-segment Barrett's disease present                            in the distal esophagus. The maximum longitudinal                            extent of these mucosal changes was 2 cm in length.                            Mucosa was biopsied with a cold forceps for                            histology randomly at intervals of 1 cm in the                            lower third of the esophagus. One specimen bottle                            was sent to pathology.                           The exam of the esophagus was otherwise normal.                           The entire examined stomach was normal.  The duodenal bulb and second portion of the                            duodenum were normal. Complications:            No immediate complications. Estimated Blood Loss:     Estimated blood loss was minimal. Impression:               - Esophageal mucosal changes secondary to                            established short-segment Barrett's disease.                            Biopsied.                           - Normal stomach.                           - Normal duodenal bulb and second portion of the                            duodenum. Recommendation:           - Patient has a contact number available for                            emergencies. The signs and symptoms of potential                            delayed complications were discussed with the                            patient. Return to normal activities tomorrow.                            Written discharge instructions were provided to the                            patient.                           -  Resume previous diet.                           - Continue present medications.                           - Await pathology results.                           - Repeat upper endoscopy in 3 years for                            surveillance if no dysplasia. Ladene Artist, MD 12/10/2017 10:00:17 AM This report has been signed electronically.

## 2017-12-10 NOTE — Progress Notes (Signed)
Called to room to assist during endoscopic procedure.  Patient ID and intended procedure confirmed with present staff. Received instructions for my participation in the procedure from the performing physician.  

## 2017-12-11 ENCOUNTER — Telehealth: Payer: Self-pay

## 2017-12-11 NOTE — Telephone Encounter (Signed)
Post procedure phone call, left voice mail.

## 2017-12-11 NOTE — Telephone Encounter (Signed)
  Follow up Call-  Call Javis Abboud number 12/10/2017  Post procedure Call Damika Harmon phone  # [phone] cell  Permission to leave phone message Yes  Some recent data might be hidden     Patient questions:  Do you have a fever, pain , or abdominal swelling? No. Pain Score  0 *  Have you tolerated food without any problems? Yes.    Have you been able to return to your normal activities? Yes.    Do you have any questions about your discharge instructions: Diet   No. Medications  No. Follow up visit  No.  Do you have questions or concerns about your Care? No.  Actions: * If pain score is 4 or above: No action needed, pain <4.

## 2017-12-12 DIAGNOSIS — H04123 Dry eye syndrome of bilateral lacrimal glands: Secondary | ICD-10-CM | POA: Diagnosis not present

## 2017-12-18 ENCOUNTER — Encounter: Payer: Self-pay | Admitting: Gastroenterology

## 2017-12-24 ENCOUNTER — Encounter: Payer: Self-pay | Admitting: Internal Medicine

## 2017-12-24 ENCOUNTER — Other Ambulatory Visit: Payer: Self-pay | Admitting: Internal Medicine

## 2018-01-05 ENCOUNTER — Other Ambulatory Visit: Payer: Self-pay | Admitting: Internal Medicine

## 2018-01-05 ENCOUNTER — Other Ambulatory Visit: Payer: Self-pay | Admitting: Physician Assistant

## 2018-01-21 ENCOUNTER — Telehealth: Payer: Self-pay

## 2018-01-21 ENCOUNTER — Encounter: Payer: BLUE CROSS/BLUE SHIELD | Admitting: Women's Health

## 2018-01-21 MED ORDER — GABAPENTIN 600 MG PO TABS: 600.0000 mg | ORAL_TABLET | Freq: Three times a day (TID) | ORAL | 0 refills | Status: DC

## 2018-01-21 NOTE — Telephone Encounter (Signed)
Request for a new prescription be resent for Gabapentin.

## 2018-01-31 ENCOUNTER — Other Ambulatory Visit: Payer: Self-pay | Admitting: Internal Medicine

## 2018-02-20 ENCOUNTER — Ambulatory Visit: Payer: Self-pay | Admitting: Adult Health

## 2018-02-27 ENCOUNTER — Ambulatory Visit: Payer: Self-pay | Admitting: Adult Health

## 2018-02-27 DIAGNOSIS — G5601 Carpal tunnel syndrome, right upper limb: Secondary | ICD-10-CM | POA: Diagnosis not present

## 2018-03-04 DIAGNOSIS — H6121 Impacted cerumen, right ear: Secondary | ICD-10-CM | POA: Diagnosis not present

## 2018-03-04 DIAGNOSIS — H7203 Central perforation of tympanic membrane, bilateral: Secondary | ICD-10-CM | POA: Diagnosis not present

## 2018-03-04 DIAGNOSIS — E663 Overweight: Secondary | ICD-10-CM | POA: Insufficient documentation

## 2018-03-04 NOTE — Progress Notes (Signed)
FOLLOW UP  Assessment and Plan:   Hypertension Well controlled with current medications  Monitor blood pressure at home; patient to call if consistently greater than 130/80 Continue DASH diet.   Reminder to go to the ER if any CP, SOB, nausea, dizziness, severe HA, changes vision/speech, left arm numbness and tingling and jaw pain.  Cholesterol Currently at goal; continue statin Continue low cholesterol diet and exercise.  Check lipid panel.   Overweight Long discussion about weight loss, diet, and exercise Recommended diet heavy in fruits and veggies and low in animal meats, cheeses, and dairy products, appropriate calorie intake Discussed ideal weight for height  Patient will work on reducing stress eating Will follow up in 3 months  Vitamin D Def Below goal at last visit;  continue supplementation to maintain goal of 70-100 Check Vit D level  Anxiety Well managed by current regimen; continue medications Stress management techniques discussed, increase water, good sleep hygiene discussed, increase exercise, and increase veggies.   GERD/Barrett's esophagus Well managed on current medications; continue PPI  Discussed diet, avoiding triggers and other lifestyle changes  Insomnia Severe, ongoing, poorly managed by medications Per patient request with refer to Dr. Brett Fairy for sleep study and discussion  Continue diet and meds as discussed. Further disposition pending results of labs. Discussed med's effects and SE's.   Over 30 minutes of exam, counseling, chart review, and critical decision making was performed.   Future Appointments  Date Time Provider [place]  05/29/2018  9:30 AM Unk Pinto, MD [facility] None  06/03/2018  2:00 PM Huel Cote, NP [facility] Pamela Welch  12/11/2018  9:00 AM Unk Pinto, MD [facility] None    ----------------------------------------------------------------------------------------------------------------------  HPI 52 y.o.  female  presents for 3 month follow up on hypertension, cholesterol, glucose management, weight and vitamin D deficiency.   She has ongoing chronic insomnia and also chronic low back pain with variable response improving sleep on low dose Gabapentin. She has been prescribed trazodone 150 mg to take at night for sleep, and also has ativan 2 mg. She is also prescribed wellbutrin 300 mg and lexapro 10 mg daily for mood/generalized anxiety.   Today she presents reqeusting to be referred back for a sleep study; she reports last was 10 years ago and was recommended may need repeated.   She has right wrist stiffness and intermittent parasthesias, has seen ortho, pending nerve conduction studies.    She also has GERD with known barrett's esophagus with symptoms controlled on current meds. Recent EGD in 11/2017 showed stable results.   BMI is Body mass index is 27.17 kg/m., she has been working on diet and exercise. Wt Readings from Last 3 Encounters:  03/05/18 184 lb (83.5 kg)  12/10/17 184 lb (83.5 kg)  11/26/17 184 lb (83.5 kg)   Her blood pressure has been controlled at home, today their BP is BP: 110/78  She does workout. She denies chest pain, shortness of breath, dizziness.   She is on cholesterol medication Rosuvastatin 20 mg daily and denies myalgias. Her cholesterol is at goal. The cholesterol last visit was:   Lab Results  Component Value Date   CHOL 191 11/14/2017   HDL 69 11/14/2017   LDLCALC 99 11/14/2017   TRIG 126 11/14/2017   CHOLHDL 2.8 11/14/2017    She has been working on diet and exercise for glucose management, and denies increased appetite, nausea, paresthesia of the feet, polydipsia, polyuria, visual disturbances and vomiting. Last A1C in the office was:  Lab Results  Component Value Date   HGBA1C 5.4 11/14/2017   Patient is on Vitamin D supplement.   Lab Results  Component Value Date   VD25OH 53 11/14/2017        Current Medications:  Current Outpatient  Medications on File Prior to Visit  Medication Sig  . buPROPion (WELLBUTRIN XL) 300 MG 24 hr tablet TAKE 1 TABLET BY MOUTH EVERY MORNING FOR MOOD  . escitalopram (LEXAPRO) 10 MG tablet Take 1 tablet daily for Mood  . famotidine (PEPCID) 40 MG tablet TAKE 1 TABLET BY MOUTH AT BEDTIME  . fluticasone (FLONASE) 50 MCG/ACT nasal spray Place 2 sprays into both nostrils as needed. Reported on 06/27/2015  . gabapentin (NEURONTIN) 600 MG tablet Take 1 tablet (600 mg total) by mouth 3 (three) times daily.  Marland Kitchen LINZESS 145 MCG CAPS capsule TAKE ONE CAPSULE BY MOUTH DAILY  . LORazepam (ATIVAN) 2 MG tablet Take 1 tablet (2 mg total) by mouth at bedtime. (Patient taking differently: Take 2 mg by mouth as needed. )  . montelukast (SINGULAIR) 10 MG tablet TAKE 1 TABLET BY MOUTH DAILY  . pantoprazole (PROTONIX) 40 MG tablet Take 1 tablet (40 mg total) by mouth 2 (two) times daily.  . rosuvastatin (CRESTOR) 20 MG tablet TAKE ONE TABLET BY MOUTH DAILY FOR CHOLESTEROL. GENERIC EQUIVALENT FOR CRESTOR.  . traZODone (DESYREL) 150 MG tablet TAKE ONE TO TWO TABLETS BY MOUTH ONE HOUR BEFORE SLEEP  . valACYclovir (VALTREX) 500 MG tablet   . gabapentin (NEURONTIN) 600 MG tablet Take 1/2 to 1 tablet by mouth daily.   No current facility-administered medications on file prior to visit.      Allergies:  Allergies  Allergen Reactions  . Atenolol Other (See Comments)    Made patient feel like a "zombie"  . Ambien [Zolpidem Tartrate]     Memory issues  . Cholestatin     Hot flashes  . Mirapex [Pramipexole Dihydrochloride]     Hot flashes  . Requip [Ropinirole Hcl]     Hot flashes     Medical History:  Past Medical History:  Diagnosis Date  . Allergy    SEASONAL  . Anxiety   . Back injury   . Barrett's esophagus   . Chest pain, unspecified    normal myoview 2000;  echo 5/10:  mild LVH, EF 60-65%  . DDD (degenerative disc disease)   . Depression   . GERD (gastroesophageal reflux disease)   . Hiatal hernia    . HSV-1 (herpes simplex virus 1) infection   . Hyperlipidemia   . IBS (irritable bowel syndrome)   . Insomnia   . Lumbago   . Melanosis coli   . Palpitations    h/o symptomatic PVCs  . Palpitations   . Persistent disorder of initiating or maintaining sleep   . Restless legs syndrome (RLS)    Family history- Reviewed and unchanged Social history- Reviewed and unchanged   Review of Systems:  Review of Systems  Constitutional: Negative for malaise/fatigue and weight loss.  HENT: Negative for hearing loss and tinnitus.   Eyes: Negative for blurred vision and double vision.  Respiratory: Negative for cough, shortness of breath and wheezing.   Cardiovascular: Negative for chest pain, palpitations, orthopnea, claudication and leg swelling.  Gastrointestinal: Negative for abdominal pain, blood in stool, constipation, diarrhea, heartburn, melena, nausea and vomiting.  Genitourinary: Negative.   Musculoskeletal: Negative for joint pain and myalgias.  Skin: Negative for rash.  Neurological: Negative for dizziness, tingling, sensory change, weakness and  headaches.  Endo/Heme/Allergies: Negative for polydipsia.  Psychiatric/Behavioral: Negative for depression. The patient is nervous/anxious and has insomnia.   All other systems reviewed and are negative.   Physical Exam: BP 110/78   Pulse 71   Temp (!) 97.3 F (36.3 C)   Ht 5\' 9"  (1.753 m)   Wt 184 lb (83.5 kg)   LMP 12/26/2015   SpO2 96%   BMI 27.17 kg/m  Wt Readings from Last 3 Encounters:  03/05/18 184 lb (83.5 kg)  12/10/17 184 lb (83.5 kg)  11/26/17 184 lb (83.5 kg)   General Appearance: Well nourished, in no apparent distress. Eyes: PERRLA, EOMs, conjunctiva no swelling or erythema Sinuses: No Frontal/maxillary tenderness ENT/Mouth: Ext aud canals clear, TMs without erythema, bulging. No erythema, swelling, or exudate on post pharynx.  Tonsils not swollen or erythematous. Hearing normal.  Neck: Supple, thyroid normal.   Respiratory: Respiratory effort normal, BS equal bilaterally without rales, rhonchi, wheezing or stridor.  Cardio: RRR with no MRGs. Brisk peripheral pulses without edema.  Abdomen: Soft, + BS.  Non tender, no guarding, rebound, hernias, masses. Lymphatics: Non tender without lymphadenopathy.  Musculoskeletal: Full ROM, 5/5 strength, Normal gait Skin: Warm, dry without rashes, lesions, ecchymosis.  Neuro: Cranial nerves intact. No cerebellar symptoms.  Psych: Awake and oriented X 3, normal affect, Insight and Judgment appropriate.    Pamela Ribas, NP 9:44 AM [facility]

## 2018-03-05 ENCOUNTER — Encounter: Payer: Self-pay | Admitting: Adult Health

## 2018-03-05 ENCOUNTER — Ambulatory Visit: Payer: BLUE CROSS/BLUE SHIELD | Admitting: Adult Health

## 2018-03-05 ENCOUNTER — Encounter: Payer: Self-pay | Admitting: Neurology

## 2018-03-05 VITALS — BP 110/78 | HR 71 | Temp 97.3°F | Ht 69.0 in | Wt 184.0 lb

## 2018-03-05 DIAGNOSIS — E785 Hyperlipidemia, unspecified: Secondary | ICD-10-CM | POA: Diagnosis not present

## 2018-03-05 DIAGNOSIS — K22719 Barrett's esophagus with dysplasia, unspecified: Secondary | ICD-10-CM | POA: Diagnosis not present

## 2018-03-05 DIAGNOSIS — G4700 Insomnia, unspecified: Secondary | ICD-10-CM

## 2018-03-05 DIAGNOSIS — Z79899 Other long term (current) drug therapy: Secondary | ICD-10-CM

## 2018-03-05 DIAGNOSIS — K2100 Gastro-esophageal reflux disease with esophagitis, without bleeding: Secondary | ICD-10-CM

## 2018-03-05 DIAGNOSIS — E663 Overweight: Secondary | ICD-10-CM

## 2018-03-05 DIAGNOSIS — R0989 Other specified symptoms and signs involving the circulatory and respiratory systems: Secondary | ICD-10-CM

## 2018-03-05 DIAGNOSIS — N182 Chronic kidney disease, stage 2 (mild): Secondary | ICD-10-CM | POA: Diagnosis not present

## 2018-03-05 DIAGNOSIS — E559 Vitamin D deficiency, unspecified: Secondary | ICD-10-CM

## 2018-03-05 DIAGNOSIS — R0683 Snoring: Secondary | ICD-10-CM

## 2018-03-05 DIAGNOSIS — F411 Generalized anxiety disorder: Secondary | ICD-10-CM

## 2018-03-05 MED ORDER — GABAPENTIN 100 MG PO CAPS: ORAL_CAPSULE | ORAL | 0 refills | Status: DC

## 2018-03-05 NOTE — Patient Instructions (Signed)
[place] -  Zyrtec - strong, sedating claritin - gentle  (generic equivalents are just as good - cheap at [employer], etc)  Saline nasal irrigations/flonase for stuffy/congested nose  mucinex can help with thick secretions  Insomnia Insomnia is frequent trouble falling and/or staying asleep. Insomnia can be a long term problem or a short term problem. Both are common. Insomnia can be a short term problem when the wakefulness is related to a certain stress or worry. Long term insomnia is often related to ongoing stress during waking hours and/or poor sleeping habits. Overtime, sleep deprivation itself can make the problem worse. Every little thing feels more severe because you are overtired and your ability to cope is decreased. CAUSES   Stress, anxiety, and depression.  Poor sleeping habits.  Distractions such as TV in the bedroom.  Naps close to bedtime.  Engaging in emotionally charged conversations before bed.  Technical reading before sleep.  Alcohol and other sedatives. They may make the problem worse. They can hurt normal sleep patterns and normal dream activity.  Stimulants such as caffeine for several hours prior to bedtime.  Pain syndromes and shortness of breath can cause insomnia.  Exercise late at night.  Changing time zones may cause sleeping problems (jet lag). It is sometimes helpful to have someone observe your sleeping patterns. They should look for periods of not breathing during the night (sleep apnea). They should also look to see how long those periods last. If you live alone or observers are uncertain, you can also be observed at a sleep clinic where your sleep patterns will be professionally monitored. Sleep apnea requires a checkup and treatment. Give your caregivers your medical history. Give your caregivers observations your family has made about your sleep.  SYMPTOMS   Not feeling rested in the morning.  Anxiety and restlessness at bedtime.  Difficulty  falling and staying asleep. TREATMENT   Your caregiver may prescribe treatment for an underlying medical disorders. Your caregiver can give advice or help if you are using alcohol or other drugs for self-medication. Treatment of underlying problems will usually eliminate insomnia problems.  Medications can be prescribed for short time use. They are generally not recommended for lengthy use.  Over-the-counter sleep medicines are not recommended for lengthy use. They can be habit forming.  You can promote easier sleeping by making lifestyle changes such as:  Using relaxation techniques that help with breathing and reduce muscle tension.  Exercising earlier in the day.  Changing your diet and the time of your last meal. No night time snacks.  Establish a regular time to go to bed.  Counseling can help with stressful problems and worry.  Soothing music and white noise may be helpful if there are background noises you cannot remove.  Stop tedious detailed work at least one hour before bedtime. HOME CARE INSTRUCTIONS   Keep a diary. Inform your caregiver about your progress. This includes any medication side effects. See your caregiver regularly. Take note of:  Times when you are asleep.  Times when you are awake during the night.  The quality of your sleep.  How you feel the next day. This information will help your caregiver care for you.  Get out of bed if you are still awake after 15 minutes. Read or do some quiet activity. Keep the lights down. Wait until you feel sleepy and go back to bed.  Keep regular sleeping and waking hours. Avoid naps.  Exercise regularly.  Avoid distractions at bedtime. Distractions include  watching television or engaging in any intense or detailed activity like attempting to balance the household checkbook.  Develop a bedtime ritual. Keep a familiar routine of bathing, brushing your teeth, climbing into bed at the same time each night, listening  to soothing music. Routines increase the success of falling to sleep faster.  Use relaxation techniques. This can be using breathing and muscle tension release routines. It can also include visualizing peaceful scenes. You can also help control troubling or intruding thoughts by keeping your mind occupied with boring or repetitive thoughts like the old concept of counting sheep. You can make it more creative like imagining planting one beautiful flower after another in your backyard garden.  During your day, work to eliminate stress. When this is not possible use some of the previous suggestions to help reduce the anxiety that accompanies stressful situations. MAKE SURE YOU:   Understand these instructions.  Will watch your condition.  Will get help right away if you are not doing well or get worse. Document Released: 05/10/2000 Document Revised: 08/05/2011 Document Reviewed: 06/10/2007 [facility] Patient Information 2015 [place], [place]. This information is not intended to replace advice given to you by your health care provider. Make sure you discuss any questions you have with your health care provider.

## 2018-03-06 ENCOUNTER — Other Ambulatory Visit: Payer: Self-pay | Admitting: *Deleted

## 2018-03-06 DIAGNOSIS — G5601 Carpal tunnel syndrome, right upper limb: Secondary | ICD-10-CM

## 2018-03-06 LAB — COMPLETE METABOLIC PANEL WITH GFR
AG Ratio: 1.6 (calc) (ref 1.0–2.5)
ALKALINE PHOSPHATASE (APISO): 99 U/L (ref 33–130)
ALT: 18 U/L (ref 6–29)
AST: 20 U/L (ref 10–35)
Albumin: 4.4 g/dL (ref 3.6–5.1)
BUN/Creatinine Ratio: 15 (calc) (ref 6–22)
BUN: 16 mg/dL (ref 7–25)
CALCIUM: 9.6 mg/dL (ref 8.6–10.4)
CO2: 29 mmol/L (ref 20–32)
CREATININE: 1.07 mg/dL — AB (ref 0.50–1.05)
Chloride: 103 mmol/L (ref 98–110)
GFR, EST NON AFRICAN AMERICAN: 59 mL/min/{1.73_m2} — AB (ref >=60)
GFR, Est African American: 69 mL/min/{1.73_m2} (ref >=60)
GLUCOSE: 81 mg/dL (ref 65–99)
Globulin: 2.8 g/dL (calc) (ref 1.9–3.7)
Potassium: 4.2 mmol/L (ref 3.5–5.3)
SODIUM: 142 mmol/L (ref 135–146)
TOTAL PROTEIN: 7.2 g/dL (ref 6.1–8.1)
Total Bilirubin: 0.6 mg/dL (ref 0.2–1.2)

## 2018-03-06 LAB — CBC WITH DIFFERENTIAL/PLATELET
BASOS PCT: 1.1 %
Basophils Absolute: 67 cells/uL (ref 0–200)
Eosinophils Absolute: 201 cells/uL (ref 15–500)
Eosinophils Relative: 3.3 %
HCT: 39.6 % (ref 35.0–45.0)
Hemoglobin: 13.4 g/dL (ref 11.7–15.5)
Lymphs Abs: 1830 cells/uL (ref 850–3900)
MCH: 29.5 pg (ref 27.0–33.0)
MCHC: 33.8 g/dL (ref 32.0–36.0)
MCV: 87 fL (ref 80.0–100.0)
MPV: 10.1 fL (ref 7.5–12.5)
Monocytes Relative: 6.9 %
Neutro Abs: 3581 cells/uL (ref 1500–7800)
Neutrophils Relative %: 58.7 %
PLATELETS: 275 10*3/uL (ref 140–400)
RBC: 4.55 10*6/uL (ref 3.80–5.10)
RDW: 12.4 % (ref 11.0–15.0)
TOTAL LYMPHOCYTE: 30 %
WBC mixed population: 421 cells/uL (ref 200–950)
WBC: 6.1 10*3/uL (ref 3.8–10.8)

## 2018-03-06 LAB — LIPID PANEL
CHOL/HDL RATIO: 2.9 (calc) (ref <5.0)
Cholesterol: 197 mg/dL (ref <200)
HDL: 67 mg/dL (ref >50)
LDL CHOLESTEROL (CALC): 99 mg/dL
NON-HDL CHOLESTEROL (CALC): 130 mg/dL — AB (ref <130)
Triglycerides: 184 mg/dL — ABNORMAL HIGH (ref <150)

## 2018-03-06 LAB — TSH: TSH: 2.4 mIU/L

## 2018-03-06 LAB — MAGNESIUM: MAGNESIUM: 1.8 mg/dL (ref 1.5–2.5)

## 2018-03-06 LAB — VITAMIN D 25 HYDROXY (VIT D DEFICIENCY, FRACTURES): Vit D, 25-Hydroxy: 45 ng/mL (ref 30–100)

## 2018-03-09 ENCOUNTER — Other Ambulatory Visit: Payer: Self-pay | Admitting: *Deleted

## 2018-03-09 DIAGNOSIS — G4700 Insomnia, unspecified: Secondary | ICD-10-CM

## 2018-03-09 MED ORDER — TRAZODONE HCL 150 MG PO TABS: ORAL_TABLET | ORAL | 0 refills | Status: DC

## 2018-03-09 MED ORDER — LINACLOTIDE 145 MCG PO CAPS: 145.0000 ug | ORAL_CAPSULE | Freq: Every day | ORAL | 1 refills | Status: DC

## 2018-03-09 MED ORDER — FAMOTIDINE 40 MG PO TABS: 40.0000 mg | ORAL_TABLET | Freq: Every day | ORAL | 1 refills | Status: DC

## 2018-03-10 ENCOUNTER — Other Ambulatory Visit: Payer: Self-pay | Admitting: Internal Medicine

## 2018-03-10 DIAGNOSIS — G4700 Insomnia, unspecified: Secondary | ICD-10-CM

## 2018-03-22 ENCOUNTER — Other Ambulatory Visit: Payer: Self-pay | Admitting: Internal Medicine

## 2018-03-22 MED ORDER — CITALOPRAM HYDROBROMIDE 20 MG PO TABS: 20.0000 mg | ORAL_TABLET | Freq: Every day | ORAL | 1 refills | Status: DC

## 2018-04-01 ENCOUNTER — Encounter: Payer: BLUE CROSS/BLUE SHIELD | Admitting: Women's Health

## 2018-04-02 ENCOUNTER — Encounter: Payer: BLUE CROSS/BLUE SHIELD | Admitting: Neurology

## 2018-04-13 ENCOUNTER — Other Ambulatory Visit: Payer: Self-pay | Admitting: Internal Medicine

## 2018-04-18 ENCOUNTER — Other Ambulatory Visit: Payer: Self-pay | Admitting: Internal Medicine

## 2018-04-18 MED ORDER — LINACLOTIDE 145 MCG PO CAPS: 145.0000 ug | ORAL_CAPSULE | Freq: Every day | ORAL | 3 refills | Status: DC

## 2018-04-19 ENCOUNTER — Other Ambulatory Visit: Payer: Self-pay | Admitting: Internal Medicine

## 2018-04-19 DIAGNOSIS — M545 Low back pain, unspecified: Secondary | ICD-10-CM

## 2018-04-19 DIAGNOSIS — K2100 Gastro-esophageal reflux disease with esophagitis, without bleeding: Secondary | ICD-10-CM

## 2018-04-19 DIAGNOSIS — G8929 Other chronic pain: Secondary | ICD-10-CM

## 2018-04-19 MED ORDER — PANTOPRAZOLE SODIUM 40 MG PO TBEC: 40.0000 mg | DELAYED_RELEASE_TABLET | Freq: Two times a day (BID) | ORAL | 3 refills | Status: DC

## 2018-04-19 MED ORDER — GABAPENTIN 100 MG PO CAPS: ORAL_CAPSULE | ORAL | 3 refills | Status: DC

## 2018-04-27 ENCOUNTER — Encounter: Payer: Self-pay | Admitting: Neurology

## 2018-04-27 ENCOUNTER — Ambulatory Visit: Payer: BLUE CROSS/BLUE SHIELD | Admitting: Neurology

## 2018-04-27 VITALS — BP 129/87 | HR 84 | Ht 69.0 in | Wt 179.0 lb

## 2018-04-27 DIAGNOSIS — R413 Other amnesia: Secondary | ICD-10-CM | POA: Diagnosis not present

## 2018-04-27 DIAGNOSIS — G2581 Restless legs syndrome: Secondary | ICD-10-CM

## 2018-04-27 DIAGNOSIS — G4700 Insomnia, unspecified: Secondary | ICD-10-CM | POA: Diagnosis not present

## 2018-04-27 DIAGNOSIS — F5103 Paradoxical insomnia: Secondary | ICD-10-CM

## 2018-04-27 NOTE — Progress Notes (Signed)
SLEEP MEDICINE CLINIC   Provider:  Larey Seat, M D  Primary Care Physician:  Unk Pinto, MD   Referring Provider: Unk Pinto, MD    Chief Complaint  Patient presents with  . New Patient (Initial Visit)    pt alone, rm 10. pt states that she has been struggling with insomnia for over 20 years and she is also having difficulty with memory concerns. sleep study was completed 10-12 years ago but was inconclusive for diagnosis. she has to take medication to sleep. pt currently takes trazadone and gabapentin to help with sleep.     HPI:  Pamela Welch is a 52 y.o. female patient, seen here on 04-27-2018 in a referral from Dr. Melford Aase and NP for a sleep evaluation.   [name] is a long time patient of Dr. Melford Aase, and has insomnia since her childhood, always struggled. She was even as a baby a poor sleeper, as her mother recalled, her mother and sister struggle with insomnia as well. She also is a "night owl". All female family members and her two maternal aunts are on sleeping medications. She had undergone a sleep study 15 years ago at [facility] with Dr. Gildardo Griffes- without results.   Chief complaint according to patient : I can't go to sleep and I can't stay asleep- and  I feel impaired during the day- menopausal symptoms are also present" and :  " I have forgotten so much - whole conversations".   Sleep habits are as follows: patient works in an office, outside of tax season she is home by 6 PM, work out until 6.30- 7 Pm, and usually has light dinner at the latest by 7.15 Pm. Takes a  shot shower, gets into her pyjamas, will take gabapentin -watch some TV -in the den. She will go upstairs and takes her medication ( trazodone / seroquel)  and goes to bed. Sleeps only in her bedroom , cool, quiet and dark.  No Tv in bed. Drowsy by 9.30 PM, sleeps by 11 PM.  She often thinks abut work, sometimes dreams about work, and sometimes solves her problems in her sleep - dreams of  a solution . She doesn't let her dog sleep in her bedroom, keeps good sleep hygiene.  She rises at 7.30 and goes to work by 8.30-9 AM and feels hung over. Coke for breakfast.    Sleep medical history and family sleep history: [name] is a long time patient of Dr. Melford Aase, and has insomnia since her childhood, always struggled. 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 She also is a "night owl". All female family members and her two maternal aunts are on sleeping medications. She had undergone a sleep study 15 years ago without results. She has PVCs when under stress.   Fall related Neck and back injuries, injection for pain.   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  One coke in AM, no coffee, no iced tea. Alcohol- rarely- social drinker 1-2 a month.  Non smoker lifelong, no shift work.   Review of Systems: Out of a complete 14 system review, the patient complains of only the following symptoms, and all other reviewed systems are negative.  I can lay down and get comfortable ,but I can't nap or sleep. Chronic back pain.   I snore  now- 3 years.   One nocturia-  vivid dreams - not able to distinguish reality and dream.   Epworth Sleepiness score  0/ 24 , Fatigue severity score 32/ 63   , depression score N/A   History of back injury, Barrett's esophagus, in 2000 and 2010 she had undergone Myoview and cardiac echo ejection fraction was normal she had mild left ventricular hypertrophy.  No cardiac impairment, she is a history of degenerative disc disease anxiety and depression, seasonal allergies, hyperlipidemia irritable bowel.  Has insomnia, a persistent disorder of initiating or maintaining sleep. Some night RLS.   Social History   Socioeconomic History  . Marital status: Single    Spouse name: Not on file  . Number of  children: 2  . Years of education: Not on file  . Highest education level: Not on file  Occupational History  . Occupation: cpa    Employer: [employer]   Social Needs  . Financial resource strain: Not on file  . Food insecurity:    Worry: Not on file    Inability: Not on file  . Transportation needs:    Medical: Not on file    Non-medical: Not on file  Tobacco Use  . Smoking status: Never Smoker  . Smokeless tobacco: Never Used  Substance and Sexual Activity  . Alcohol use: Yes    Alcohol/week: 1.0 standard drinks    Types: 1 Standard drinks or equivalent per week    Comment: Social  . Drug use: No  . Sexual activity: Yes    Birth control/protection: None, Post-menopausal  Lifestyle  . Physical activity:    Days per week: Not on file    Minutes per session: Not on file  . Stress: Not on file  Relationships  . Social connections:    Talks on phone: Not on file    Gets together: Not on file    Attends religious service: Not on file    Active member of club or organization: Not on file    Attends meetings of clubs or organizations: Not on file    Relationship status: Not on file  . Intimate partner violence:    Fear of current or ex partner: Not on file    Emotionally abused: Not on file    Physically abused: Not on file    Forced sexual activity: Not on file  Other Topics Concern  . Not on file  Social History Narrative   Single.  Lives alone.  2 teenage children.    Family History  Problem Relation Age of Onset  . Heart disease Cousin   . Lung cancer Cousin   . Hypertension Mother   . Osteoporosis Mother   . Breast cancer Sister 25       BRCA NEG/BILAT MASTECTOMY/CHEMO X 1 YR  . Hyperlipidemia Sister   . Ovarian cancer Sister   . Colon cancer Maternal Grandmother   . Stroke Maternal Grandmother   . Osteoporosis Maternal Grandmother   . Colon cancer Maternal Grandfather   . Multiple myeloma Paternal Grandmother   . Hypertension Father   .  Hyperlipidemia Father   . Esophageal cancer Neg Hx   . Liver cancer Neg Hx   . Pancreatic cancer Neg Hx   . Rectal cancer Neg Hx   . Stomach cancer Neg Hx     Past Medical History:  Diagnosis Date  . Allergy    SEASONAL  . Anxiety   . Back injury   . Barrett's esophagus   .  Chest pain, unspecified    normal myoview 2000;  echo 5/10:  mild LVH, EF 60-65%  . DDD (degenerative disc disease)   . Depression   . GERD (gastroesophageal reflux disease)   . Hiatal hernia   . HSV-1 (herpes simplex virus 1) infection   . Hyperlipidemia   . IBS (irritable bowel syndrome)   . Insomnia   . Lumbago   . Melanosis coli   . Palpitations    h/o symptomatic PVCs  . Palpitations   . Persistent disorder of initiating or maintaining sleep   . Restless legs syndrome (RLS)     Past Surgical History:  Procedure Laterality Date  . COLONOSCOPY  2013  . ESOPHAGOGASTRODUODENOSCOPY N/A 03/04/2014   Procedure: ESOPHAGOGASTRODUODENOSCOPY (EGD);  Surgeon: Ladene Artist, MD;  Location: [facility];  Service: Endoscopy;  Laterality: N/A;  . EYE SURGERY  2001  . REFRACTIVE SURGERY     bilateral eyes   . TUBES IN EARS     as an adult  . UPPER GASTROINTESTINAL ENDOSCOPY      Current Outpatient Medications  Medication Sig Dispense Refill  . buPROPion (WELLBUTRIN XL) 300 MG 24 hr tablet TAKE 1 TABLET BY MOUTH EVERY MORNING FOR MOOD 90 tablet 3  . citalopram (CELEXA) 20 MG tablet Take 1 tablet (20 mg total) by mouth daily. 90 tablet 1  . famotidine (PEPCID) 40 MG tablet Take 1 tablet (40 mg total) by mouth at bedtime. 90 tablet 1  . fluticasone (FLONASE) 50 MCG/ACT nasal spray Place 2 sprays into both nostrils as needed. Reported on 06/27/2015    . gabapentin (NEURONTIN) 100 MG capsule Take two capsules by mouth three times a day as needed for back pain 540 capsule 3  . linaclotide (LINZESS) 145 MCG CAPS capsule Take 1 capsule (145 mcg total) by mouth daily. 90 capsule 3  . LORazepam (ATIVAN) 2 MG  tablet Take 1 tablet (2 mg total) by mouth at bedtime. (Patient taking differently: Take 2 mg by mouth as needed. ) 60 tablet 0  . pantoprazole (PROTONIX) 40 MG tablet Take 1 tablet (40 mg total) by mouth 2 (two) times daily. 180 tablet 3  . rosuvastatin (CRESTOR) 20 MG tablet TAKE ONE TABLET BY MOUTH DAILY FOR CHOLESTEROL. GENERIC EQUIVALENT FOR CRESTOR. 90 tablet 1  . traZODone (DESYREL) 150 MG tablet TAKE ONE TO TWO TABLETS BY MOUTH ONE HOUR BEFORE SLEEP 180 tablet 0  . valACYclovir (VALTREX) 500 MG tablet   2   No current facility-administered medications for this visit.     Allergies as of 04/27/2018 - Review Complete 04/27/2018  Allergen Reaction Noted  . Atenolol Other (See Comments) 04/14/2013  . Ambien [zolpidem tartrate]  04/14/2013  . Cholestatin  05/06/2011  . Mirapex [pramipexole dihydrochloride]  04/14/2013  . Requip [ropinirole hcl]  04/14/2013    Vitals: BP 129/87   Pulse 84   Ht 5' 9"  (1.753 m)   Wt 179 lb (81.2 kg)   LMP 12/26/2015   BMI 26.43 kg/m  Last Weight:  Wt Readings from Last 1 Encounters:  04/27/18 179 lb (81.2 kg)   UYQ:IHKV mass index is 26.43 kg/m.     Last Height:   Ht Readings from Last 1 Encounters:  04/27/18 5' 9"  (1.753 m)    Physical exam:  General: The patient is awake, alert and appears not in acute distress. The patient is well groomed. Head: Normocephalic, atraumatic. Neck is supple.  Mallampati 3,  neck circumference: 14. 75 " . Nasal airflow  patent ,  Retrognathia is not seen. Biological teeth.  Cardiovascular:  Regular rate and rhythm , without  murmurs or carotid bruit, and without distended neck veins. Respiratory: Lungs are clear to auscultation. Skin:  Without evidence of edema, or rash Trunk: BMI is 26. 46 kg/m2 . The patient's posture :slouching    Neurologic exam : The patient is awake and alert, oriented to place and time.   Memory subjectivedescribed as impaired - Memory testing revealed    [place]: [place] Cognitive  Assessment  06/27/2015  Visuospatial/ Executive (0/5) 5  Naming (0/3) 3  Attention: Read list of digits (0/2) 2  Attention: Read list of letters (0/1) 1  Attention: Serial 7 subtraction starting at 100 (0/3) 3  Language: Repeat phrase (0/2) 2  Language : Fluency (0/1) 1  Abstraction (0/2) 2  Delayed Recall (0/5) 2  Orientation (0/6) 6  Total 27  Adjusted Score (based on education) 27   MMSE:No flowsheet data found.  Attention span & concentration ability appears normal.  Speech is fluent,  without  dysarthria, dysphonia or aphasia.  Mood and affect are worried/ but appropriate.  Cranial nerves: Pupils are equal and briskly reactive to light. Funduscopic exam without evidence of pallor or edema. Extraocular movements  in vertical and horizontal planes intact and without nystagmus. Visual fields by finger perimetry are intact. Hearing to finger rub intact.  Facial sensation intact to fine touch. Facial motor strength is symmetric and tongue and uvula move midline. Shoulder shrug was symmetrical.  Motor exam:  Normal tone, muscle bulk and symmetric strength in all extremities. Sensory:  Fine touch, pinprick and vibration were tested in all extremities. Proprioception tested in the upper extremities was normal. Coordination: Rapid alternating movements in the fingers/hands was normal. Finger-to-nose maneuver  normal without evidence of ataxia, dysmetria or tremor. Gait and station: Patient walks without assistive device and is able unassisted to climb up to the exam table. Strength within normal limits. Stance is stable and normal. Turns with 3 steps. Deep tendon reflexes: in the upper and lower extremities are symmetric and intact.   Assessment:  After physical and neurologic examination, review of laboratory studies,  Personal review of imaging studies, reports of other /same  Imaging studies, results of polysomnography and / or neurophysiology testing and pre-existing records as far as  provided in visit, my assessment is :    1) Chronic insomnia, and not correlated to organic reasons in the past. Stress and anxiety, depression cause worsening of sleep quality. Medication have helped to gain a routine sleep time. Implemented sleep hygiene years ago - has kept good habits.   2)  Now snoring , menopausal symptoms, SVT.   3) I suggested to change form 300 mg wellbutrin generic to 2 of 150 mg- the generic maker for the 150 mg had the better pharmaological profile.  I did not encourage using ativan, only prn ! .  Trazodone- 25, 50 and 75 mg did not work. 150 mg tab once a day is a higher dose then usual.   I gave the patient the boot camp insomnia instructions. RLS were treated with both dopamineric agonists ( Requip and Mirapex)  and gave her more hot flushes. None currently, no history of anemia.    The patient was advised of the nature of the diagnosed disorder , the treatment options and the  risks for general health and wellness arising from not treating the condition.   I spent more than 45 minutes of face to face  time with the patient.  Greater than 50% of time was spent in counseling and coordination of care. We have discussed the diagnosis and differential and I answered the patient's questions.    Plan:  Treatment plan and additional workup :  Attended sleep study with expanded EEG - I want to proof if she truly has insomnia or if she has a sleep perception disorder , and she has a risk of OSA , snoring, menopausal symptoms. RLS may be correlated to PLMs. - she described waking up in different positions and there is no witness to her sleep. She sleeps worse on vacation.    Will bring her meds to the sleep lab.       Larey Seat, MD 36/10/8157, 47:07 AM  Certified in Neurology by ABPN Certified in [place] by [facility] [address] [place], [place] [address]

## 2018-05-28 ENCOUNTER — Encounter: Payer: Self-pay | Admitting: Internal Medicine

## 2018-05-28 NOTE — Progress Notes (Signed)
This very nice 53 y.o. single WF  presents for 6 month follow up with HTN, HLD, Pre-Diabetes, GERD, IBS-C and Vitamin D Deficiency.      Patient has hx/o GERD/Barrett'sEsophagitis, IBS-C & in July 2019, she had surveillance EGD by Dr Fuller Plan & recc 3 year f/u  Due July 2022. (not due 10 yr f/u scr Colon until Nov 2025). Other problems include chronic LBP & chronic Insomnia.      Patient has been followed for Labile HTN expectantly since 2004.  BP has been controlled at home. Today's BP is at goal - 110/82. Patient has had no complaints of any cardiac type chest pain, palpitations, dyspnea / orthopnea / PND, dizziness, claudication, or dependent edema.     Hyperlipidemia is controlled with diet & meds. Patient denies myalgias or other med SE's. Last Lipids were at goal albeit sl elevated Trig"s; Lab Results  Component Value Date   CHOL 197 03/05/2018   HDL 67 03/05/2018   LDLCALC 99 03/05/2018   TRIG 184 (H) 03/05/2018   CHOLHDL 2.9 03/05/2018      Also, the patient has history of abnormal glucose & is followed expectantly for Glucose Intolerance.  She has had no symptoms of reactive hypoglycemia, diabetic polys, paresthesias or visual blurring.  Last A1c was Normal & at goal: Lab Results  Component Value Date   HGBA1C 5.4 11/14/2017      Further, the patient also has history of Vitamin D Deficiency ("36" / 2012)  and supplements vitamin D without any suspected side-effects. Last vitamin D was still not at goal (70-100):  Lab Results  Component Value Date   VD25OH 45 03/05/2018   Current Outpatient Medications on File Prior to Visit  Medication Sig  . buPROPion (WELLBUTRIN XL) 300 MG 24 hr tablet TAKE 1 TABLET BY MOUTH EVERY MORNING FOR MOOD  . citalopram (CELEXA) 20 MG tablet Take 1 tablet (20 mg total) by mouth daily.  . famotidine (PEPCID) 40 MG tablet Take 1 tablet (40 mg total) by mouth at bedtime.  . fluticasone (FLONASE) 50 MCG/ACT nasal spray Place 2 sprays into both  nostrils as needed. Reported on 06/27/2015  . gabapentin (NEURONTIN) 100 MG capsule Take two capsules by mouth three times a day as needed for back pain  . linaclotide (LINZESS) 145 MCG CAPS capsule Take 1 capsule (145 mcg total) by mouth daily.  Marland Kitchen LORazepam (ATIVAN) 2 MG tablet Take 1 tablet (2 mg total) by mouth at bedtime. (Patient taking differently: Take 2 mg by mouth as needed. )  . pantoprazole (PROTONIX) 40 MG tablet Take 1 tablet (40 mg total) by mouth 2 (two) times daily.  . rosuvastatin (CRESTOR) 20 MG tablet TAKE ONE TABLET BY MOUTH DAILY FOR CHOLESTEROL. GENERIC EQUIVALENT FOR CRESTOR.  . traZODone (DESYREL) 150 MG tablet TAKE ONE TO TWO TABLETS BY MOUTH ONE HOUR BEFORE SLEEP  . valACYclovir (VALTREX) 500 MG tablet    No current facility-administered medications on file prior to visit.    Allergies  Allergen Reactions  . Atenolol Other (See Comments)    Made patient feel like a "zombie"  . Ambien [Zolpidem Tartrate]     Memory issues  . Cholestatin     Hot flashes  . Mirapex [Pramipexole Dihydrochloride]     Hot flashes  . Requip [Ropinirole Hcl]     Hot flashes   PMHx:   Past Medical History:  Diagnosis Date  . Allergy    SEASONAL  . Anxiety   .  Back injury   . Barrett's esophagus   . Chest pain, unspecified    normal myoview 2000;  echo 5/10:  mild LVH, EF 60-65%  . DDD (degenerative disc disease)   . Depression   . GERD (gastroesophageal reflux disease)   . Hiatal hernia   . HSV-1 (herpes simplex virus 1) infection   . Hyperlipidemia   . IBS (irritable bowel syndrome)   . Insomnia   . Lumbago   . Melanosis coli   . Palpitations    h/o symptomatic PVCs  . Palpitations   . Persistent disorder of initiating or maintaining sleep   . Restless legs syndrome (RLS)    Immunization History  Administered Date(s) Administered  . Influenza Split 04/15/2013, 04/27/2014  . Influenza, Seasonal, Injecte, Preservative Fre 04/23/2016  . Influenza-Unspecified  03/27/2012  . PPD Test 04/15/2013, 04/27/2014, 06/30/2015, 09/18/2016, 11/14/2017  . Pneumococcal-Unspecified 05/27/2009  . Td 05/27/2001  . Tdap 04/08/2012   Past Surgical History:  Procedure Laterality Date  . COLONOSCOPY  2013  . ESOPHAGOGASTRODUODENOSCOPY N/A 03/04/2014   Procedure: ESOPHAGOGASTRODUODENOSCOPY (EGD);  Surgeon: Ladene Artist, MD;  Location: [facility];  Service: Endoscopy;  Laterality: N/A;  . EYE SURGERY  2001  . REFRACTIVE SURGERY     bilateral eyes   . TUBES IN EARS     as an adult  . UPPER GASTROINTESTINAL ENDOSCOPY     FHx:    Reviewed / unchanged  SHx:    Reviewed / unchanged   Systems Review:  Constitutional: Denies fever, chills, wt changes, headaches, insomnia, fatigue, night sweats, change in appetite. Eyes: Denies redness, blurred vision, diplopia, discharge, itchy, watery eyes.  ENT: Denies discharge, congestion, post nasal drip, epistaxis, sore throat, earache, hearing loss, dental pain, tinnitus, vertigo, sinus pain, snoring.  CV: Denies chest pain, palpitations, irregular heartbeat, syncope, dyspnea, diaphoresis, orthopnea, PND, claudication or edema. Respiratory: denies cough, dyspnea, DOE, pleurisy, hoarseness, laryngitis, wheezing.  Gastrointestinal: Denies dysphagia, odynophagia, heartburn, reflux, water brash, abdominal pain or cramps, nausea, vomiting, bloating, diarrhea, constipation, hematemesis, melena, hematochezia  or hemorrhoids. Genitourinary: Denies dysuria, frequency, urgency, nocturia, hesitancy, discharge, hematuria or flank pain. Musculoskeletal: Denies arthralgias, myalgias, stiffness, jt. swelling, pain, limping or strain/sprain.  Skin: Denies pruritus, rash, hives, warts, acne, eczema or change in skin lesion(s). Neuro: No weakness, tremor, incoordination, spasms, paresthesia or pain. Psychiatric: Denies confusion, memory loss or sensory loss. Endo: Denies change in weight, skin or hair change.  Heme/Lymph: No excessive  bleeding, bruising or enlarged lymph nodes.  Physical Exam  BP 110/82   Pulse 64   Temp 98.1 F (36.7 C)   Resp 16   Ht 5\' 9"  (1.753 m)   Wt 177 lb 12.8 oz (80.6 kg)   LMP 12/26/2015   BMI 26.26 kg/m   Appears  well nourished, well groomed  and in no distress.  Eyes: PERRLA, EOMs, conjunctiva no swelling or erythema. Sinuses: No frontal/maxillary tenderness ENT/Mouth: EAC's clear, TM's nl w/o erythema, bulging. Nares clear w/o erythema, swelling, exudates. Oropharynx clear without erythema or exudates. Oral hygiene is good. Tongue normal, non obstructing. Hearing intact.  Neck: Supple. Thyroid not palpable. Car 2+/2+ without bruits, nodes or JVD. Chest: Respirations nl with BS clear & equal w/o rales, rhonchi, wheezing or stridor.  Cor: Heart sounds normal w/ regular rate and rhythm without sig. murmurs, gallops, clicks or rubs. Peripheral pulses normal and equal  without edema.  Abdomen: Soft & bowel sounds normal. Non-tender w/o guarding, rebound, hernias, masses or organomegaly.  Lymphatics: Unremarkable.  Musculoskeletal: Full ROM all peripheral extremities, joint stability, 5/5 strength and normal gait.  Skin: Warm, dry without exposed rashes, lesions or ecchymosis apparent.  Neuro: Cranial nerves intact, reflexes equal bilaterally. Sensory-motor testing grossly intact. Tendon reflexes grossly intact.  Pysch: Alert & oriented x 3.  Insight and judgement nl & appropriate. No ideations.  Assessment and Plan:  1. Labile hypertension  - Continue medication, monitor blood pressure at home.  - Continue DASH diet.  Reminder to go to the ER if any CP,  SOB, nausea, dizziness, severe HA, changes vision/speech.  - CBC with Differential/Platelet - COMPLETE METABOLIC PANEL WITH GFR - Magnesium - TSH  2. Hyperlipidemia, mixed  - Continue diet/meds, exercise,& lifestyle modifications.  - Continue monitor periodic cholesterol/liver & renal functions   - Lipid panel - TSH  3.  Abnormal glucose  - Continue diet, exercise,  - lifestyle modifications.  - Monitor appropriate labs  - Hemoglobin A1c - Insulin, random  4. Vitamin D deficiency  - Continue supplementation.  - VITAMIN D 25 Hydroxyl  5. Gastroesophageal reflux disease  - CBC with Differential/Platelet  6. Irritable bowel syndrome with constipation   7. Medication management  - CBC with Differential/Platelet - COMPLETE METABOLIC PANEL WITH GFR - Magnesium - Lipid panel - TSH - Hemoglobin A1c - Insulin, random - VITAMIN D 25 Hydroxyl       Discussed  regular exercise, BP monitoring, weight control to achieve/maintain BMI less than 25 and discussed med and SE's. Recommended labs to assess and monitor clinical status with further disposition pending results of labs. Over 30 minutes of exam, counseling, chart review was performed.

## 2018-05-28 NOTE — Patient Instructions (Signed)

## 2018-05-29 ENCOUNTER — Ambulatory Visit: Payer: BLUE CROSS/BLUE SHIELD | Admitting: Internal Medicine

## 2018-05-29 VITALS — BP 110/82 | HR 64 | Temp 98.1°F | Resp 16 | Ht 69.0 in | Wt 177.8 lb

## 2018-05-29 DIAGNOSIS — Z79899 Other long term (current) drug therapy: Secondary | ICD-10-CM | POA: Diagnosis not present

## 2018-05-29 DIAGNOSIS — K581 Irritable bowel syndrome with constipation: Secondary | ICD-10-CM

## 2018-05-29 DIAGNOSIS — R0989 Other specified symptoms and signs involving the circulatory and respiratory systems: Secondary | ICD-10-CM | POA: Diagnosis not present

## 2018-05-29 DIAGNOSIS — R7309 Other abnormal glucose: Secondary | ICD-10-CM

## 2018-05-29 DIAGNOSIS — K2100 Gastro-esophageal reflux disease with esophagitis, without bleeding: Secondary | ICD-10-CM

## 2018-05-29 DIAGNOSIS — E782 Mixed hyperlipidemia: Secondary | ICD-10-CM | POA: Diagnosis not present

## 2018-05-29 DIAGNOSIS — E559 Vitamin D deficiency, unspecified: Secondary | ICD-10-CM | POA: Diagnosis not present

## 2018-05-29 MED ORDER — TOPIRAMATE 50 MG PO TABS: ORAL_TABLET | ORAL | 0 refills | Status: DC

## 2018-05-29 MED ORDER — PHENTERMINE HCL 37.5 MG PO TABS: ORAL_TABLET | ORAL | 0 refills | Status: DC

## 2018-05-31 ENCOUNTER — Other Ambulatory Visit: Payer: Self-pay | Admitting: Internal Medicine

## 2018-05-31 DIAGNOSIS — G4700 Insomnia, unspecified: Secondary | ICD-10-CM

## 2018-06-01 LAB — VITAMIN D 25 HYDROXY (VIT D DEFICIENCY, FRACTURES): Vit D, 25-Hydroxy: 39 ng/mL (ref 30–100)

## 2018-06-01 LAB — CBC WITH DIFFERENTIAL/PLATELET
Absolute Monocytes: 376 cells/uL (ref 200–950)
BASOS PCT: 0.9 %
Basophils Absolute: 59 cells/uL (ref 0–200)
Eosinophils Absolute: 191 cells/uL (ref 15–500)
Eosinophils Relative: 2.9 %
HCT: 41.3 % (ref 35.0–45.0)
HEMOGLOBIN: 13.3 g/dL (ref 11.7–15.5)
Lymphs Abs: 1795 cells/uL (ref 850–3900)
MCH: 28.1 pg (ref 27.0–33.0)
MCHC: 32.2 g/dL (ref 32.0–36.0)
MCV: 87.1 fL (ref 80.0–100.0)
MONOS PCT: 5.7 %
MPV: 10.4 fL (ref 7.5–12.5)
NEUTROS ABS: 4178 {cells}/uL (ref 1500–7800)
Neutrophils Relative %: 63.3 %
PLATELETS: 279 10*3/uL (ref 140–400)
RBC: 4.74 10*6/uL (ref 3.80–5.10)
RDW: 12.1 % (ref 11.0–15.0)
TOTAL LYMPHOCYTE: 27.2 %
WBC: 6.6 10*3/uL (ref 3.8–10.8)

## 2018-06-01 LAB — COMPLETE METABOLIC PANEL WITH GFR
AG Ratio: 1.6 (calc) (ref 1.0–2.5)
ALBUMIN MSPROF: 4.4 g/dL (ref 3.6–5.1)
ALKALINE PHOSPHATASE (APISO): 89 U/L (ref 33–130)
ALT: 13 U/L (ref 6–29)
AST: 18 U/L (ref 10–35)
BILIRUBIN TOTAL: 0.5 mg/dL (ref 0.2–1.2)
BUN/Creatinine Ratio: 16 (calc) (ref 6–22)
BUN: 19 mg/dL (ref 7–25)
CO2: 29 mmol/L (ref 20–32)
CREATININE: 1.17 mg/dL — AB (ref 0.50–1.05)
Calcium: 9.7 mg/dL (ref 8.6–10.4)
Chloride: 103 mmol/L (ref 98–110)
GFR, Est African American: 62 mL/min/{1.73_m2} (ref >=60)
GFR, Est Non African American: 53 mL/min/{1.73_m2} — ABNORMAL LOW (ref >=60)
GLUCOSE: 94 mg/dL (ref 65–99)
Globulin: 2.8 g/dL (calc) (ref 1.9–3.7)
POTASSIUM: 4.4 mmol/L (ref 3.5–5.3)
SODIUM: 141 mmol/L (ref 135–146)
TOTAL PROTEIN: 7.2 g/dL (ref 6.1–8.1)

## 2018-06-01 LAB — HEMOGLOBIN A1C
EAG (MMOL/L): 6.5 (calc)
Hgb A1c MFr Bld: 5.7 % of total Hgb — ABNORMAL HIGH (ref <5.7)
Mean Plasma Glucose: 117 (calc)

## 2018-06-01 LAB — LIPID PANEL
CHOL/HDL RATIO: 2.8 (calc) (ref <5.0)
Cholesterol: 187 mg/dL (ref <200)
HDL: 67 mg/dL (ref >50)
LDL Cholesterol (Calc): 98 mg/dL (calc)
Non-HDL Cholesterol (Calc): 120 mg/dL (calc) (ref <130)
Triglycerides: 123 mg/dL (ref <150)

## 2018-06-01 LAB — MAGNESIUM: Magnesium: 2 mg/dL (ref 1.5–2.5)

## 2018-06-01 LAB — INSULIN, RANDOM: Insulin: 3.9 u[IU]/mL (ref 2.0–19.6)

## 2018-06-01 LAB — TSH: TSH: 2.85 mIU/L

## 2018-06-03 ENCOUNTER — Ambulatory Visit (INDEPENDENT_AMBULATORY_CARE_PROVIDER_SITE_OTHER): Payer: BLUE CROSS/BLUE SHIELD | Admitting: Women's Health

## 2018-06-03 ENCOUNTER — Encounter: Payer: Self-pay | Admitting: Women's Health

## 2018-06-03 VITALS — BP 120/80 | Ht 69.0 in | Wt 181.0 lb

## 2018-06-03 DIAGNOSIS — Z01419 Encounter for gynecological examination (general) (routine) without abnormal findings: Secondary | ICD-10-CM

## 2018-06-03 NOTE — Addendum Note (Signed)
Addended by: Lorine Bears on: 06/03/2018 03:19 PM   Modules accepted: Orders

## 2018-06-03 NOTE — Patient Instructions (Addendum)
Vit E twice daily  Health Maintenance for Postmenopausal Women Menopause is a normal process in which your reproductive ability comes to an end. This process happens gradually over a span of months to years, usually between the ages of 58 and 66. Menopause is complete when you have missed 12 consecutive menstrual periods. It is important to talk with your health care provider about some of the most common conditions that affect postmenopausal women, such as heart disease, cancer, and bone loss (osteoporosis). Adopting a healthy lifestyle and getting preventive care can help to promote your health and wellness. Those actions can also lower your chances of developing some of these common conditions. What should I know about menopause? During menopause, you may experience a number of symptoms, such as:  Moderate-to-severe hot flashes.  Night sweats.  Decrease in sex drive.  Mood swings.  Headaches.  Tiredness.  Irritability.  Memory problems.  Insomnia. Choosing to treat or not to treat menopausal changes is an individual decision that you make with your health care provider. What should I know about hormone replacement therapy and supplements? Hormone therapy products are effective for treating symptoms that are associated with menopause, such as hot flashes and night sweats. Hormone replacement carries certain risks, especially as you become older. If you are thinking about using estrogen or estrogen with progestin treatments, discuss the benefits and risks with your health care provider. What should I know about heart disease and stroke? Heart disease, heart attack, and stroke become more likely as you age. This may be due, in part, to the hormonal changes that your body experiences during menopause. These can affect how your body processes dietary fats, triglycerides, and cholesterol. Heart attack and stroke are both medical emergencies. There are many things that you can do to help  prevent heart disease and stroke:  Have your blood pressure checked at least every 1-2 years. High blood pressure causes heart disease and increases the risk of stroke.  If you are 23-66 years old, ask your health care provider if you should take aspirin to prevent a heart attack or a stroke.  Do not use any tobacco products, including cigarettes, chewing tobacco, or electronic cigarettes. If you need help quitting, ask your health care provider.  It is important to eat a healthy diet and maintain a healthy weight. ? Be sure to include plenty of vegetables, fruits, low-fat dairy products, and lean protein. ? Avoid eating foods that are high in solid fats, added sugars, or salt (sodium).  Get regular exercise. This is one of the most important things that you can do for your health. ? Try to exercise for at least 150 minutes each week. The type of exercise that you do should increase your heart rate and make you sweat. This is known as moderate-intensity exercise. ? Try to do strengthening exercises at least twice each week. Do these in addition to the moderate-intensity exercise.  Know your numbers.Ask your health care provider to check your cholesterol and your blood glucose. Continue to have your blood tested as directed by your health care provider.  What should I know about cancer screening? There are several types of cancer. Take the following steps to reduce your risk and to catch any cancer development as early as possible. Breast Cancer  Practice breast self-awareness. ? This means understanding how your breasts normally appear and feel. ? It also means doing regular breast self-exams. Let your health care provider know about any changes, no matter how small.  If you are 79 or older, have a clinician do a breast exam (clinical breast exam or CBE) every year. Depending on your age, family history, and medical history, it may be recommended that you also have a yearly breast X-ray  (mammogram).  If you have a family history of breast cancer, talk with your health care provider about genetic screening.  If you are at high risk for breast cancer, talk with your health care provider about having an MRI and a mammogram every year.  Breast cancer (BRCA) gene test is recommended for women who have family members with BRCA-related cancers. Results of the assessment will determine the need for genetic counseling and BRCA1 and for BRCA2 testing. BRCA-related cancers include these types: ? Breast. This occurs in males or females. ? Ovarian. ? Tubal. This may also be called fallopian tube cancer. ? Cancer of the abdominal or pelvic lining (peritoneal cancer). ? Prostate. ? Pancreatic. Cervical, Uterine, and Ovarian Cancer Your health care provider may recommend that you be screened regularly for cancer of the pelvic organs. These include your ovaries, uterus, and vagina. This screening involves a pelvic exam, which includes checking for microscopic changes to the surface of your cervix (Pap test).  For women ages 21-65, health care providers may recommend a pelvic exam and a Pap test every three years. For women ages 65-65, they may recommend the Pap test and pelvic exam, combined with testing for human papilloma virus (HPV), every five years. Some types of HPV increase your risk of cervical cancer. Testing for HPV may also be done on women of any age who have unclear Pap test results.  Other health care providers may not recommend any screening for nonpregnant women who are considered low risk for pelvic cancer and have no symptoms. Ask your health care provider if a screening pelvic exam is right for you.  If you have had past treatment for cervical cancer or a condition that could lead to cancer, you need Pap tests and screening for cancer for at least 20 years after your treatment. If Pap tests have been discontinued for you, your risk factors (such as having a new sexual partner)  need to be reassessed to determine if you should start having screenings again. Some women have medical problems that increase the chance of getting cervical cancer. In these cases, your health care provider may recommend that you have screening and Pap tests more often.  If you have a family history of uterine cancer or ovarian cancer, talk with your health care provider about genetic screening.  If you have vaginal bleeding after reaching menopause, tell your health care provider.  There are currently no reliable tests available to screen for ovarian cancer. Lung Cancer Lung cancer screening is recommended for adults 21-73 years old who are at high risk for lung cancer because of a history of smoking. A yearly low-dose CT scan of the lungs is recommended if you:  Currently smoke.  Have a history of at least 30 pack-years of smoking and you currently smoke or have quit within the past 15 years. A pack-year is smoking an average of one pack of cigarettes per day for one year. Yearly screening should:  Continue until it has been 15 years since you quit.  Stop if you develop a health problem that would prevent you from having lung cancer treatment. Colorectal Cancer  This type of cancer can be detected and can often be prevented.  Routine colorectal cancer screening usually begins at  and continues through age 75.  If you have risk factors for colon cancer, your health care provider may recommend that you be screened at an earlier age.  If you have a family history of colorectal cancer, talk with your health care provider about genetic screening.  Your health care provider may also recommend using home test kits to check for hidden blood in your stool.  A small camera at the end of a tube can be used to examine your colon directly (sigmoidoscopy or colonoscopy). This is done to check for the earliest forms of colorectal cancer.  Direct examination of the colon should be repeated  every 5-10 years until age 75. However, if early forms of precancerous polyps or small growths are found or if you have a family history or genetic risk for colorectal cancer, you may need to be screened more often. Skin Cancer  Check your skin from head to toe regularly.  Monitor any moles. Be sure to tell your health care provider: ? About any new moles or changes in moles, especially if there is a change in a mole's shape or color. ? If you have a mole that is larger than the size of a pencil eraser.  If any of your family members has a history of skin cancer, especially at a Amante Fomby age, talk with your health care provider about genetic screening.  Always use sunscreen. Apply sunscreen liberally and repeatedly throughout the day.  Whenever you are outside, protect yourself by wearing long sleeves, pants, a wide-brimmed hat, and sunglasses. What should I know about osteoporosis? Osteoporosis is a condition in which bone destruction happens more quickly than new bone creation. After menopause, you may be at an increased risk for osteoporosis. To help prevent osteoporosis or the bone fractures that can happen because of osteoporosis, the following is recommended:  If you are 19-50 years old, get at least 1,000 mg of calcium and at least 600 mg of vitamin D per day.  If you are older than age 50 but younger than age 70, get at least 1,200 mg of calcium and at least 600 mg of vitamin D per day.  If you are older than age 70, get at least 1,200 mg of calcium and at least 800 mg of vitamin D per day. Smoking and excessive alcohol intake increase the risk of osteoporosis. Eat foods that are rich in calcium and vitamin D, and do weight-bearing exercises several times each week as directed by your health care provider. What should I know about how menopause affects my mental health? Depression may occur at any age, but it is more common as you become older. Common symptoms of depression  include:  Low or sad mood.  Changes in sleep patterns.  Changes in appetite or eating patterns.  Feeling an overall lack of motivation or enjoyment of activities that you previously enjoyed.  Frequent crying spells. Talk with your health care provider if you think that you are experiencing depression. What should I know about immunizations? It is important that you get and maintain your immunizations. These include:  Tetanus, diphtheria, and pertussis (Tdap) booster vaccine.  Influenza every year before the flu season begins.  Pneumonia vaccine.  Shingles vaccine. Your health care provider may also recommend other immunizations. This information is not intended to replace advice given to you by your health care provider. Make sure you discuss any questions you have with your health care provider. Document Released: 07/05/2005 Document Revised: 12/01/2015 Document Reviewed: 02/14/2015 Elsevier   02/14/2015 Elsevier Interactive Patient Education  2019 [employer] Inc[employer]  

## 2018-06-03 NOTE — Progress Notes (Signed)
Pamela Welch [birth year] [id number]    History:    Presents for annual exam.  Postmenopausal  on no HRT with no bleeding x2 years.  States is always warm no real hot flushes..  Normal Pap and mammogram history.  2015- colonoscopy.  2019- endoscopy, history of Barrett's esophagus.  Primary care manages hypertension, hypercholesteremia and anxiety/depression.  IBS.  Sister at age 54 breast cancer BRCA negative.  Not sexually active. . Past medical history, past surgical history, family history and social history were all reviewed and documented in the EPIC chart.  CPA.  Twins girls 72 ex partner delivered, one in college at [employer], one in college in [place].  Coparented.  ROS:  A ROS was performed and pertinent positives and negatives are included.  Exam:  Vitals:   06/03/18 1409  BP: 120/80  Weight: 181 lb (82.1 kg)  Height: 5' 9"  (1.753 m)   Body mass index is 26.73 kg/m.   General appearance:  Normal Thyroid:  Symmetrical, normal in size, without palpable masses or nodularity. Respiratory  Auscultation:  Clear without wheezing or rhonchi Cardiovascular  Auscultation:  Regular rate, without rubs, murmurs or gallops  Edema/varicosities:  Not grossly evident Abdominal  Soft,nontender, without masses, guarding or rebound.  Liver/spleen:  No organomegaly noted  Hernia:  None appreciated  Skin  Inspection:  Grossly normal   Breasts: Examined lying and sitting.     Right: Without masses, retractions, discharge or axillary adenopathy.     Left: Without masses, retractions, discharge or axillary adenopathy. Gentitourinary   Inguinal/mons:  Normal without inguinal adenopathy  External genitalia:  Normal  BUS/Urethra/Skene's glands:  Normal  Vagina:  Normal  Cervix:  Normal small polyp  Uterus: normal in size, shape and contour.  Midline and mobile  Adnexa/parametria:     Rt: Without masses or tenderness.   Lt: Without masses or tenderness.  Anus and perineum: Normal  Digital  rectal exam: Normal sphincter tone without palpated masses or tenderness  Assessment/Plan:  53 y.o. S54 WF G0/lesbian + twin daughters for annual exam.    Postmenopausal on no HRT with no bleeding Hypertension, hypercholesteremia, anxiety/depression-primary care manages labs and meds IBS/Barrett's esophagus-GI manages  Plan: Instructed to call if any further bleeding.  Aware of weight gain and is working to decrease calorie/carbs and working out 3-4 times weekly.  SBEs, continue annual screening mammogram, calcium rich foods, vitamin D 2000 daily encouraged.  Pap with HR HPV typing, new screening guidelines reviewed.    Huel Cote [facility], 2:36 PM 06/03/2018

## 2018-06-04 ENCOUNTER — Other Ambulatory Visit: Payer: Self-pay | Admitting: Internal Medicine

## 2018-06-04 LAB — PAP, TP IMAGING W/ HPV RNA, RFLX HPV TYPE 16,18/45: HPV DNA High Risk: NOT DETECTED

## 2018-06-09 ENCOUNTER — Encounter: Payer: Self-pay | Admitting: *Deleted

## 2018-06-09 DIAGNOSIS — H7203 Central perforation of tympanic membrane, bilateral: Secondary | ICD-10-CM | POA: Diagnosis not present

## 2018-06-09 DIAGNOSIS — H6983 Other specified disorders of Eustachian tube, bilateral: Secondary | ICD-10-CM | POA: Diagnosis not present

## 2018-06-09 DIAGNOSIS — J342 Deviated nasal septum: Secondary | ICD-10-CM | POA: Diagnosis not present

## 2018-06-09 DIAGNOSIS — K5909 Other constipation: Secondary | ICD-10-CM | POA: Insufficient documentation

## 2018-06-09 DIAGNOSIS — J343 Hypertrophy of nasal turbinates: Secondary | ICD-10-CM | POA: Diagnosis not present

## 2018-06-12 ENCOUNTER — Other Ambulatory Visit: Payer: Self-pay | Admitting: Internal Medicine

## 2018-06-12 MED ORDER — TOPIRAMATE 50 MG PO TABS: ORAL_TABLET | ORAL | 0 refills | Status: DC

## 2018-06-12 MED ORDER — PHENTERMINE HCL 37.5 MG PO TABS: ORAL_TABLET | ORAL | 1 refills | Status: DC

## 2018-06-18 ENCOUNTER — Other Ambulatory Visit: Payer: Self-pay | Admitting: Internal Medicine

## 2018-06-18 MED ORDER — PHENTERMINE HCL 37.5 MG PO TABS: ORAL_TABLET | ORAL | 1 refills | Status: DC

## 2018-06-20 ENCOUNTER — Other Ambulatory Visit: Payer: Self-pay | Admitting: Internal Medicine

## 2018-07-06 ENCOUNTER — Other Ambulatory Visit: Payer: Self-pay | Admitting: Internal Medicine

## 2018-08-07 ENCOUNTER — Other Ambulatory Visit: Payer: Self-pay | Admitting: Internal Medicine

## 2018-08-07 DIAGNOSIS — G4700 Insomnia, unspecified: Secondary | ICD-10-CM

## 2018-08-19 ENCOUNTER — Other Ambulatory Visit: Payer: Self-pay | Admitting: Internal Medicine

## 2018-09-04 ENCOUNTER — Other Ambulatory Visit: Payer: Self-pay | Admitting: Internal Medicine

## 2018-09-10 ENCOUNTER — Ambulatory Visit: Payer: Self-pay | Admitting: Physician Assistant

## 2018-11-03 ENCOUNTER — Telehealth: Payer: Self-pay | Admitting: *Deleted

## 2018-11-03 NOTE — Telephone Encounter (Signed)
Patient called and asked for an alternative for Linzess, due to the cost going from $35, with coupon, to $109 with coupon. Per Dr Melford Aase, the patient can try a fiber, such as Benefiber along with Mirlax, 17 mg dose, daily. The patient is aware and may continue on Linzess.

## 2018-11-17 ENCOUNTER — Other Ambulatory Visit: Payer: Self-pay | Admitting: Physician Assistant

## 2018-11-25 ENCOUNTER — Other Ambulatory Visit: Payer: Self-pay

## 2018-11-25 MED ORDER — ROSUVASTATIN CALCIUM 20 MG PO TABS: ORAL_TABLET | ORAL | 3 refills | Status: DC

## 2018-12-11 ENCOUNTER — Encounter: Payer: Self-pay | Admitting: Internal Medicine

## 2018-12-13 ENCOUNTER — Other Ambulatory Visit: Payer: Self-pay | Admitting: Internal Medicine

## 2018-12-13 MED ORDER — PHENTERMINE HCL 37.5 MG PO TABS: ORAL_TABLET | ORAL | 1 refills | Status: DC

## 2018-12-15 ENCOUNTER — Other Ambulatory Visit: Payer: Self-pay | Admitting: Internal Medicine

## 2018-12-23 NOTE — Progress Notes (Signed)
Annual Screening/Preventative Visit & Comprehensive Evaluation &  Examination     This very nice 53 y.o. single EWF presents for a Screening /Preventative Visit & comprehensive evaluation and management of multiple medical co-morbidities.  Patient has been followed for labile HTN, HLD, Prediabetes  and Vitamin D Deficiency. She also has hx/o chronic LBP and Chronic Insomnia.  Patient has hx/o GERD and is followed by Dr Fuller Plan for periodic EGD's.  She also has IBS-C controlled on her current meds.       Patient is followed expectantly with hx/o labile HTN predates circa 2004.  She also has CKD3 (GFR 53) and is followed by Dr Raymondo Band. Patient's BP has been controlled at home and patient denies any cardiac symptoms as chest pain, palpitations, shortness of breath, dizziness or ankle swelling. Today's BP is at goal - 116/88.      Patient's hyperlipidemia is controlled with diet (lost 28# over the last 6 months !). Current  lipids are at goal: Lab Results  Component Value Date   CHOL 179 12/24/2018   HDL 65 12/24/2018   LDLCALC 90 12/24/2018   TRIG 142 12/24/2018   CHOLHDL 2.8 12/24/2018      Patient has hx/o prediabetes predating since      and patient denies reactive hypoglycemic symptoms, visual blurring, diabetic polys or paresthesias. Last A1c was  Lab Results  Component Value Date   HGBA1C 5.5 12/24/2018      Finally, patient has history of Vitamin D Deficiency and current Vitamin D is still low: Lab Results  Component Value Date   VD25OH 100 12/24/2018   Current Outpatient Medications on File Prior to Visit  Medication Sig  . buPROPion (WELLBUTRIN XL) 300 MG 24 hr tablet Take 1 tablet every Morning for Mood, Focus& Concentration  . citalopram (CELEXA) 20 MG tablet TAKE 1 TABLET BY MOUTH EVERY DAY  . famotidine (PEPCID) 40 MG tablet Take 1 tablet (40 mg total) by mouth at bedtime.  . fluticasone (FLONASE) 50 MCG/ACT nasal spray Place 2 sprays into both nostrils as needed.  Reported on 06/27/2015  . gabapentin (NEURONTIN) 100 MG capsule Take two capsules by mouth three times a day as needed for back pain  . LINZESS 145 MCG CAPS capsule TAKE ONE CAPSULE BY MOUTH DAILY  . LORazepam (ATIVAN) 2 MG tablet Take 1 tablet (2 mg total) by mouth at bedtime. (Patient taking differently: Take 2 mg by mouth as needed. )  . pantoprazole (PROTONIX) 40 MG tablet Take 1 tablet (40 mg total) by mouth 2 (two) times daily.  . phentermine (ADIPEX-P) 37.5 MG tablet Take 1 tablet every morning for Dieting & Weight  Loss  . rosuvastatin (CRESTOR) 20 MG tablet Take 1 tablet Daily for Cholesterol  . topiramate (TOPAMAX) 50 MG tablet TAKE 1 TABLET BY MOUTH TWICE DAILY AT SUPPERTIME AND BEDTIME FOR DIETING AND WEIGHTLOSS  . traZODone (DESYREL) 150 MG tablet TAKE ONE TO TWO TABLETS BY MOUTH ONE HOUR BEFORE SLEEP  . valACYclovir (VALTREX) 500 MG tablet    No current facility-administered medications on file prior to visit.    Allergies  Allergen Reactions  . Atenolol Other (See Comments)    Made patient feel like a "zombie"  . Ambien [Zolpidem Tartrate]     Memory issues  . Cholestatin     Hot flashes  . Mirapex [Pramipexole Dihydrochloride]     Hot flashes  . Requip [Ropinirole Hcl]     Hot flashes   Past Medical History:  Diagnosis  Date  . Allergy    SEASONAL  . Anxiety   . Back injury   . Barrett's esophagus   . Chest pain, unspecified    normal myoview 2000;  echo 5/10:  mild LVH, EF 60-65%  . DDD (degenerative disc disease)   . Depression   . GERD (gastroesophageal reflux disease)   . Hiatal hernia   . HSV-1 (herpes simplex virus 1) infection   . Hyperlipidemia   . IBS (irritable bowel syndrome)   . Insomnia   . Lumbago   . Melanosis coli   . Palpitations    h/o symptomatic PVCs  . Palpitations   . Persistent disorder of initiating or maintaining sleep   . Restless legs syndrome (RLS)    Health Maintenance  Topic Date Due  . MAMMOGRAM  12/06/2018  .  INFLUENZA VACCINE  12/26/2018  . PAP SMEAR-Modifier  06/03/2021  . TETANUS/TDAP  04/08/2022  . COLONOSCOPY  04/15/2024  . HIV Screening  Completed   Immunization History  Administered Date(s) Administered  . Influenza Split 04/15/2013, 04/27/2014  . Influenza, Seasonal, Injecte, Preservative Fre 04/23/2016  . Influenza-Unspecified 03/27/2012  . PPD Test 04/15/2013, 04/27/2014, 06/30/2015, 09/18/2016, 11/14/2017, 12/24/2018  . Pneumococcal-Unspecified 05/27/2009  . Td 05/27/2001  . Tdap 04/08/2012   Last Colon - 04/20/2014 - Dr  Fuller Plan - recc 10 yr f/u due Nov 2025. Last EGD - 12/10/2017 - Dr Fuller Plan (+) Barrett's - recc 3 yr f/u in July 2022  Last MGM - 12/05/2017  Past Surgical History:  Procedure Laterality Date  . COLONOSCOPY  2013  . ESOPHAGOGASTRODUODENOSCOPY N/A 03/04/2014   Procedure: ESOPHAGOGASTRODUODENOSCOPY (EGD);  Surgeon: Ladene Artist, MD;  Location: [facility];  Service: Endoscopy;  Laterality: N/A;  . EYE SURGERY  2001  . REFRACTIVE SURGERY     bilateral eyes   . TUBES IN EARS     as an adult  . UPPER GASTROINTESTINAL ENDOSCOPY     Family History  Problem Relation Age of Onset  . Heart disease Cousin   . Lung cancer Cousin   . Hypertension Mother   . Osteoporosis Mother   . Breast cancer Sister 27       BRCA NEG/BILAT MASTECTOMY/CHEMO X 1 YR  . Hyperlipidemia Sister   . Ovarian cancer Sister   . Colon cancer Maternal Grandmother   . Stroke Maternal Grandmother   . Osteoporosis Maternal Grandmother   . Colon cancer Maternal Grandfather   . Multiple myeloma Paternal Grandmother   . Hypertension Father   . Hyperlipidemia Father   . Esophageal cancer Neg Hx   . Liver cancer Neg Hx   . Pancreatic cancer Neg Hx   . Rectal cancer Neg Hx   . Stomach cancer Neg Hx    Social History   Tobacco Use  . Smoking status: Never Smoker  . Smokeless tobacco: Never Used  Substance Use Topics  . Alcohol use: Yes    Alcohol/week: 1.0 standard drinks    Types:  1 Standard drinks or equivalent per week    Comment: Social  . Drug use: No    ROS Constitutional: Denies fever, chills, weight loss/gain, headaches, insomnia,  night sweats, and change in appetite. Does c/o fatigue. Eyes: Denies redness, blurred vision, diplopia, discharge, itchy, watery eyes.  ENT: Denies discharge, congestion, post nasal drip, epistaxis, sore throat, earache, hearing loss, dental pain, Tinnitus, Vertigo, Sinus pain, snoring.  Cardio: Denies chest pain, palpitations, irregular heartbeat, syncope, dyspnea, diaphoresis, orthopnea, PND, claudication, edema Respiratory: denies cough,  dyspnea, DOE, pleurisy, hoarseness, laryngitis, wheezing.  Gastrointestinal: Denies dysphagia, heartburn, reflux, water brash, pain, cramps, nausea, vomiting, bloating, diarrhea, constipation, hematemesis, melena, hematochezia, jaundice, hemorrhoids Genitourinary: Denies dysuria, frequency, urgency, nocturia, hesitancy, discharge, hematuria, flank pain Breast: Breast lumps, nipple discharge, bleeding.  Musculoskeletal: Denies arthralgia, myalgia, stiffness, Jt. Swelling, pain, limp, and strain/sprain. Denies falls. Skin: Denies puritis, rash, hives, warts, acne, eczema, changing in skin lesion Neuro: No weakness, tremor, incoordination, spasms, paresthesia, pain Psychiatric: Denies confusion, memory loss, sensory loss. Denies Depression. Endocrine: Denies change in weight, skin, hair change, nocturia, and paresthesia, diabetic polys, visual blurring, hyper / hypo glycemic episodes.  Heme/Lymph: No excessive bleeding, bruising, enlarged lymph nodes.  Physical Exam  BP 116/88   Pulse 68   Temp (!) 97 F (36.1 C)   Resp 16   Ht 5' 9"  (1.753 m)   Wt 149 lb 3.2 oz (67.7 kg)   LMP 12/26/2015   BMI 22.03 kg/m   General Appearance: Well nourished, well groomed and in no apparent distress.  Eyes: PERRLA, EOMs, conjunctiva no swelling or erythema, normal fundi and vessels. Sinuses: No  frontal/maxillary tenderness ENT/Mouth: EACs patent / TMs  nl. Nares clear without erythema, swelling, mucoid exudates. Oral hygiene is good. No erythema, swelling, or exudate. Tongue normal, non-obstructing. Tonsils not swollen or erythematous. Hearing normal.  Neck: Supple, thyroid not palpable. No bruits, nodes or JVD. Respiratory: Respiratory effort normal.  BS equal and clear bilateral without rales, rhonci, wheezing or stridor. Cardio: Heart sounds are normal with regular rate and rhythm and no murmurs, rubs or gallops. Peripheral pulses are normal and equal bilaterally without edema. No aortic or femoral bruits. Chest: symmetric with normal excursions and percussion. Breasts: Symmetric, without lumps, nipple discharge, retractions, or fibrocystic changes.  Abdomen: Flat, soft with bowel sounds active. Nontender, no guarding, rebound, hernias, masses, or organomegaly.  Lymphatics: Non tender without lymphadenopathy.  Genitourinary:  Musculoskeletal: Full ROM all peripheral extremities, joint stability, 5/5 strength, and normal gait. Skin: Warm and dry without rashes, lesions, cyanosis, clubbing or  ecchymosis.  Neuro: Cranial nerves intact, reflexes equal bilaterally. Normal muscle tone, no cerebellar symptoms. Sensation intact.  Pysch: Alert and oriented X 3, normal affect, Insight and Judgment appropriate.   Assessment and Plan  1. Annual Preventative Screening Examination  2. Labile hypertension  - EKG 12-Lead - [place], RETROPERITNL ABD,  LTD - Urinalysis, Routine w reflex microscopic - Microalbumin / creatinine urine ratio - CBC with Differential/Platelet - COMPLETE METABOLIC PANEL WITH GFR - Magnesium - TSH  3. Hyperlipidemia, mixed  - EKG 12-Lead - [place], RETROPERITNL ABD,  LTD - Lipid panel - TSH  4. Abnormal glucose  - EKG 12-Lead - [place], RETROPERITNL ABD,  LTD - Hemoglobin A1c - Insulin, random  5. Vitamin D deficiency   - VITAMIN D 25 Hydroxyl  6.  Gastroesophageal reflux disease  - CBC with Differential/Platelet  7. CKD (chronic kidney disease) stage 3, GFR 30-59 ml/min (HCC)  - Urinalysis, Routine w reflex microscopic - Microalbumin / creatinine urine ratio  8. Irritable bowel syndrome with constipation  9. Screening examination for pulmonary tuberculosis  - TB Skin Test  10. Screening for colorectal cancer  - POC Hemoccult Bld/Stl  11. Screening for ischemic heart disease  - EKG 12-Lead  12. FHx: heart disease  - EKG 12-Lead - [place], RETROPERITNL ABD,  LTD  13. Screening for AAA (aortic abdominal aneurysm)  - [place], RETROPERITNL ABD,  LTD  14. Fatigue  - Iron,Total/Total Iron Binding Cap - Vitamin B12  15. Medication management  - Urinalysis, Routine w reflex microscopic - Microalbumin / creatinine urine ratio - CBC with Differential/Platelet - COMPLETE METABOLIC PANEL WITH GFR - Magnesium - Lipid panel - TSH - Hemoglobin A1c - Insulin, random - VITAMIN D 25 Hydroxyl        Patient was counseled in prudent diet to achieve/maintain BMI less than 25 for weight control, BP monitoring, regular exercise and medications. Discussed med's effects and SE's. Screening labs and tests as requested with regular follow-up as recommended. Over 40 minutes of exam, counseling, chart review and high complex critical decision making was performed.   Kirtland Bouchard, MD

## 2018-12-23 NOTE — Patient Instructions (Signed)
- Vit D  And Vit C 1,000 mg  are recommended to help protect  against the Covid_19 and other Corona viruses.   - Also it's recommended to take Zinc 50 mg to help  protect against the Covid_19  And best place to get  is also on [email or web address] and don't pay more than 6-8 cents /pill !   ================================ Coronavirus (COVID-19) Are you at risk?  Are you at risk for the Coronavirus (COVID-19)?  To be considered HIGH RISK for Coronavirus (COVID-19), you have to meet the following criteria:  . Traveled to [place], [place], [place], [place] or [place]; or in the [place] to [place], [place], [place]  . or [place]; and have fever, cough, and shortness of breath within the last 2 weeks of travel OR . Been in close contact with a person diagnosed with COVID-19 within the last 2 weeks and have  . fever, cough,and shortness of breath .  . IF YOU DO NOT MEET THESE CRITERIA, YOU ARE CONSIDERED LOW RISK FOR COVID-19.  What to do if you are HIGH RISK for COVID-19?  Marland Kitchen If you are having a medical emergency, call 911. . Seek medical care right away. Before you go to a doctor's office, urgent care or emergency department, .  call ahead and tell them about your recent travel, contact with someone diagnosed with COVID-19  .  and your symptoms.  . You should receive instructions from your physician's office regarding next steps of care.  . When you arrive at healthcare provider, tell the healthcare staff immediately you have returned from  . visiting [place], [place], [place], [place] or [place]; or traveled in the [place] to [place], [place],  . [place] or [place] in the last two weeks or you have been in close contact with a person diagnosed with  . COVID-19 in the last 2 weeks.   . Tell the health care staff about your symptoms: fever, cough and shortness of breath. . After you have been seen by a medical provider, you will be either: o Tested for (COVID-19) and  discharged home on quarantine except to seek medical care if  o symptoms worsen, and asked to  - Stay home and avoid contact with others until you get your results (4-5 days)  - Avoid travel on public transportation if possible (such as bus, train, or airplane) or o Sent to the Emergency Department by EMS for evaluation, COVID-19 testing  and  o possible admission depending on your condition and test results.  What to do if you are LOW RISK for COVID-19?  Reduce your risk of any infection by using the same precautions used for avoiding the common cold or flu:  Marland Kitchen Wash your hands often with soap and warm water for at least 20 seconds.  If soap and water are not readily available,  . use an alcohol-based hand sanitizer with at least 60% alcohol.  . If coughing or sneezing, cover your mouth and nose by coughing or sneezing into the elbow areas of your shirt or coat, .  into a tissue or into your sleeve (not your hands). . Avoid shaking hands with others and consider head nods or verbal greetings only. . Avoid touching your eyes, nose, or mouth with unwashed hands.  . Avoid close contact with people who are sick. . Avoid places or events with large numbers of people in one location, like concerts or sporting events. . Carefully consider travel plans  you have or are making. . If you are planning any travel outside or inside the [place], visit the CDC's Travelers' Health webpage for the latest health notices. . If you have some symptoms but not all symptoms, continue to monitor at home and seek medical attention  . if your symptoms worsen. . If you are having a medical emergency, call 911. >>>>>>>>>>>>>>>>>>>>>>> Preventive Care for Adults  A healthy lifestyle and preventive care can promote health and wellness. Preventive health guidelines for women include the following key practices.  A routine yearly physical is a good way to check with your health care provider about your health and preventive  screening. It is a chance to share any concerns and updates on your health and to receive a thorough exam.  Visit your dentist for a routine exam and preventive care every 6 months. Brush your teeth twice a day and floss once a day. Good oral hygiene prevents tooth decay and gum disease.  The frequency of eye exams is based on your age, health, family medical history, use of contact lenses, and other factors. Follow your health care provider's recommendations for frequency of eye exams.  Eat a healthy diet. Foods like vegetables, fruits, whole grains, low-fat dairy products, and lean protein foods contain the nutrients you need without too many calories. Decrease your intake of foods high in solid fats, added sugars, and salt. Eat the right amount of calories for you. Get information about a proper diet from your health care provider, if necessary.  Regular physical exercise is one of the most important things you can do for your health. Most adults should get at least 150 minutes of moderate-intensity exercise (any activity that increases your heart rate and causes you to sweat) each week. In addition, most adults need muscle-strengthening exercises on 2 or more days a week.  Maintain a healthy weight. The body mass index (BMI) is a screening tool to identify possible weight problems. It provides an estimate of body fat based on height and weight. Your health care provider can find your BMI and can help you achieve or maintain a healthy weight. For adults 20 years and older:  A BMI below 18.5 is considered underweight.  A BMI of 18.5 to 24.9 is normal.  A BMI of 25 to 29.9 is considered overweight.  A BMI of 30 and above is considered obese.  Maintain normal blood lipids and cholesterol levels by exercising and minimizing your intake of saturated fat. Eat a balanced diet with plenty of fruit and vegetables. Blood tests for lipids and cholesterol should begin at age 65 and be repeated every 5  years. If your lipid or cholesterol levels are high, you are over 50, or you are at high risk for heart disease, you may need your cholesterol levels checked more frequently. Ongoing high lipid and cholesterol levels should be treated with medicines if diet and exercise are not working.  If you smoke, find out from your health care provider how to quit. If you do not use tobacco, do not start.  Lung cancer screening is recommended for adults aged 74-80 years who are at high risk for developing lung cancer because of a history of smoking. A yearly low-dose CT scan of the lungs is recommended for people who have at least a 30-pack-year history of smoking and are a current smoker or have quit within the past 15 years. A pack year of smoking is smoking an average of 1 pack of cigarettes a day  for 1 year (for example: 1 pack a day for 30 years or 2 packs a day for 15 years). Yearly screening should continue until the smoker has stopped smoking for at least 15 years. Yearly screening should be stopped for people who develop a health problem that would prevent them from having lung cancer treatment.  High blood pressure causes heart disease and increases the risk of stroke. Your blood pressure should be checked at least every 1 to 2 years. Ongoing high blood pressure should be treated with medicines if weight loss and exercise do not work.  If you are 1-46 years old, ask your health care provider if you should take aspirin to prevent strokes.  Diabetes screening involves taking a blood sample to check your fasting blood sugar level. This should be done once every 3 years, after age 66, if you are within normal weight and without risk factors for diabetes. Testing should be considered at a younger age or be carried out more frequently if you are overweight and have at least 1 risk factor for diabetes.  Breast cancer screening is essential preventive care for women. You should practice "breast self-awareness."  This means understanding the normal appearance and feel of your breasts and may include breast self-examination. Any changes detected, no matter how small, should be reported to a health care provider. Women in their 51s and 30s should have a clinical breast exam (CBE) by a health care provider as part of a regular health exam every 1 to 3 years. After age 3, women should have a CBE every year. Starting at age 40, women should consider having a mammogram (breast X-ray test) every year. Women who have a family history of breast cancer should talk to their health care provider about genetic screening. Women at a high risk of breast cancer should talk to their health care providers about having an MRI and a mammogram every year.  Breast cancer gene (BRCA)-related cancer risk assessment is recommended for women who have family members with BRCA-related cancers. BRCA-related cancers include breast, ovarian, tubal, and peritoneal cancers. Having family members with these cancers may be associated with an increased risk for harmful changes (mutations) in the breast cancer genes BRCA1 and BRCA2. Results of the assessment will determine the need for genetic counseling and BRCA1 and BRCA2 testing.  Routine pelvic exams to screen for cancer are no longer recommended for nonpregnant women who are considered low risk for cancer of the pelvic organs (ovaries, uterus, and vagina) and who do not have symptoms. Ask your health care provider if a screening pelvic exam is right for you.  If you have had past treatment for cervical cancer or a condition that could lead to cancer, you need Pap tests and screening for cancer for at least 20 years after your treatment. If Pap tests have been discontinued, your risk factors (such as having a new sexual partner) need to be reassessed to determine if screening should be resumed. Some women have medical problems that increase the chance of getting cervical cancer. In these cases, your  health care provider may recommend more frequent screening and Pap tests.  Colorectal cancer can be detected and often prevented. Most routine colorectal cancer screening begins at the age of 61 years and continues through age 26 years. However, your health care provider may recommend screening at an earlier age if you have risk factors for colon cancer. On a yearly basis, your health care provider may provide home test kits to check  for hidden blood in the stool. Use of a small camera at the end of a tube, to directly examine the colon (sigmoidoscopy or colonoscopy), can detect the earliest forms of colorectal cancer. Talk to your health care provider about this at age 50, when routine screening begins.  Direct exam of the colon should be repeated every 5-10 years through age 75 years, unless early forms of pre-cancerous polyps or small growths are found.  Hepatitis C blood testing is recommended for all people born from 1945 through 1965 and any individual with known risks for hepatitis C.  Pra  Osteoporosis is a disease in which the bones lose minerals and strength with aging. This can result in serious bone fractures or breaks. The risk of osteoporosis can be identified using a bone density scan. Women ages 65 years and over and women at risk for fractures or osteoporosis should discuss screening with their health care providers. Ask your health care provider whether you should take a calcium supplement or vitamin D to reduce the rate of osteoporosis.  Menopause can be associated with physical symptoms and risks. Hormone replacement therapy is available to decrease symptoms and risks. You should talk to your health care provider about whether hormone replacement therapy is right for you.  Use sunscreen. Apply sunscreen liberally and repeatedly throughout the day. You should seek shade when your shadow is shorter than you. Protect yourself by wearing long sleeves, pants, a wide-brimmed hat, and  sunglasses year round, whenever you are outdoors.  Once a month, do a whole body skin exam, using a mirror to look at the skin on your back. Tell your health care provider of new moles, moles that have irregular borders, moles that are larger than a pencil eraser, or moles that have changed in shape or color.  Stay current with required vaccines (immunizations).  Influenza vaccine. All adults should be immunized every year.  Tetanus, diphtheria, and acellular pertussis (Td, Tdap) vaccine. Pregnant women should receive 1 dose of Tdap vaccine during each pregnancy. The dose should be obtained regardless of the length of time since the last dose. Immunization is preferred during the 27th-36th week of gestation. An adult who has not previously received Tdap or who does not know her vaccine status should receive 1 dose of Tdap. This initial dose should be followed by tetanus and diphtheria toxoids (Td) booster doses every 10 years. Adults with an unknown or incomplete history of completing a 3-dose immunization series with Td-containing vaccines should begin or complete a primary immunization series including a Tdap dose. Adults should receive a Td booster every 10 years.  Varicella vaccine. An adult without evidence of immunity to varicella should receive 2 doses or a second dose if she has previously received 1 dose. Pregnant females who do not have evidence of immunity should receive the first dose after pregnancy. This first dose should be obtained before leaving the health care facility. The second dose should be obtained 4-8 weeks after the first dose.  Human papillomavirus (HPV) vaccine. Females aged 13-26 years who have not received the vaccine previously should obtain the 3-dose series. The vaccine is not recommended for use in pregnant females. However, pregnancy testing is not needed before receiving a dose. If a female is found to be pregnant after receiving a dose, no treatment is needed. In that  case, the remaining doses should be delayed until after the pregnancy. Immunization is recommended for any person with an immunocompromised condition through the age of 26 years   if she did not get any or all doses earlier. During the 3-dose series, the second dose should be obtained 4-8 weeks after the first dose. The third dose should be obtained 24 weeks after the first dose and 16 weeks after the second dose.  Zoster vaccine. One dose is recommended for adults aged 60 years or older unless certain conditions are present.  Measles, mumps, and rubella (MMR) vaccine. Adults born before 1957 generally are considered immune to measles and mumps. Adults born in 1957 or later should have 1 or more doses of MMR vaccine unless there is a contraindication to the vaccine or there is laboratory evidence of immunity to each of the three diseases. A routine second dose of MMR vaccine should be obtained at least 28 days after the first dose for students attending postsecondary schools, health care workers, or international travelers. People who received inactivated measles vaccine or an unknown type of measles vaccine during 1963-1967 should receive 2 doses of MMR vaccine. People who received inactivated mumps vaccine or an unknown type of mumps vaccine before 1979 and are at high risk for mumps infection should consider immunization with 2 doses of MMR vaccine. For females of childbearing age, rubella immunity should be determined. If there is no evidence of immunity, females who are not pregnant should be vaccinated. If there is no evidence of immunity, females who are pregnant should delay immunization until after pregnancy. Unvaccinated health care workers born before 1957 who lack laboratory evidence of measles, mumps, or rubella immunity or laboratory confirmation of disease should consider measles and mumps immunization with 2 doses of MMR vaccine or rubella immunization with 1 dose of MMR vaccine.  Pneumococcal  13-valent conjugate (PCV13) vaccine. When indicated, a person who is uncertain of her immunization history and has no record of immunization should receive the PCV13 vaccine. An adult aged 19 years or older who has certain medical conditions and has not been previously immunized should receive 1 dose of PCV13 vaccine. This PCV13 should be followed with a dose of pneumococcal polysaccharide (PPSV23) vaccine. The PPSV23 vaccine dose should be obtained at least 1 or more year(s) after the dose of PCV13 vaccine. An adult aged 19 years or older who has certain medical conditions and previously received 1 or more doses of PPSV23 vaccine should receive 1 dose of PCV13. The PCV13 vaccine dose should be obtained 1 or more years after the last PPSV23 vaccine dose.    Pneumococcal polysaccharide (PPSV23) vaccine. When PCV13 is also indicated, PCV13 should be obtained first. All adults aged 65 years and older should be immunized. An adult younger than age 65 years who has certain medical conditions should be immunized. Any person who resides in a nursing home or long-term care facility should be immunized. An adult smoker should be immunized. People with an immunocompromised condition and certain other conditions should receive both PCV13 and PPSV23 vaccines. People with human immunodeficiency virus (HIV) infection should be immunized as soon as possible after diagnosis. Immunization during chemotherapy or radiation therapy should be avoided. Routine use of PPSV23 vaccine is not recommended for American Indians, [place] Natives, or people younger than 65 years unless there are medical conditions that require PPSV23 vaccine. When indicated, people who have unknown immunization and have no record of immunization should receive PPSV23 vaccine. One-time revaccination 5 years after the first dose of PPSV23 is recommended for people aged 19-64 years who have chronic kidney failure, nephrotic syndrome, asplenia, or  immunocompromised conditions. People who received 1-2   doses of PPSV23 before age 65 years should receive another dose of PPSV23 vaccine at age 65 years or later if at least 5 years have passed since the previous dose. Doses of PPSV23 are not needed for people immunized with PPSV23 at or after age 65 years.  Preventive Services / Frequency   Ages 40 to 64 years  Blood pressure check.  Lipid and cholesterol check.  Lung cancer screening. / Every year if you are aged 55-80 years and have a 30-pack-year history of smoking and currently smoke or have quit within the past 15 years. Yearly screening is stopped once you have quit smoking for at least 15 years or develop a health problem that would prevent you from having lung cancer treatment.  Clinical breast exam.** / Every year after age 40 years.   BRCA-related cancer risk assessment.** / For women who have family members with a BRCA-related cancer (breast, ovarian, tubal, or peritoneal cancers).  Mammogram.** / Every year beginning at age 40 years and continuing for as long as you are in good health. Consult with your health care provider.  Pap test.** / Every 3 years starting at age 30 years through age 65 or 70 years with a history of 3 consecutive normal Pap tests.  HPV screening.** / Every 3 years from ages 30 years through ages 65 to 70 years with a history of 3 consecutive normal Pap tests.  Fecal occult blood test (FOBT) of stool. / Every year beginning at age 50 years and continuing until age 75 years. You may not need to do this test if you get a colonoscopy every 10 years.  Flexible sigmoidoscopy or colonoscopy.** / Every 5 years for a flexible sigmoidoscopy or every 10 years for a colonoscopy beginning at age 50 years and continuing until age 75 years.  Hepatitis C blood test.** / For all people born from 1945 through 1965 and any individual with known risks for hepatitis C.  Skin self-exam. / Monthly.  Influenza vaccine. /  Every year.  Tetanus, diphtheria, and acellular pertussis (Tdap/Td) vaccine.** / Consult your health care provider. Pregnant women should receive 1 dose of Tdap vaccine during each pregnancy. 1 dose of Td every 10 years.  Varicella vaccine.** / Consult your health care provider. Pregnant females who do not have evidence of immunity should receive the first dose after pregnancy.  Zoster vaccine.** / 1 dose for adults aged 60 years or older.  Pneumococcal 13-valent conjugate (PCV13) vaccine.** / Consult your health care provider.  Pneumococcal polysaccharide (PPSV23) vaccine.** / 1 to 2 doses if you smoke cigarettes or if you have certain conditions.  Meningococcal vaccine.** / Consult your health care provider.  Hepatitis A vaccine.** / Consult your health care provider.  Hepatitis B vaccine.** / Consult your health care provider. Screening for abdominal aortic aneurysm (AAA)  by ultrasound is recommended for people over 50 who have history of high blood pressure or who are current or former smokers. ++++++++++++++++++ Recommend Adult Low Dose Aspirin or  coated  Aspirin 81 mg daily  To reduce risk of Colon Cancer 40 %,  Skin Cancer 26 % ,  Melanoma 46%  and  Pancreatic cancer 60% +++++++++++++++++++ Vitamin D goal  is between 70-100.  Please make sure that you are taking your Vitamin D as directed.  It is very important as a natural anti-inflammatory  helping hair, skin, and nails, as well as reducing stroke and heart attack risk.  It helps your bones and helps with mood. It   also decreases numerous cancer risks so please take it as directed.  Low Vit D is associated with a 200-300% higher risk for CANCER  and 200-300% higher risk for HEART   ATTACK  &  STROKE.   .....................................Marland Kitchen It is also associated with higher death rate at younger ages,  autoimmune diseases like Rheumatoid arthritis, Lupus, Multiple Sclerosis.    Also many other serious conditions, like  depression, Alzheimer's Dementia, infertility, muscle aches, fatigue, fibromyalgia - just to name a few. ++++++++++++++++++ Recommend the book "The END of DIETING" by Dr Excell Seltzer  & the book "The END of DIABETES " by Dr Excell Seltzer At [email or web address] - get book & Audio CD's    Being diabetic has a  300% increased risk for heart attack, stroke, cancer, and alzheimer- type vascular dementia. It is very important that you work harder with diet by avoiding all foods that are white. Avoid white rice (brown & wild rice is OK), white potatoes (sweetpotatoes in moderation is OK), White bread or wheat bread or anything made out of white flour like bagels, donuts, rolls, buns, biscuits, cakes, pastries, cookies, pizza crust, and pasta (made from white flour & egg whites) - vegetarian pasta or spinach or wheat pasta is OK. Multigrain breads like Arnold's or [employer], or multigrain sandwich thins or flatbreads.  Diet, exercise and weight loss can reverse and cure diabetes in the early stages.  Diet, exercise and weight loss is very important in the control and prevention of complications of diabetes which affects every system in your body, ie. Brain - dementia/stroke, eyes - glaucoma/blindness, heart - heart attack/heart failure, kidneys - dialysis, stomach - gastric paralysis, intestines - malabsorption, nerves - severe painful neuritis, circulation - gangrene & loss of a leg(s), and finally cancer and Alzheimers.    I recommend avoid fried & greasy foods,  sweets/candy, white rice (brown or wild rice or Quinoa is OK), white potatoes (sweet potatoes are OK) - anything made from white flour - bagels, doughnuts, rolls, buns, biscuits,white and wheat breads, pizza crust and traditional pasta made of white flour & egg white(vegetarian pasta or spinach or wheat pasta is OK).  Multi-grain bread is OK - like multi-grain flat bread or sandwich thins. Avoid alcohol in excess. Exercise is also important.    Eat all the  vegetables you want - avoid meat, especially red meat and dairy - especially cheese.  Cheese is the most concentrated form of trans-fats which is the worst thing to clog up our arteries. Veggie cheese is OK which can be found in the fresh produce section at [employer] or [employer] or [employer]  ++++++++++++++++++++++ DASH Eating Plan  DASH stands for "Dietary Approaches to Stop Hypertension."   The DASH eating plan is a healthy eating plan that has been shown to reduce high blood pressure (hypertension). Additional health benefits may include reducing the risk of type 2 diabetes mellitus, heart disease, and stroke. The DASH eating plan may also help with weight loss. WHAT DO I NEED TO KNOW ABOUT THE DASH EATING PLAN? For the DASH eating plan, you will follow these general guidelines:  Choose foods with a percent daily value for sodium of less than 5% (as listed on the food label).  Use salt-free seasonings or herbs instead of table salt or sea salt.  Check with your health care provider or pharmacist before using salt substitutes.  Eat lower-sodium products, often labeled as "lower sodium" or "no salt added."  Eat fresh foods.  Eat  more vegetables, fruits, and low-fat dairy products.  Choose whole grains. Look for the word "whole" as the first word in the ingredient list.  Choose fish   Limit sweets, desserts, sugars, and sugary drinks.  Choose heart-healthy fats.  Eat veggie cheese   Eat more home-cooked food and less restaurant, buffet, and fast food.  Limit fried foods.  Cook foods using methods other than frying.  Limit canned vegetables. If you do use them, rinse them well to decrease the sodium.  When eating at a restaurant, ask that your food be prepared with less salt, or no salt if possible.                      WHAT FOODS CAN I EAT? Read Dr Fara Olden Fuhrman's books on The End of Dieting & The End of Diabetes  Grains Whole grain or whole wheat bread. Brown  rice. Whole grain or whole wheat pasta. Quinoa, bulgur, and whole grain cereals. Low-sodium cereals. Corn or whole wheat flour tortillas. Whole grain cornbread. Whole grain crackers. Low-sodium crackers.  Vegetables Fresh or frozen vegetables (raw, steamed, roasted, or grilled). Low-sodium or reduced-sodium tomato and vegetable juices. Low-sodium or reduced-sodium tomato sauce and paste. Low-sodium or reduced-sodium canned vegetables.   Fruits All fresh, canned (in natural juice), or frozen fruits.  Protein Products  All fish and seafood.  Dried beans, peas, or lentils. Unsalted nuts and seeds. Unsalted canned beans.  Dairy Low-fat dairy products, such as skim or 1% milk, 2% or reduced-fat cheeses, low-fat ricotta or cottage cheese, or plain low-fat yogurt. Low-sodium or reduced-sodium cheeses.  Fats and Oils Tub margarines without trans fats. Light or reduced-fat mayonnaise and salad dressings (reduced sodium). Avocado. Safflower, olive, or canola oils. Natural peanut or almond butter.  Other Unsalted popcorn and pretzels. The items listed above may not be a complete list of recommended foods or beverages. Contact your dietitian for more options.  ++++++++++++++++++  WHAT FOODS ARE NOT RECOMMENDED? Grains/ White flour or wheat flour White bread. White pasta. White rice. Refined cornbread. Bagels and croissants. Crackers that contain trans fat.  Vegetables  Creamed or fried vegetables. Vegetables in a . Regular canned vegetables. Regular canned tomato sauce and paste. Regular tomato and vegetable juices.  Fruits Dried fruits. Canned fruit in light or heavy syrup. Fruit juice.  Meat and Other Protein Products Meat in general - RED meat & White meat.  Fatty cuts of meat. Ribs, chicken wings, all processed meats as bacon, sausage, bologna, salami, fatback, hot dogs, bratwurst and packaged luncheon meats.  Dairy Whole or 2% milk, cream, half-and-half, and cream cheese. Whole-fat or  sweetened yogurt. Full-fat cheeses or blue cheese. Non-dairy creamers and whipped toppings. Processed cheese, cheese spreads, or cheese curds.  Condiments Onion and garlic salt, seasoned salt, table salt, and sea salt. Canned and packaged gravies. Worcestershire sauce. Tartar sauce. Barbecue sauce. Teriyaki sauce. Soy sauce, including reduced sodium. Steak sauce. Fish sauce. Oyster sauce. Cocktail sauce. Horseradish. Ketchup and mustard. Meat flavorings and tenderizers. Bouillon cubes. Hot sauce. Tabasco sauce. Marinades. Taco seasonings. Relishes.  Fats and Oils Butter, stick margarine, lard, shortening and bacon fat. Coconut, palm kernel, or palm oils. Regular salad dressings.  Pickles and olives. Salted popcorn and pretzels.  The items listed above may not be a complete list of foods and beverages to avoid.

## 2018-12-24 ENCOUNTER — Ambulatory Visit (INDEPENDENT_AMBULATORY_CARE_PROVIDER_SITE_OTHER): Payer: BC Managed Care – PPO | Admitting: Internal Medicine

## 2018-12-24 ENCOUNTER — Other Ambulatory Visit: Payer: Self-pay

## 2018-12-24 VITALS — BP 116/88 | HR 68 | Temp 97.0°F | Resp 16 | Ht 69.0 in | Wt 149.2 lb

## 2018-12-24 DIAGNOSIS — K2100 Gastro-esophageal reflux disease with esophagitis, without bleeding: Secondary | ICD-10-CM

## 2018-12-24 DIAGNOSIS — E782 Mixed hyperlipidemia: Secondary | ICD-10-CM

## 2018-12-24 DIAGNOSIS — Z111 Encounter for screening for respiratory tuberculosis: Secondary | ICD-10-CM

## 2018-12-24 DIAGNOSIS — R5383 Other fatigue: Secondary | ICD-10-CM

## 2018-12-24 DIAGNOSIS — R0989 Other specified symptoms and signs involving the circulatory and respiratory systems: Secondary | ICD-10-CM

## 2018-12-24 DIAGNOSIS — E559 Vitamin D deficiency, unspecified: Secondary | ICD-10-CM

## 2018-12-24 DIAGNOSIS — Z136 Encounter for screening for cardiovascular disorders: Secondary | ICD-10-CM | POA: Diagnosis not present

## 2018-12-24 DIAGNOSIS — Z8249 Family history of ischemic heart disease and other diseases of the circulatory system: Secondary | ICD-10-CM

## 2018-12-24 DIAGNOSIS — R7309 Other abnormal glucose: Secondary | ICD-10-CM | POA: Diagnosis not present

## 2018-12-24 DIAGNOSIS — Z0001 Encounter for general adult medical examination with abnormal findings: Secondary | ICD-10-CM

## 2018-12-24 DIAGNOSIS — K581 Irritable bowel syndrome with constipation: Secondary | ICD-10-CM

## 2018-12-24 DIAGNOSIS — Z1212 Encounter for screening for malignant neoplasm of rectum: Secondary | ICD-10-CM

## 2018-12-24 DIAGNOSIS — Z79899 Other long term (current) drug therapy: Secondary | ICD-10-CM

## 2018-12-24 DIAGNOSIS — N183 Chronic kidney disease, stage 3 unspecified: Secondary | ICD-10-CM

## 2018-12-24 DIAGNOSIS — Z1211 Encounter for screening for malignant neoplasm of colon: Secondary | ICD-10-CM

## 2018-12-25 LAB — CBC WITH DIFFERENTIAL/PLATELET
Absolute Monocytes: 322 cells/uL (ref 200–950)
Basophils Absolute: 37 cells/uL (ref 0–200)
Basophils Relative: 0.6 %
Eosinophils Absolute: 87 cells/uL (ref 15–500)
Eosinophils Relative: 1.4 %
HCT: 39.4 % (ref 35.0–45.0)
Hemoglobin: 13.5 g/dL (ref 11.7–15.5)
Lymphs Abs: 2096 cells/uL (ref 850–3900)
MCH: 30.3 pg (ref 27.0–33.0)
MCHC: 34.3 g/dL (ref 32.0–36.0)
MCV: 88.5 fL (ref 80.0–100.0)
MPV: 10.2 fL (ref 7.5–12.5)
Monocytes Relative: 5.2 %
Neutro Abs: 3658 cells/uL (ref 1500–7800)
Neutrophils Relative %: 59 %
Platelets: 298 10*3/uL (ref 140–400)
RBC: 4.45 10*6/uL (ref 3.80–5.10)
RDW: 12.1 % (ref 11.0–15.0)
Total Lymphocyte: 33.8 %
WBC: 6.2 10*3/uL (ref 3.8–10.8)

## 2018-12-25 LAB — COMPLETE METABOLIC PANEL WITH GFR
AG Ratio: 1.8 (calc) (ref 1.0–2.5)
ALT: 10 U/L (ref 6–29)
AST: 14 U/L (ref 10–35)
Albumin: 4.5 g/dL (ref 3.6–5.1)
Alkaline phosphatase (APISO): 76 U/L (ref 37–153)
BUN/Creatinine Ratio: 12 (calc) (ref 6–22)
BUN: 16 mg/dL (ref 7–25)
CO2: 26 mmol/L (ref 20–32)
Calcium: 9.7 mg/dL (ref 8.6–10.4)
Chloride: 103 mmol/L (ref 98–110)
Creat: 1.33 mg/dL — ABNORMAL HIGH (ref 0.50–1.05)
GFR, Est African American: 52 mL/min/{1.73_m2} — ABNORMAL LOW (ref >=60)
GFR, Est Non African American: 45 mL/min/{1.73_m2} — ABNORMAL LOW (ref >=60)
Globulin: 2.5 g/dL (calc) (ref 1.9–3.7)
Glucose, Bld: 98 mg/dL (ref 65–99)
Potassium: 4.6 mmol/L (ref 3.5–5.3)
Sodium: 140 mmol/L (ref 135–146)
Total Bilirubin: 0.4 mg/dL (ref 0.2–1.2)
Total Protein: 7 g/dL (ref 6.1–8.1)

## 2018-12-25 LAB — URINALYSIS, ROUTINE W REFLEX MICROSCOPIC
Bilirubin Urine: NEGATIVE
Glucose, UA: NEGATIVE
Hgb urine dipstick: NEGATIVE
Ketones, ur: NEGATIVE
Leukocytes,Ua: NEGATIVE
Nitrite: NEGATIVE
Protein, ur: NEGATIVE
Specific Gravity, Urine: 1.03 (ref 1.001–1.03)
pH: 5 (ref 5.0–8.0)

## 2018-12-25 LAB — LIPID PANEL
Cholesterol: 179 mg/dL (ref <200)
HDL: 65 mg/dL (ref >=50)
LDL Cholesterol (Calc): 90 mg/dL (calc)
Non-HDL Cholesterol (Calc): 114 mg/dL (calc) (ref <130)
Total CHOL/HDL Ratio: 2.8 (calc) (ref <5.0)
Triglycerides: 142 mg/dL (ref <150)

## 2018-12-25 LAB — IRON, TOTAL/TOTAL IRON BINDING CAP
%SAT: 28 % (calc) (ref 16–45)
Iron: 85 ug/dL (ref 45–160)
TIBC: 303 mcg/dL (calc) (ref 250–450)

## 2018-12-25 LAB — MICROALBUMIN / CREATININE URINE RATIO
Creatinine, Urine: 414 mg/dL — ABNORMAL HIGH (ref 20–275)
Microalb Creat Ratio: 5 mcg/mg creat (ref <30)
Microalb, Ur: 1.9 mg/dL

## 2018-12-25 LAB — VITAMIN B12: Vitamin B-12: 564 pg/mL (ref 200–1100)

## 2018-12-25 LAB — MAGNESIUM: Magnesium: 2 mg/dL (ref 1.5–2.5)

## 2018-12-25 LAB — HEMOGLOBIN A1C
Hgb A1c MFr Bld: 5.5 % of total Hgb (ref <5.7)
Mean Plasma Glucose: 111 (calc)
eAG (mmol/L): 6.2 (calc)

## 2018-12-25 LAB — VITAMIN D 25 HYDROXY (VIT D DEFICIENCY, FRACTURES): Vit D, 25-Hydroxy: 44 ng/mL (ref 30–100)

## 2018-12-25 LAB — INSULIN, RANDOM: Insulin: 7.4 u[IU]/mL

## 2018-12-25 LAB — TSH: TSH: 2.15 mIU/L

## 2018-12-27 ENCOUNTER — Encounter: Payer: Self-pay | Admitting: Internal Medicine

## 2019-02-02 ENCOUNTER — Other Ambulatory Visit: Payer: Self-pay | Admitting: Internal Medicine

## 2019-02-13 ENCOUNTER — Other Ambulatory Visit: Payer: Self-pay | Admitting: Adult Health

## 2019-03-22 ENCOUNTER — Other Ambulatory Visit: Payer: Self-pay | Admitting: Internal Medicine

## 2019-03-22 DIAGNOSIS — K2100 Gastro-esophageal reflux disease with esophagitis, without bleeding: Secondary | ICD-10-CM

## 2019-03-31 DIAGNOSIS — Z1231 Encounter for screening mammogram for malignant neoplasm of breast: Secondary | ICD-10-CM | POA: Diagnosis not present

## 2019-03-31 DIAGNOSIS — Z803 Family history of malignant neoplasm of breast: Secondary | ICD-10-CM | POA: Diagnosis not present

## 2019-03-31 LAB — HM MAMMOGRAPHY

## 2019-04-08 ENCOUNTER — Encounter: Payer: Self-pay | Admitting: *Deleted

## 2019-04-15 ENCOUNTER — Other Ambulatory Visit: Payer: Self-pay

## 2019-04-15 ENCOUNTER — Ambulatory Visit (INDEPENDENT_AMBULATORY_CARE_PROVIDER_SITE_OTHER): Payer: BC Managed Care – PPO

## 2019-04-15 VITALS — Temp 97.7°F

## 2019-04-15 DIAGNOSIS — Z23 Encounter for immunization: Secondary | ICD-10-CM | POA: Diagnosis not present

## 2019-04-15 NOTE — Progress Notes (Signed)
Patient reports for LOW DOSE FLU.

## 2019-04-25 ENCOUNTER — Other Ambulatory Visit: Payer: Self-pay | Admitting: Internal Medicine

## 2019-04-25 DIAGNOSIS — G8929 Other chronic pain: Secondary | ICD-10-CM

## 2019-04-25 DIAGNOSIS — M545 Low back pain, unspecified: Secondary | ICD-10-CM

## 2019-04-25 MED ORDER — GABAPENTIN 100 MG PO CAPS: ORAL_CAPSULE | ORAL | 3 refills | Status: DC

## 2019-04-26 ENCOUNTER — Other Ambulatory Visit: Payer: Self-pay | Admitting: Internal Medicine

## 2019-04-26 DIAGNOSIS — M545 Low back pain, unspecified: Secondary | ICD-10-CM

## 2019-04-26 DIAGNOSIS — G8929 Other chronic pain: Secondary | ICD-10-CM

## 2019-04-26 MED ORDER — GABAPENTIN 100 MG PO CAPS: ORAL_CAPSULE | ORAL | 3 refills | Status: DC

## 2019-04-28 DIAGNOSIS — H903 Sensorineural hearing loss, bilateral: Secondary | ICD-10-CM | POA: Diagnosis not present

## 2019-04-28 DIAGNOSIS — H7203 Central perforation of tympanic membrane, bilateral: Secondary | ICD-10-CM | POA: Diagnosis not present

## 2019-04-28 DIAGNOSIS — J342 Deviated nasal septum: Secondary | ICD-10-CM | POA: Diagnosis not present

## 2019-04-28 DIAGNOSIS — H6983 Other specified disorders of Eustachian tube, bilateral: Secondary | ICD-10-CM | POA: Diagnosis not present

## 2019-05-18 ENCOUNTER — Other Ambulatory Visit: Payer: Self-pay | Admitting: Internal Medicine

## 2019-05-23 ENCOUNTER — Other Ambulatory Visit: Payer: Self-pay | Admitting: Internal Medicine

## 2019-05-23 MED ORDER — PHENTERMINE HCL 37.5 MG PO TABS: ORAL_TABLET | ORAL | 1 refills | Status: DC

## 2019-06-03 DIAGNOSIS — H04123 Dry eye syndrome of bilateral lacrimal glands: Secondary | ICD-10-CM | POA: Diagnosis not present

## 2019-06-16 NOTE — Progress Notes (Signed)
FOLLOW UP  Assessment and Plan:   Hypertension BP is well controlled off of medications at this time; continue to monitor130/80 Continue DASH diet.   Reminder to go to the ER if any CP, SOB, nausea, dizziness, severe HA, changes vision/speech, left arm numbness and tingling and jaw pain.  Cholesterol Currently at goal; continue statin Continue low cholesterol diet and exercise.  Check lipid panel.   BMI 20 Commended excellent progress with weight loss last year Continue to recommend diet heavy in fruits and veggies and low in animal meats, cheeses, and dairy products, appropriate calorie intake Discuss exercise recommendations routinely Continue to monitor weight at each visit  Vitamin D Def continue supplementation to maintain goal of 60-100 Defer Vit D level  Anxiety Well managed by current regimen; continue medications Stress management techniques discussed, increase water, good sleep hygiene discussed, increase exercise, and increase veggies.   GERD/Barrett's esophagus Well managed on current medications; continue PPI  Discussed diet, avoiding triggers and other lifestyle changes  Insomnia Recently well controlled with gabapentin/trazodone; rare PRN ativan Encourage good sleep hygiene, increase exercise/day time activity  CKD IIIa Increase fluids, avoid NSAIDS, monitor sugars, will monitor - CMP/GFR  Continue diet and meds as discussed. Further disposition pending results of labs. Discussed med's effects and SE's.   Over 30 minutes of exam, counseling, chart review, and critical decision making was performed.   Future Appointments  Date Time Provider [place]  02/15/2020  9:00 AM Unk Pinto, MD [facility] None    ----------------------------------------------------------------------------------------------------------------------  HPI 54 y.o. female  presents for 3 month follow up on hypertension, cholesterol, glucose management, weight and  vitamin D deficiency.   She has ongoing chronic insomnia and also chronic low back pain much improved with low dose Gabapentin. She has been prescribed trazodone 150 mg to take at night for sleep, and also has ativan 2 mg to use PRN (hasn't filled since 2018, uses rarely). She is also prescribed wellbutrin 300 mg and celexa 20 mg daily for mood/generalized anxiety and reports other than covid 19 she has doing well, "better than ever."   Dx of OSA, was seen by Dr. Brett Fairy in 04/2018 and recommended repeat sleep study but declined due to lack of clarity with cost, and recently sleeping better and waking up rested. RLS on last sleep study but she reports this is not bothering her.     She also has GERD with known barrett's esophagus with symptoms controlled on current meds (protonix 40 mg BID). Recent EGD in 11/2017 showed stable results. IBS- C/d, she reports well managed with linzess.   BMI is Body mass index is 20.82 kg/m., she has been working on diet and exercise, but going into busy season with taxes at work, phentermine has been very helpful to avoid regaining. Recently succesffully lost   Wt Readings from Last 3 Encounters:  06/18/19 141 lb (64 kg)  12/24/18 149 lb 3.2 oz (67.7 kg)  06/03/18 181 lb (82.1 kg)   Today their BP is BP: 106/68  She does workout. She denies chest pain, shortness of breath, dizziness.   She is on cholesterol medication Rosuvastatin 20 mg daily and denies myalgias. Her cholesterol is at goal. The cholesterol last visit was:   Lab Results  Component Value Date   CHOL 179 12/24/2018   HDL 65 12/24/2018   LDLCALC 90 12/24/2018   TRIG 142 12/24/2018   CHOLHDL 2.8 12/24/2018    She has been working on diet and exercise for glucose management, and  denies increased appetite, nausea, paresthesia of the feet, polydipsia, polyuria, visual disturbances and vomiting. Last A1C in the office was:  Lab Results  Component Value Date   HGBA1C 5.5 12/24/2018    She has  CKD IIIa monitored at this office  She reports rarely to never takes NSAIDs, did take regularly with remote back injury 10 years ago; denies ETOH; She reports 1 coke in AM, 30 fluid ounces of water and sweet tea.  Lab Results  Component Value Date   GFRNONAA 45 (L) 12/24/2018   Patient is on Vitamin D supplement.   Lab Results  Component Value Date   VD25OH 44 12/24/2018        Current Medications:  Current Outpatient Medications on File Prior to Visit  Medication Sig  . buPROPion (WELLBUTRIN XL) 300 MG 24 hr tablet Take 1 tablet every Morning for Mood, Focus& Concentration  . citalopram (CELEXA) 20 MG tablet Take 1 tablet Daily for Mood  . famotidine (PEPCID) 40 MG tablet Take 1 tablet (40 mg total) by mouth at bedtime.  . fluticasone (FLONASE) 50 MCG/ACT nasal spray Place 2 sprays into both nostrils as needed. Reported on 06/27/2015  . gabapentin (NEURONTIN) 100 MG capsule Take 1 to 2 capsules  2 to 3 x /day as needed for Chronic Back Pain  . linaclotide (LINZESS) 145 MCG CAPS capsule Take 1 capsule Daily for Chronic Constipation  . LORazepam (ATIVAN) 2 MG tablet Take 1 tablet (2 mg total) by mouth at bedtime. (Patient taking differently: Take 2 mg by mouth as needed. )  . pantoprazole (PROTONIX) 40 MG tablet Take 1 tablet 2 x /day to Prevent Indigestion & Heartburn  . phentermine (ADIPEX-P) 37.5 MG tablet Take 1 tablet every morning for Dieting & Weight  Loss  . rosuvastatin (CRESTOR) 20 MG tablet Take 1 tablet Daily for Cholesterol  . topiramate (TOPAMAX) 50 MG tablet Take 1 tablet 2 x /day at Suppertime & Bedtime for Dieting & Weight Loss  . traZODone (DESYREL) 150 MG tablet TAKE ONE TO TWO TABLETS BY MOUTH ONE HOUR BEFORE SLEEP  . valACYclovir (VALTREX) 500 MG tablet    No current facility-administered medications on file prior to visit.     Allergies:  Allergies  Allergen Reactions  . Atenolol Other (See Comments)    Made patient feel like a "zombie"  . Ambien [Zolpidem  Tartrate]     Memory issues  . Cholestatin     Hot flashes  . Mirapex [Pramipexole Dihydrochloride]     Hot flashes  . Requip [Ropinirole Hcl]     Hot flashes     Medical History:  Past Medical History:  Diagnosis Date  . Allergy    SEASONAL  . Anxiety   . Back injury   . Barrett's esophagus   . Chest pain, unspecified    normal myoview 2000;  echo 5/10:  mild LVH, EF 60-65%  . DDD (degenerative disc disease)   . Depression   . GERD (gastroesophageal reflux disease)   . Hiatal hernia   . HSV-1 (herpes simplex virus 1) infection   . Hyperlipidemia   . IBS (irritable bowel syndrome)   . Insomnia   . Lumbago   . Melanosis coli   . Palpitations    h/o symptomatic PVCs  . Palpitations   . Persistent disorder of initiating or maintaining sleep   . Restless legs syndrome (RLS)    Family history- Reviewed and unchanged Social history- Reviewed and unchanged   Review of Systems:  Review of Systems  Constitutional: Negative for malaise/fatigue and weight loss.  HENT: Negative for hearing loss and tinnitus.   Eyes: Negative for blurred vision and double vision.  Respiratory: Negative for cough, shortness of breath and wheezing.   Cardiovascular: Negative for chest pain, palpitations, orthopnea, claudication and leg swelling.  Gastrointestinal: Positive for constipation (managed by linzess). Negative for abdominal pain, blood in stool, diarrhea, heartburn, melena, nausea and vomiting.  Genitourinary: Negative.   Musculoskeletal: Negative for joint pain and myalgias.  Skin: Negative for rash.  Neurological: Negative for dizziness, tingling, sensory change, weakness and headaches.  Endo/Heme/Allergies: Negative for polydipsia.  Psychiatric/Behavioral: Negative for depression. The patient is not nervous/anxious and does not have insomnia.   All other systems reviewed and are negative.   Physical Exam: BP 106/68   Pulse 74   Temp (!) 96.4 F (35.8 C)   Ht 5\' 9"  (1.753 m)    Wt 141 lb (64 kg)   LMP 12/26/2015   SpO2 99%   BMI 20.82 kg/m  Wt Readings from Last 3 Encounters:  06/18/19 141 lb (64 kg)  12/24/18 149 lb 3.2 oz (67.7 kg)  06/03/18 181 lb (82.1 kg)   General Appearance: Well nourished, in no apparent distress. Eyes: PERRLA, EOMs, conjunctiva no swelling or erythema Sinuses: No Frontal/maxillary tenderness ENT/Mouth: Ext aud canals clear, TMs without erythema, bulging. Mask in place; oral exam deferred. Hearing normal.  Neck: Supple, thyroid normal.  Respiratory: Respiratory effort normal, BS equal bilaterally without rales, rhonchi, wheezing or stridor.  Cardio: RRR with no MRGs. Brisk peripheral pulses without edema.  Abdomen: Soft, + BS.  Non tender, no guarding, rebound, hernias, masses. Lymphatics: Non tender without lymphadenopathy.  Musculoskeletal: Full ROM, 5/5 strength, Normal gait Skin: Warm, dry without rashes, lesions, ecchymosis.  Neuro: Cranial nerves intact. No cerebellar symptoms.  Psych: Awake and oriented X 3, normal affect, Insight and Judgment appropriate.    Izora Ribas, NP 9:29 AM [facility]

## 2019-06-18 ENCOUNTER — Ambulatory Visit: Payer: BC Managed Care – PPO | Admitting: Adult Health

## 2019-06-18 ENCOUNTER — Other Ambulatory Visit: Payer: Self-pay | Admitting: Internal Medicine

## 2019-06-18 ENCOUNTER — Encounter: Payer: Self-pay | Admitting: Adult Health

## 2019-06-18 ENCOUNTER — Other Ambulatory Visit: Payer: Self-pay

## 2019-06-18 VITALS — BP 106/68 | HR 74 | Temp 96.4°F | Ht 69.0 in | Wt 141.0 lb

## 2019-06-18 DIAGNOSIS — K2100 Gastro-esophageal reflux disease with esophagitis, without bleeding: Secondary | ICD-10-CM | POA: Diagnosis not present

## 2019-06-18 DIAGNOSIS — Z79899 Other long term (current) drug therapy: Secondary | ICD-10-CM | POA: Diagnosis not present

## 2019-06-18 DIAGNOSIS — K22719 Barrett's esophagus with dysplasia, unspecified: Secondary | ICD-10-CM | POA: Diagnosis not present

## 2019-06-18 DIAGNOSIS — R0989 Other specified symptoms and signs involving the circulatory and respiratory systems: Secondary | ICD-10-CM | POA: Diagnosis not present

## 2019-06-18 DIAGNOSIS — E785 Hyperlipidemia, unspecified: Secondary | ICD-10-CM

## 2019-06-18 DIAGNOSIS — N1831 Chronic kidney disease, stage 3a: Secondary | ICD-10-CM | POA: Diagnosis not present

## 2019-06-18 DIAGNOSIS — R7309 Other abnormal glucose: Secondary | ICD-10-CM

## 2019-06-18 DIAGNOSIS — F411 Generalized anxiety disorder: Secondary | ICD-10-CM

## 2019-06-18 DIAGNOSIS — E559 Vitamin D deficiency, unspecified: Secondary | ICD-10-CM

## 2019-06-18 DIAGNOSIS — E663 Overweight: Secondary | ICD-10-CM | POA: Diagnosis not present

## 2019-06-18 DIAGNOSIS — F5103 Paradoxical insomnia: Secondary | ICD-10-CM

## 2019-06-18 LAB — CBC WITH DIFFERENTIAL/PLATELET
Absolute Monocytes: 333 cells/uL (ref 200–950)
Basophils Absolute: 50 cells/uL (ref 0–200)
Basophils Relative: 1.1 %
Eosinophils Absolute: 113 cells/uL (ref 15–500)
Eosinophils Relative: 2.5 %
HCT: 40.3 % (ref 35.0–45.0)
Hemoglobin: 13.8 g/dL (ref 11.7–15.5)
Lymphs Abs: 1791 cells/uL (ref 850–3900)
MCH: 30.5 pg (ref 27.0–33.0)
MCHC: 34.2 g/dL (ref 32.0–36.0)
MCV: 89.2 fL (ref 80.0–100.0)
MPV: 10.1 fL (ref 7.5–12.5)
Monocytes Relative: 7.4 %
Neutro Abs: 2214 cells/uL (ref 1500–7800)
Neutrophils Relative %: 49.2 %
Platelets: 258 10*3/uL (ref 140–400)
RBC: 4.52 10*6/uL (ref 3.80–5.10)
RDW: 12 % (ref 11.0–15.0)
Total Lymphocyte: 39.8 %
WBC: 4.5 10*3/uL (ref 3.8–10.8)

## 2019-06-18 LAB — TSH: TSH: 2.59 mIU/L

## 2019-06-18 LAB — COMPLETE METABOLIC PANEL WITH GFR
AG Ratio: 1.8 (calc) (ref 1.0–2.5)
ALT: 12 U/L (ref 6–29)
AST: 15 U/L (ref 10–35)
Albumin: 4.5 g/dL (ref 3.6–5.1)
Alkaline phosphatase (APISO): 57 U/L (ref 37–153)
BUN/Creatinine Ratio: 16 (calc) (ref 6–22)
BUN: 18 mg/dL (ref 7–25)
CO2: 29 mmol/L (ref 20–32)
Calcium: 9.8 mg/dL (ref 8.6–10.4)
Chloride: 105 mmol/L (ref 98–110)
Creat: 1.16 mg/dL — ABNORMAL HIGH (ref 0.50–1.05)
GFR, Est African American: 62 mL/min/{1.73_m2} (ref >=60)
GFR, Est Non African American: 53 mL/min/{1.73_m2} — ABNORMAL LOW (ref >=60)
Globulin: 2.5 g/dL (calc) (ref 1.9–3.7)
Glucose, Bld: 90 mg/dL (ref 65–99)
Potassium: 4.2 mmol/L (ref 3.5–5.3)
Sodium: 141 mmol/L (ref 135–146)
Total Bilirubin: 0.4 mg/dL (ref 0.2–1.2)
Total Protein: 7 g/dL (ref 6.1–8.1)

## 2019-06-18 LAB — LIPID PANEL
Cholesterol: 205 mg/dL — ABNORMAL HIGH (ref <200)
HDL: 88 mg/dL (ref >=50)
LDL Cholesterol (Calc): 101 mg/dL (calc) — ABNORMAL HIGH
Non-HDL Cholesterol (Calc): 117 mg/dL (calc) (ref <130)
Total CHOL/HDL Ratio: 2.3 (calc) (ref <5.0)
Triglycerides: 73 mg/dL (ref <150)

## 2019-06-18 LAB — MAGNESIUM: Magnesium: 2.2 mg/dL (ref 1.5–2.5)

## 2019-06-18 MED ORDER — VALACYCLOVIR HCL 500 MG PO TABS: ORAL_TABLET | ORAL | 3 refills | Status: AC

## 2019-06-18 MED ORDER — VALACYCLOVIR HCL 500 MG PO TABS: 500.0000 mg | ORAL_TABLET | ORAL | 0 refills | Status: DC | PRN

## 2019-06-18 NOTE — Patient Instructions (Addendum)
Goals    . DIET - INCREASE WATER INTAKE     65-80 fluid ounces of clear liquids daily       Preventing Chronic Kidney Disease Chronic kidney disease (CKD) occurs when the kidneys are damaged for at least 3 months and do not function effectively. The kidneys are two organs that do many important jobs in the body, including:  Removing waste and extra fluid from the blood to make urine.  Making hormones that maintain the amount of fluid in tissues and blood vessels.  Maintaining the right amount of fluids and electrolytes in the body. A small amount of kidney damage may not cause problems, but a large amount of damage may make it hard or impossible for the kidneys to work the way they should. CKD gets worse over time (is progressive). You can take steps to prevent CKD or to keep it from getting worse. The best way to prevent kidney damage is to know your risk factors and make changes before you develop symptoms of CKD. How can this condition affect me? At first, you may not notice any signs or symptoms of CKD. Symptoms develop slowly and may not be obvious until the kidney damage becomes severe. If steps are not taken to prevent or slow down the disease, CKD can lead to:  A low red blood cell count (anemia).  Heart disease.  Weak bones.  Nerve damage (neuropathy).  Stroke.  Kidney failure and dialysis. What can increase my risk? You are more likely to develop CKD if you:  Are 1 years of age or older.  Are female.  Are of African American, Native American, Hispanic, Asian, or [place] descent.  Are obese.  Have taken certain medicines for a long time.  Use tobacco or have used it in the past.  Have any of the following conditions: ? Diabetes. ? High blood pressure. ? Heart disease. ? Multiple myeloma. ? An autoimmune disease. ? Frequent urinary tract infections. ? Polycystic kidney disease.  Have a family history of kidney disease, heart disease, diabetes,  or high blood pressure.  Have problems with urine flow that may be caused by: ? Cancer. ? Having kidney stones more than once. ? An enlarged prostate in males. What actions can I take to prevent CKD? Managing conditions that put you at risk  Talk to your health care provider about your kidney health and your risk factors for CKD.  Work with your health care provider to manage conditions such as high blood pressure or diabetes. This may involve taking medicines, eating healthy, or making lifestyle changes to: ? Get high blood pressure down to the target that your health care provider recommends. ? Get blood sugar (glucose) levels down to the target that your health care provider recommends. Eating and drinking   Follow instructions from your health care provider about diet. This may include: ? Limiting salt (sodium) intake. You should have less than 1 tsp (2,300 mg) of sodium a day. If you have heart disease or high blood pressure, you should have less than  tsp (1,500 mg) of sodium a day. ? Limiting protein intake as told by your health care provider. Avoid high-protein foods. ? Eating a balanced, heart-healthy diet. ? Avoiding foods that are high in potassium and phosphorous.  Limit alcohol. If you drink alcohol: ? Limit how much you use to:  0-1 drink a day for nonpregnant women.  0-2 drinks a day for men. ? Be aware of how much alcohol is  in your drink. In the [place] one drink equals one 12 oz bottle of beer (355 mL), one 5 oz glass of wine (148 mL), or one 1 oz glass of hard liquor (44 mL).  If you have diabetes, work with a Financial planner (Firefighter) or a certified diabetes educator to develop a healthy eating plan.  Talk with your health care provider about how much fluid you should drink each day. Lifestyle   Exercise for at least 30 minutes on 5 or more days of the week, or as much as told by your health care provider.  Keep your weight at a healthy  level. If you are overweight or obese, lose weight as told by your health care provider.  Do not use any products that contain nicotine or tobacco, such as cigarettes, e-cigarettes, and chewing tobacco. If you need help quitting, ask your health care provider. General instructions  Take over-the-counter and prescription medicines only as told by your health care provider. Do not take any new medicines unless approved by your health care provider.  Use NSAIDs for pain only when necessary. Ask your health care provider about other pain medicines that do not increase your risk of developing CKD.  Have a yearly physical exam.  Learn about your family's medical history. Talk to your relatives and siblings about diabetes, heart disease, and high blood pressure. Where to find more information Learn more about CKD and how to prevent CKD from:  [place]: www.kidney.org  American Association of Kidney Patients: BombTimer.gl  American Diabetes Association: www.diabetes.org Summary  Symptoms of CKD develop slowly and may not be obvious until the kidney damage becomes severe.  The best way to prevent kidney damage is to know your risk factors and make nutrition and lifestyle changes before you develop symptoms of CKD.  Follow instructions from your health care provider about diet, which may include limiting how much salt, protein, and alcohol you consume.  Work with your health care provider to keep your blood pressure and blood sugar levels within the recommended range. This information is not intended to replace advice given to you by your health care provider. Make sure you discuss any questions you have with your health care provider. Document Revised: 09/04/2018 Document Reviewed: 06/17/2018 Elsevier Patient Education  [place].

## 2019-06-22 ENCOUNTER — Other Ambulatory Visit: Payer: Self-pay | Admitting: *Deleted

## 2019-06-22 MED ORDER — LINACLOTIDE 145 MCG PO CAPS: ORAL_CAPSULE | ORAL | 0 refills | Status: DC

## 2019-08-11 ENCOUNTER — Telehealth: Payer: Self-pay | Admitting: *Deleted

## 2019-08-11 NOTE — Telephone Encounter (Signed)
Linzess has been approved 06/22/2019 to 06/20/2020.

## 2019-10-18 ENCOUNTER — Other Ambulatory Visit: Payer: Self-pay

## 2019-10-18 ENCOUNTER — Ambulatory Visit: Payer: BC Managed Care – PPO | Admitting: Internal Medicine

## 2019-10-18 VITALS — BP 142/84 | HR 72 | Temp 96.7°F | Resp 16 | Ht 69.0 in | Wt 151.0 lb

## 2019-10-18 DIAGNOSIS — R5383 Other fatigue: Secondary | ICD-10-CM

## 2019-10-18 DIAGNOSIS — M545 Low back pain, unspecified: Secondary | ICD-10-CM

## 2019-10-18 DIAGNOSIS — G8929 Other chronic pain: Secondary | ICD-10-CM

## 2019-10-18 DIAGNOSIS — R300 Dysuria: Secondary | ICD-10-CM

## 2019-10-18 MED ORDER — TOPIRAMATE 100 MG PO TABS: ORAL_TABLET | ORAL | 1 refills | Status: DC

## 2019-10-18 MED ORDER — GABAPENTIN 300 MG PO CAPS: ORAL_CAPSULE | ORAL | 1 refills | Status: DC

## 2019-10-18 MED ORDER — CIPROFLOXACIN HCL 500 MG PO TABS: ORAL_TABLET | ORAL | 0 refills | Status: DC

## 2019-10-18 NOTE — Progress Notes (Signed)
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 Apparently she had to file a number of  tax extensions and clients are "unhappy".  She's having ongoing issues with Insomia & anxiety. In addition, she's developed recent sx's of dysuria, frequency & urgency.  Medications  .  rosuvastatin (CRESTOR) 20 MG tablet, Take 1 tablet Daily for Cholesterol .  fluticasone (FLONASE)  nasal spray, Place 2 sprays into both nostrils as needed. Marland Kitchen  buPROPion (WELLBUTRIN XL) 300 MG 24 hr tablet, Take 1 tablet every Morning for Mood, Focus& Concentration .  citalopram (CELEXA) 20 MG tablet, Take 1 tablet Daily for Mood .  gabapentin (NEURONTIN) 100 MG capsule, Take 1 to 2 capsules  2 to 3 x /day as needed for Chronic Back Pain .  linaclotide (LINZESS) 145 MCG CAPS capsule, Take 1 capsule Daily for Chronic Constipation .  pantoprazole 40 MG tablet, Take 1 tablet 2 x /day to Prevent Indigestion & Heartburn .  phentermine 37.5 MG tablet, Take 1 tablet every morning for Dieting & Weight  Loss .  topiramate 50 MG tablet, Take 1 tablet 2 x /day at Suppertime & Bedtime for Dieting & Weight Loss .  valACYclovir (VALTREX) 500 MG tablet, Take 1 tablet Daily prophylaxis for Cold Sores / Fever Blisters  Problem list She has OSA (obstructive sleep apnea); RESTLESS LEGS SYNDROME; HNP (herniated nucleus pulposus), lumbar; Hyperlipidemia; Lumbago; Insomnia; HSV-1 (herpes simplex virus 1) infection; DDD (degenerative disc disease); GERD (gastroesophageal reflux disease); Barrett's esophagus; Vitamin D deficiency; Abnormal glucose; Medication management; IBS (irritable colon syndrome); Labile hypertension; Memory loss; Chronic daily headache; Generalized anxiety disorder; CKD (chronic kidney disease) stage  3, GFR 30-59 ml/min; Overweight (BMI 25.0-29.9); and Chronic constipation on their problem list.   Observations/Objective:   BP (!) 142/84   Pulse 72   Temp (!) 96.7 F (35.9 C)   Resp 16   Ht 5\' 9"  (1.753 m)   Wt 151 lb (68.5 kg)   LMP 12/26/2015   BMI 22.30 kg/m   HEENT - WNL. Neck - supple.  Chest - Clear equal BS. Cor - Nl HS. RRR w/o sig MGR. PP 1(+). No edema. MS- FROM w/o deformities.  Gait Nl. Neuro -  Nl w/o focal abnormalities.Anxious & occasionally tearful  Assessment and Plan:  1. Fatigue, unspecified type  - Urine Culture - CBC with Differential/Platelet - TSH - COMPLETE METABOLIC PANEL WITH GFR  2. Dysuria  - Urinalysis, Routine w reflex microscopic - Urine Culture - ciprofloxacin (CIPRO) 500 MG tablet; Take 1 tablet   2 x /day with Food for Infection  Dispense: 20 tablet; Refill: 0  3. Chronic midline low back pain without sciatica  - gabapentin (NEURONTIN) 300 MG capsule; Take 1  capsules  2 to 3 x /day as needed for Chronic Back Pain  Dispense: 270 capsule; Refill: 1  - Discussed meds & SE's & rec f/u in 1 month.      I discussed the assessment and treatment plan with the patient. The patient was provided an opportunity to ask questions and all were answered. The patient agreed with the plan and demonstrated an understanding of the instructions.       The patient was advised to call back or seek an in-person evaluation  if the symptoms worsen or if the condition fails to improve as anticipated.   Kirtland Bouchard, MD

## 2019-10-20 ENCOUNTER — Other Ambulatory Visit: Payer: Self-pay | Admitting: Internal Medicine

## 2019-10-20 DIAGNOSIS — N3000 Acute cystitis without hematuria: Secondary | ICD-10-CM

## 2019-10-20 LAB — URINALYSIS, ROUTINE W REFLEX MICROSCOPIC
Bacteria, UA: NONE SEEN /HPF
Bilirubin Urine: NEGATIVE
Glucose, UA: NEGATIVE
Hyaline Cast: NONE SEEN /LPF
Ketones, ur: NEGATIVE
Nitrite: POSITIVE — AB
Specific Gravity, Urine: 1.011 (ref 1.001–1.03)
Squamous Epithelial / HPF: NONE SEEN /HPF (ref <=5)
pH: <=5 (ref 5.0–8.0)

## 2019-10-20 LAB — COMPLETE METABOLIC PANEL WITH GFR
AG Ratio: 1.7 (calc) (ref 1.0–2.5)
ALT: 13 U/L (ref 6–29)
AST: 19 U/L (ref 10–35)
Albumin: 4.3 g/dL (ref 3.6–5.1)
Alkaline phosphatase (APISO): 65 U/L (ref 37–153)
BUN/Creatinine Ratio: 17 (calc) (ref 6–22)
BUN: 21 mg/dL (ref 7–25)
CO2: 28 mmol/L (ref 20–32)
Calcium: 9.2 mg/dL (ref 8.6–10.4)
Chloride: 101 mmol/L (ref 98–110)
Creat: 1.21 mg/dL — ABNORMAL HIGH (ref 0.50–1.05)
GFR, Est African American: 59 mL/min/{1.73_m2} — ABNORMAL LOW (ref >=60)
GFR, Est Non African American: 51 mL/min/{1.73_m2} — ABNORMAL LOW (ref >=60)
Globulin: 2.5 g/dL (calc) (ref 1.9–3.7)
Glucose, Bld: 107 mg/dL — ABNORMAL HIGH (ref 65–99)
Potassium: 4.4 mmol/L (ref 3.5–5.3)
Sodium: 136 mmol/L (ref 135–146)
Total Bilirubin: 0.4 mg/dL (ref 0.2–1.2)
Total Protein: 6.8 g/dL (ref 6.1–8.1)

## 2019-10-20 LAB — CBC WITH DIFFERENTIAL/PLATELET
Absolute Monocytes: 361 cells/uL (ref 200–950)
Basophils Absolute: 77 cells/uL (ref 0–200)
Basophils Relative: 0.9 %
Eosinophils Absolute: 138 cells/uL (ref 15–500)
Eosinophils Relative: 1.6 %
HCT: 40.7 % (ref 35.0–45.0)
Hemoglobin: 13.7 g/dL (ref 11.7–15.5)
Lymphs Abs: 1780 cells/uL (ref 850–3900)
MCH: 30.2 pg (ref 27.0–33.0)
MCHC: 33.7 g/dL (ref 32.0–36.0)
MCV: 89.8 fL (ref 80.0–100.0)
MPV: 10.1 fL (ref 7.5–12.5)
Monocytes Relative: 4.2 %
Neutro Abs: 6244 cells/uL (ref 1500–7800)
Neutrophils Relative %: 72.6 %
Platelets: 263 10*3/uL (ref 140–400)
RBC: 4.53 10*6/uL (ref 3.80–5.10)
RDW: 11.8 % (ref 11.0–15.0)
Total Lymphocyte: 20.7 %
WBC: 8.6 10*3/uL (ref 3.8–10.8)

## 2019-10-20 LAB — URINE CULTURE
MICRO NUMBER:: 10512830
SPECIMEN QUALITY:: ADEQUATE

## 2019-10-20 LAB — TSH: TSH: 2.48 mIU/L

## 2019-10-20 MED ORDER — NITROFURANTOIN MONOHYD MACRO 100 MG PO CAPS: ORAL_CAPSULE | ORAL | 0 refills | Status: DC

## 2019-10-21 ENCOUNTER — Ambulatory Visit: Payer: BC Managed Care – PPO | Admitting: Internal Medicine

## 2019-10-22 ENCOUNTER — Other Ambulatory Visit: Payer: Self-pay | Admitting: Internal Medicine

## 2019-10-22 DIAGNOSIS — G4700 Insomnia, unspecified: Secondary | ICD-10-CM

## 2019-11-04 ENCOUNTER — Other Ambulatory Visit: Payer: Self-pay | Admitting: Internal Medicine

## 2019-11-04 DIAGNOSIS — N3000 Acute cystitis without hematuria: Secondary | ICD-10-CM

## 2019-11-04 MED ORDER — CEFUROXIME AXETIL 250 MG PO TABS: 250.0000 mg | ORAL_TABLET | Freq: Two times a day (BID) | ORAL | 0 refills | Status: DC

## 2019-11-12 ENCOUNTER — Other Ambulatory Visit: Payer: Self-pay | Admitting: Internal Medicine

## 2019-11-18 ENCOUNTER — Ambulatory Visit: Payer: BC Managed Care – PPO | Admitting: Internal Medicine

## 2019-11-21 ENCOUNTER — Other Ambulatory Visit: Payer: Self-pay | Admitting: Internal Medicine

## 2019-11-21 MED ORDER — PHENTERMINE HCL 37.5 MG PO TABS: ORAL_TABLET | ORAL | 1 refills | Status: DC

## 2019-12-10 NOTE — Progress Notes (Deleted)
Assessment and Plan:  There are no diagnoses linked to this encounter.    Further disposition pending results of labs. Discussed med's effects and SE's.   Over 30 minutes of exam, counseling, chart review, and critical decision making was performed.   Future Appointments  Date Time Provider [place]  12/13/2019  4:00 PM Unk Pinto, MD [facility] None  02/15/2020  9:00 AM Unk Pinto, MD [facility] None    ------------------------------------------------------------------------------------------------------------------   HPI LMP 12/26/2015   54 y.o.female presents for 1 month follow up on stress/fatigue and UTI. 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 Apparently she had to file a number of  tax extensions and clients are "unhappy".  She's having ongoing issues with Insomia & anxiety. In addition, she's developed recent sx's of dysuria, frequency & urgency.  ***? Stop topamax *** daily headache from 2017? ***  BMI is There is no height or weight on file to calculate BMI., she {HAS HAS DZH:29924} been working on diet and exercise. Wt Readings from Last 3 Encounters:  10/18/19 151 lb (68.5 kg)  06/18/19 141 lb (64 kg)  12/24/18 149 lb 3.2 oz (67.7 kg)   Her blood pressure {HAS HAS NOT:18834} been controlled at home, today their BP is    She {DOES_DOES QAS:34196} workout. She denies chest pain, shortness of breath, dizziness.  Patient is on Vitamin D supplement.   Lab Results  Component Value Date   VD25OH 44 12/24/2018     Lab Results  Component Value Date   QIWLNLGX21 194 12/24/2018      Past Medical History:  Diagnosis Date  . Allergy    SEASONAL  . Anxiety   . Back injury   .  Barrett's esophagus   . Chest pain, unspecified    normal myoview 2000;  echo 5/10:  mild LVH, EF 60-65%  . DDD (degenerative disc disease)   . Depression   . GERD (gastroesophageal reflux disease)   . Hiatal hernia   . HSV-1 (herpes simplex virus 1) infection   . Hyperlipidemia   . IBS (irritable bowel syndrome)   . Insomnia   . Lumbago   . Melanosis coli   . Palpitations    h/o symptomatic PVCs  . Palpitations   . Persistent disorder of initiating or maintaining sleep   . Restless legs syndrome (RLS)      Allergies  Allergen Reactions  . Atenolol Other (See Comments)    Made patient feel like a "zombie"  . Ambien [Zolpidem Tartrate]     Memory issues  . Cholestatin     Hot flashes  . Mirapex [Pramipexole Dihydrochloride]     Hot flashes  . Requip [Ropinirole Hcl]     Hot flashes    Current Outpatient Medications on File Prior to Visit  Medication Sig  . buPROPion (WELLBUTRIN XL) 300 MG 24 hr tablet TAKE 1 TABLET EVERY MORNING FOR MOOD, FOCUS AND CONCENTRATION  . cefUROXime (CEFTIN) 250 MG tablet Take 1 tablet (250 mg total) by mouth 2 (two) times daily with a meal. Take 1 tablet 2 x /day with Food for  7 days , then take 1 tablet 3 x /week  . ciprofloxacin (CIPRO) 500 MG tablet Take 1 tablet  2 x /day with Food for Infection  . citalopram (CELEXA) 20 MG tablet Take 1 tablet Daily for Mood  . fluticasone (FLONASE) 50 MCG/ACT nasal spray Place 2 sprays into both nostrils as needed. Reported on 06/27/2015  . gabapentin (NEURONTIN) 300 MG capsule Take 1  capsules  2 to 3 x /day as needed for Chronic Back Pain  . linaclotide (LINZESS) 145 MCG CAPS capsule Take 1 capsule Daily for Chronic Constipation  . nitrofurantoin, macrocrystal-monohydrate, (MACROBID) 100 MG capsule Take 1 capsule 2 x  /day with Food for UTI  . pantoprazole (PROTONIX) 40 MG tablet Take 1 tablet 2 x /day to Prevent Indigestion & Heartburn  . phentermine (ADIPEX-P) 37.5 MG tablet Take 1 tablet every  morning for Dieting & Weight  Loss  . rosuvastatin (CRESTOR) 20 MG tablet Take 1 tablet Daily for Cholesterol  . topiramate (TOPAMAX) 100 MG tablet Take 1 tablet 2 x /day at Suppertime & Bedtime for Dieting & Weight Loss  . valACYclovir (VALTREX) 500 MG tablet Take 1 tablet Daily prophylaxis for Cold Sores / Fever Blisters   No current facility-administered medications on file prior to visit.    ROS: all negative except above.   Physical Exam:  LMP 12/26/2015   General Appearance: Well nourished, in no apparent distress. Eyes: PERRLA, EOMs, conjunctiva no swelling or erythema Sinuses: No Frontal/maxillary tenderness ENT/Mouth: Ext aud canals clear, TMs without erythema, bulging. No erythema, swelling, or exudate on post pharynx.  Tonsils not swollen or erythematous. Hearing normal.  Neck: Supple, thyroid normal.  Respiratory: Respiratory effort normal, BS equal bilaterally without rales, rhonchi, wheezing or stridor.  Cardio: RRR with no MRGs. Brisk peripheral pulses without edema.  Abdomen: Soft, + BS.  Non tender, no guarding, rebound, hernias, masses. Lymphatics: Non tender without lymphadenopathy.  Musculoskeletal: Full ROM, 5/5 strength, normal gait.  Skin: Warm, dry without rashes, lesions, ecchymosis.  Neuro: Cranial nerves intact. Normal muscle tone, no cerebellar symptoms. Sensation intact.  Psych: Awake and oriented X 3, normal affect, Insight and Judgment appropriate.     Izora Ribas, NP 3:39 PM [facility]

## 2019-12-12 NOTE — Progress Notes (Signed)
History of Present Illness:       This very nice 54 y.o. single WF  presents for 6 month follow up with labile  HTN, HLD, Pre-Diabetes and Vitamin D Deficiency. Patient also has hx/o GERD & IBS-C. In addition, she has hx/o chronic LBP and Chronic Insomnia.       This very nice 54 yo single WF was recently treated 5/24 with Cipro for an Ecoli UTI. U/C was sensitive to Cipro and on 5/26 was switched to [place]. But then with recurrence of sx's, patient was  treated  (11/04/2019) with Ceftin and dysuria has not returned.       Patient has been followed for labile  HTN since 2004 & BP has been controlled. Today's BP is at goal - 122/80.  Patient has CKD3a  (GFR 51) followed by Dr Raymondo Band.  Patient has had no complaints of any cardiac type chest pain, palpitations, dyspnea / orthopnea / PND, dizziness, claudication, or dependent edema.      Hyperlipidemia is controlled with diet & meds. Patient denies myalgias or other med SE's. Last Lipids were at goal:  Lab Results  Component Value Date   CHOL 205 (H) 06/18/2019   HDL 88 06/18/2019   LDLCALC 101 (H) 06/18/2019   TRIG 73 06/18/2019   CHOLHDL 2.3 06/18/2019    Also, the patient has been monitored expectantly for glucose intolerance and has had no symptoms of reactive hypoglycemia, diabetic polys, paresthesias or visual blurring.  Last A1c was Normal & at goal:  Lab Results  Component Value Date   HGBA1C 5.5 12/24/2018           Further, the patient also has history of Vitamin D Deficiency ("36"/ 2012) and supplements vitamin D without any suspected side-effects. Last vitamin D was still low (goal 70-100):  Lab Results  Component Value Date   VD25OH 44 12/24/2018    Current Outpatient Medications on File Prior to Visit  Medication Sig  . buPROPion (WELLBUTRIN XL) 300 MG 24 hr tablet TAKE 1 TABLET EVERY MORNING FOR MOOD, FOCUS AND CONCENTRATION  . fluticasone (FLONASE) 50 MCG/ACT nasal spray Place 2 sprays into  both nostrils as needed. Reported on 06/27/2015  . gabapentin (NEURONTIN) 300 MG capsule Take 1  capsules  2 to 3 x /day as needed for Chronic Back Pain  . linaclotide (LINZESS) 145 MCG CAPS capsule Take 1 capsule Daily for Chronic Constipation  . pantoprazole (PROTONIX) 40 MG tablet Take 1 tablet 2 x /day to Prevent Indigestion & Heartburn  . phentermine (ADIPEX-P) 37.5 MG tablet Take 1 tablet every morning for Dieting & Weight  Loss  . rosuvastatin (CRESTOR) 20 MG tablet Take 1 tablet Daily for Cholesterol  . topiramate (TOPAMAX) 100 MG tablet Take 1 tablet 2 x /day at Suppertime & Bedtime for Dieting & Weight Loss  . valACYclovir (VALTREX) 500 MG tablet Take 1 tablet Daily prophylaxis for Cold Sores / Fever Blisters   No current facility-administered medications on file prior to visit.    Allergies  Allergen Reactions  . Atenolol Other (See Comments)    Made patient feel like a "zombie"  . Ambien [Zolpidem Tartrate]     Memory issues  . Cholestatin     Hot flashes  . Mirapex [Pramipexole Dihydrochloride]     Hot flashes  . Requip [Ropinirole Hcl]     Hot flashes    PMHx:   Past Medical History:  Diagnosis Date  . Allergy  SEASONAL  . Anxiety   . Back injury   . Barrett's esophagus   . Chest pain, unspecified    normal myoview 2000;  echo 5/10:  mild LVH, EF 60-65%  . DDD (degenerative disc disease)   . Depression   . GERD (gastroesophageal reflux disease)   . Hiatal hernia   . HSV-1 (herpes simplex virus 1) infection   . Hyperlipidemia   . IBS (irritable bowel syndrome)   . Insomnia   . Lumbago   . Melanosis coli   . Palpitations    h/o symptomatic PVCs  . Palpitations   . Persistent disorder of initiating or maintaining sleep   . Restless legs syndrome (RLS)     Immunization History  Administered Date(s) Administered  . Influenza Inj Mdck Quad With Preservative 04/15/2019  . Influenza Split 04/15/2013, 04/27/2014  . Influenza, Seasonal, Injecte,  Preservative Fre 04/23/2016  . Influenza-Unspecified 03/27/2012  . PFIZER SARS-COV-2 Vaccination 08/13/2019, 09/03/2019  . PPD Test 04/15/2013, 04/27/2014, 06/30/2015, 09/18/2016, 11/14/2017, 12/24/2018  . Pneumococcal-Unspecified 05/27/2009  . Td 05/27/2001  . Tdap 04/08/2012    Past Surgical History:  Procedure Laterality Date  . COLONOSCOPY  2013  . ESOPHAGOGASTRODUODENOSCOPY N/A 03/04/2014   Procedure: ESOPHAGOGASTRODUODENOSCOPY (EGD);  Surgeon: Ladene Artist, MD;  Location: [facility];  Service: Endoscopy;  Laterality: N/A;  . EYE SURGERY  2001  . REFRACTIVE SURGERY     bilateral eyes   . TUBES IN EARS     as an adult  . UPPER GASTROINTESTINAL ENDOSCOPY      FHx:    Reviewed / unchanged  SHx:    Reviewed / unchanged   Systems Review:  Constitutional: Denies fever, chills, wt changes, headaches, insomnia, fatigue, night sweats, change in appetite. Eyes: Denies redness, blurred vision, diplopia, discharge, itchy, watery eyes.  ENT: Denies discharge, congestion, post nasal drip, epistaxis, sore throat, earache, hearing loss, dental pain, tinnitus, vertigo, sinus pain, snoring.  CV: Denies chest pain, palpitations, irregular heartbeat, syncope, dyspnea, diaphoresis, orthopnea, PND, claudication or edema. Respiratory: denies cough, dyspnea, DOE, pleurisy, hoarseness, laryngitis, wheezing.  Gastrointestinal: Denies dysphagia, odynophagia, heartburn, reflux, water brash, abdominal pain or cramps, nausea, vomiting, bloating, diarrhea, constipation, hematemesis, melena, hematochezia  or hemorrhoids. Genitourinary: Denies nocturia, hesitancy, discharge, hematuria or flank pain. Has had recent  dysuria, frequency & urgency. Musculoskeletal: Denies arthralgias, myalgias, stiffness, jt. swelling, pain, limping or strain/sprain.  Skin: Denies pruritus, rash, hives, warts, acne, eczema or change in skin lesion(s). Neuro: No weakness, tremor, incoordination, spasms, paresthesia or  pain. Psychiatric: Denies confusion, memory loss or sensory loss. Endo: Denies change in weight, skin or hair change.  Heme/Lymph: No excessive bleeding, bruising or enlarged lymph nodes.  Physical Exam  BP 122/80   Pulse 76   Temp (!) 97.2 F (36.2 C)   Resp 16   Ht 5\' 9"  (1.753 m)   Wt 155 lb 6.4 oz (70.5 kg)   LMP 12/26/2015   BMI 22.95 kg/m   Appears  well nourished, well groomed  and in no distress.  Eyes: PERRLA, EOMs, conjunctiva no swelling or erythema. Sinuses: No frontal/maxillary tenderness ENT/Mouth: EAC's clear, TM's nl w/o erythema, bulging. Nares clear w/o erythema, swelling, exudates. Oropharynx clear without erythema or exudates. Oral hygiene is good. Tongue normal, non obstructing. Hearing intact.  Neck: Supple. Thyroid not palpable. Car 2+/2+ without bruits, nodes or JVD. Chest: Respirations nl with BS clear & equal w/o rales, rhonchi, wheezing or stridor.  Cor: Heart sounds normal w/ regular rate and rhythm without  sig. murmurs, gallops, clicks or rubs. Peripheral pulses normal and equal  without edema.  Abdomen: Soft & bowel sounds normal. Non-tender w/o guarding, rebound, hernias, masses or organomegaly.  Lymphatics: Unremarkable.  Musculoskeletal: Full ROM all peripheral extremities, joint stability, 5/5 strength and normal gait.  Skin: Warm, dry without exposed rashes, lesions or ecchymosis apparent.  Neuro: Cranial nerves intact, reflexes equal bilaterally. Sensory-motor testing grossly intact. Tendon reflexes grossly intact.  Pysch: Alert & oriented x 3.  Insight and judgement nl & appropriate. No ideations.  Assessment and Plan:  1. Labile hypertension  - Continue medication, monitor blood pressure at home.  - Continue DASH diet.  Reminder to go to the ER if any CP,  SOB, nausea, dizziness, severe HA, changes vision/speech.  - CBC with Differential/Platelet - COMPLETE METABOLIC PANEL WITH GFR - Magnesium - TSH  2. Hyperlipidemia, mixed  -  Continue diet/meds, exercise,& lifestyle modifications.  - Continue monitor periodic cholesterol/liver & renal functions   - Lipid panel - TSH  3. Abnormal glucose  - Continue diet, exercise  - Lifestyle modifications.  - Monitor appropriate labs.   - Hemoglobin A1c - Insulin, random  4. Vitamin D deficiency  - Continue supplementation.  - VITAMIN D 25 Hydroxy  5. Acute cystitis without hematuria  - Urine Culture - Urinalysis, Routine w reflex microscopic  6. Fatigue, unspecified type  - TSH - Vitamin B12  7. Medication management  - CBC with Differential/Platelet - COMPLETE METABOLIC PANEL WITH GFR - Magnesium - Lipid panel - TSH - Hemoglobin A1c - Insulin, random - VITAMIN D 25 Hydroxy        Discussed  regular exercise, BP monitoring, weight control to achieve/maintain BMI less than 25 and discussed med and SE's. Recommended labs to assess and monitor clinical status with further disposition pending results of labs.  I discussed the assessment and treatment plan with the patient. The patient was provided an opportunity to ask questions and all were answered. The patient agreed with the plan and demonstrated an understanding of the instructions.  I provided over 30 minutes of exam, counseling, chart review and  complex critical decision making.         The patient was advised to call back or seek an in-person evaluation if the symptoms worsen or if the condition fails to improve as anticipated.   Kirtland Bouchard, MD

## 2019-12-13 ENCOUNTER — Ambulatory Visit: Payer: BC Managed Care – PPO | Admitting: Internal Medicine

## 2019-12-13 ENCOUNTER — Encounter: Payer: Self-pay | Admitting: Internal Medicine

## 2019-12-13 ENCOUNTER — Other Ambulatory Visit: Payer: Self-pay

## 2019-12-13 VITALS — BP 122/80 | HR 76 | Temp 97.2°F | Resp 16 | Ht 69.0 in | Wt 155.4 lb

## 2019-12-13 DIAGNOSIS — R7309 Other abnormal glucose: Secondary | ICD-10-CM | POA: Diagnosis not present

## 2019-12-13 DIAGNOSIS — R5383 Other fatigue: Secondary | ICD-10-CM

## 2019-12-13 DIAGNOSIS — E782 Mixed hyperlipidemia: Secondary | ICD-10-CM

## 2019-12-13 DIAGNOSIS — Z79899 Other long term (current) drug therapy: Secondary | ICD-10-CM

## 2019-12-13 DIAGNOSIS — R0989 Other specified symptoms and signs involving the circulatory and respiratory systems: Secondary | ICD-10-CM | POA: Diagnosis not present

## 2019-12-13 DIAGNOSIS — E559 Vitamin D deficiency, unspecified: Secondary | ICD-10-CM

## 2019-12-13 DIAGNOSIS — N3000 Acute cystitis without hematuria: Secondary | ICD-10-CM

## 2019-12-13 NOTE — Patient Instructions (Signed)
Due to recent changes in healthcare laws, you may see the results of your imaging and laboratory studies on MyChart before your provider has had a chance to review them.  We understand that in some cases there may be results that are confusing or concerning to you. Not all laboratory results come back in the same time frame and the provider may be waiting for multiple results in order to interpret others.  Please give [place] 48 hours in order for your provider to thoroughly review all the results before contacting the office for clarification of your results.   ++++++++++++++++++++++++++++++++++  Urinary Tract Infection, Adult  A urinary tract infection (UTI) is an infection of any part of the urinary tract. The urinary tract includes the kidneys, ureters, bladder, and urethra. These organs make, store, and get rid of urine in the body. Your health care provider may use other names to describe the infection. An upper UTI affects the ureters and kidneys (pyelonephritis). A lower UTI affects the bladder (cystitis) and urethra (urethritis). What are the causes? Most urinary tract infections are caused by bacteria in your genital area, around the entrance to your urinary tract (urethra). These bacteria grow and cause inflammation of your urinary tract. What increases the risk? You are more likely to develop this condition if:  You have a urinary catheter that stays in place (indwelling).  You are not able to control when you urinate or have a bowel movement (you have incontinence).  You are female and you: ? Use a spermicide or diaphragm for birth control. ? Have low estrogen levels. ? Are pregnant.  You have certain genes that increase your risk (genetics).  You are sexually active.  You take antibiotic medicines.  You have a condition that causes your flow of urine to slow down, such as: ? An enlarged prostate, if you are female. ? Blockage in your urethra (stricture). ? A kidney stone. ? A  nerve condition that affects your bladder control (neurogenic bladder). ? Not getting enough to drink, or not urinating often.  You have certain medical conditions, such as: ? Diabetes. ? A weak disease-fighting system (immunesystem). ? Sickle cell disease. ? Gout. ? Spinal cord injury. What are the signs or symptoms? Symptoms of this condition include:  Needing to urinate right away (urgently).  Frequent urination or passing small amounts of urine frequently.  Pain or burning with urination.  Blood in the urine.  Urine that smells bad or unusual.  Trouble urinating.  Cloudy urine.  Vaginal discharge, if you are female.  Pain in the abdomen or the lower back. You may also have:  Vomiting or a decreased appetite.  Confusion.  Irritability or tiredness.  A fever.  Diarrhea. The first symptom in older adults may be confusion. In some cases, they may not have any symptoms until the infection has worsened. How is this diagnosed? This condition is diagnosed based on your medical history and a physical exam. You may also have other tests, including:  Urine tests.  Blood tests.  Tests for sexually transmitted infections (STIs). If you have had more than one UTI, a cystoscopy or imaging studies may be done to determine the cause of the infections. How is this treated? Treatment for this condition includes:  Antibiotic medicine.  Over-the-counter medicines to treat discomfort.  Drinking enough water to stay hydrated. If you have frequent infections or have other conditions such as a kidney stone, you may need to see a health care provider who specializes in  the urinary tract (urologist). In rare cases, urinary tract infections can cause sepsis. Sepsis is a life-threatening condition that occurs when the body responds to an infection. Sepsis is treated in the hospital with IV antibiotics, fluids, and other medicines. Follow these instructions at  home:  Medicines  Take over-the-counter and prescription medicines only as told by your health care provider.  If you were prescribed an antibiotic medicine, take it as told by your health care provider. Do not stop using the antibiotic even if you start to feel better. General instructions  Make sure you: ? Empty your bladder often and completely. Do not hold urine for long periods of time. ? Empty your bladder after sex. ? Wipe from front to back after a bowel movement if you are female. Use each tissue one time when you wipe.  Drink enough fluid to keep your urine pale yellow.  Keep all follow-up visits as told by your health care provider. This is important. Contact a health care provider if:  Your symptoms do not get better after 1-2 days.  Your symptoms go away and then return. Get help right away if you have:  Severe pain in your back or your lower abdomen.  A fever.  Nausea or vomiting. Summary  A urinary tract infection (UTI) is an infection of any part of the urinary tract, which includes the kidneys, ureters, bladder, and urethra.  Most urinary tract infections are caused by bacteria in your genital area, around the entrance to your urinary tract (urethra).  Treatment for this condition often includes antibiotic medicines.  If you were prescribed an antibiotic medicine, take it as told by your health care provider. Do not stop using the antibiotic even if you start to feel better.  Keep all follow-up visits as told by your health care provider. This is important. This information is not intended to replace advice given to you by your health care provider. Make sure you discuss any questions you have with your health care provider. Document Revised: 04/30/2018 Document Reviewed: 11/20/2017 Elsevier Patient Education  [place].    ++++++++++++++++++++++++++++++++++  Vit D  & Vit C 1,000 mg   are recommended to help protect  against the Covid-19 and  other Corona viruses.    Also it's recommended  to take  Zinc 50 mg  to help  protect against the Covid-19   and best place to get  is also on [employer][email or web address]  and don't pay more than 6-8 cents /pill !   ===================================== Coronavirus (COVID-19) Are you at risk?  Are you at risk for the Coronavirus (COVID-19)?  To be considered HIGH RISK for Coronavirus (COVID-19), you have to meet the following criteria:  . Traveled to [place], [place], [place], [place] or [place]; or in the [place] to [place], [place], [place]  . or [place]; and have fever, cough, and shortness of breath within the last 2 weeks of travel OR . Been in close contact with a person diagnosed with COVID-19 within the last 2 weeks and have  . fever, cough,and shortness of breath .  . IF YOU DO NOT MEET THESE CRITERIA, YOU ARE CONSIDERED LOW RISK FOR COVID-19.  What to do if you are HIGH RISK for COVID-19?  Marland Kitchen If you are having a medical emergency, call 911. . Seek medical care right away. Before you go to a doctor's office, urgent care or emergency department, .  call ahead and tell them about your recent travel, contact  with someone diagnosed with COVID-19  .  and your symptoms.  . You should receive instructions from your physician's office regarding next steps of care.  . When you arrive at healthcare provider, tell the healthcare staff immediately you have returned from  . visiting [place], [place], [place], [place] or [place]; or traveled in the [place] to [place], [place],  . [place] or [place] in the last two weeks or you have been in close contact with a person diagnosed with  . COVID-19 in the last 2 weeks.   . Tell the health care staff about your symptoms: fever, cough and shortness of breath. . After you have been seen by a medical provider, you will be either: o Tested for (COVID-19) and discharged home on quarantine except to seek medical care if  o symptoms  worsen, and asked to  - Stay home and avoid contact with others until you get your results (4-5 days)  - Avoid travel on public transportation if possible (such as bus, train, or airplane) or o Sent to the Emergency Department by EMS for evaluation, COVID-19 testing  and  o possible admission depending on your condition and test results.  What to do if you are LOW RISK for COVID-19?  Reduce your risk of any infection by using the same precautions used for avoiding the common cold or flu:  Marland Kitchen Wash your hands often with soap and warm water for at least 20 seconds.  If soap and water are not readily available,  . use an alcohol-based hand sanitizer with at least 60% alcohol.  . If coughing or sneezing, cover your mouth and nose by coughing or sneezing into the elbow areas of your shirt or coat, .  into a tissue or into your sleeve (not your hands). . Avoid shaking hands with others and consider head nods or verbal greetings only. . Avoid touching your eyes, nose, or mouth with unwashed hands.  . Avoid close contact with people who are sick. . Avoid places or events with large numbers of people in one location, like concerts or sporting events. . Carefully consider travel plans you have or are making. . If you are planning any travel outside or inside the [place], visit the CDC's Travelers' Health webpage for the latest health notices. . If you have some symptoms but not all symptoms, continue to monitor at home and seek medical attention  . if your symptoms worsen. . If you are having a medical emergency, call 911.   ++++++++++++++++++++++++++++++++ Recommend Adult Low Dose Aspirin or  coated  Aspirin 81 mg daily  To reduce risk of Colon Cancer 40 %,  Skin Cancer 26 % ,  Melanoma 46%  and  Pancreatic cancer 60% ++++++++++++++++++++++++++++++++ Vitamin D goal  is between 70-100.  Please make sure that you are taking your Vitamin D as directed.  It is very important as a natural  anti-inflammatory  helping hair, skin, and nails, as well as reducing stroke and heart attack risk.  It helps your bones and helps with mood. It also decreases numerous cancer risks so please take it as directed.  Low Vit D is associated with a 200-300% higher risk for CANCER  and 200-300% higher risk for HEART   ATTACK  &  STROKE.   .....................................Marland Kitchen It is also associated with higher death rate at younger ages,  autoimmune diseases like Rheumatoid arthritis, Lupus, Multiple Sclerosis.    Also many other serious conditions, like depression, Alzheimer's Dementia,  infertility, muscle aches, fatigue, fibromyalgia - just to name a few. ++++++++++++++++++++ Recommend the book "The END of DIETING" by Dr Excell Seltzer  & the book "The END of DIABETES " by Dr Excell Seltzer At [email or web address] - get book & Audio CD's    Being diabetic has a  300% increased risk for heart attack, stroke, cancer, and alzheimer- type vascular dementia. It is very important that you work harder with diet by avoiding all foods that are white. Avoid white rice (brown & wild rice is OK), white potatoes (sweetpotatoes in moderation is OK), White bread or wheat bread or anything made out of white flour like bagels, donuts, rolls, buns, biscuits, cakes, pastries, cookies, pizza crust, and pasta (made from white flour & egg whites) - vegetarian pasta or spinach or wheat pasta is OK. Multigrain breads like Arnold's or Pepper[employer], or multigrain sandwich thins or flatbreads.  Diet, exercise and weight loss can reverse and cure diabetes in the early stages.  Diet, exercise and weight loss is very important in the control and prevention of complications of diabetes which affects every system in your body, ie. Brain - dementia/stroke, eyes - glaucoma/blindness, heart - heart attack/heart failure, kidneys - dialysis, stomach - gastric paralysis, intestines - malabsorption, nerves - severe painful neuritis, circulation -  gangrene & loss of a leg(s), and finally cancer and Alzheimers.    I recommend avoid fried & greasy foods,  sweets/candy, white rice (brown or wild rice or Quinoa is OK), white potatoes (sweet potatoes are OK) - anything made from white flour - bagels, doughnuts, rolls, buns, biscuits,white and wheat breads, pizza crust and traditional pasta made of white flour & egg white(vegetarian pasta or spinach or wheat pasta is OK).  Multi-grain bread is OK - like multi-grain flat bread or sandwich thins. Avoid alcohol in excess. Exercise is also important.    Eat all the vegetables you want - avoid meat, especially red meat and dairy - especially cheese.  Cheese is the most concentrated form of trans-fats which is the worst thing to clog up our arteries. Veggie cheese is OK which can be found in the fresh produce section at [employer] or [employer] or [employer]  +++++++++++++++++++++ DASH Eating Plan  DASH stands for "Dietary Approaches to Stop Hypertension."   The DASH eating plan is a healthy eating plan that has been shown to reduce high blood pressure (hypertension). Additional health benefits may include reducing the risk of type 2 diabetes mellitus, heart disease, and stroke. The DASH eating plan may also help with weight loss. WHAT DO I NEED TO KNOW ABOUT THE DASH EATING PLAN? For the DASH eating plan, you will follow these general guidelines:  Choose foods with a percent daily value for sodium of less than 5% (as listed on the food label).  Use salt-free seasonings or herbs instead of table salt or sea salt.  Check with your health care provider or pharmacist before using salt substitutes.  Eat lower-sodium products, often labeled as "lower sodium" or "no salt added."  Eat fresh foods.  Eat more vegetables, fruits, and low-fat dairy products.  Choose whole grains. Look for the word "whole" as the first word in the ingredient list.  Choose fish   Limit sweets, desserts, sugars, and  sugary drinks.  Choose heart-healthy fats.  Eat veggie cheese   Eat more home-cooked food and less restaurant, buffet, and fast food.  Limit fried foods.  Cook foods using methods other than frying.  Limit canned vegetables.  If you do use them, rinse them well to decrease the sodium.  When eating at a restaurant, ask that your food be prepared with less salt, or no salt if possible.                      WHAT FOODS CAN I EAT? Read Dr Fara Olden Fuhrman's books on The End of Dieting & The End of Diabetes  Grains Whole grain or whole wheat bread. Brown rice. Whole grain or whole wheat pasta. Quinoa, bulgur, and whole grain cereals. Low-sodium cereals. Corn or whole wheat flour tortillas. Whole grain cornbread. Whole grain crackers. Low-sodium crackers.  Vegetables Fresh or frozen vegetables (raw, steamed, roasted, or grilled). Low-sodium or reduced-sodium tomato and vegetable juices. Low-sodium or reduced-sodium tomato sauce and paste. Low-sodium or reduced-sodium canned vegetables.   Fruits All fresh, canned (in natural juice), or frozen fruits.  Protein Products  All fish and seafood.  Dried beans, peas, or lentils. Unsalted nuts and seeds. Unsalted canned beans.  Dairy Low-fat dairy products, such as skim or 1% milk, 2% or reduced-fat cheeses, low-fat ricotta or cottage cheese, or plain low-fat yogurt. Low-sodium or reduced-sodium cheeses.  Fats and Oils Tub margarines without trans fats. Light or reduced-fat mayonnaise and salad dressings (reduced sodium). Avocado. Safflower, olive, or canola oils. Natural peanut or almond butter.  Other Unsalted popcorn and pretzels. The items listed above may not be a complete list of recommended foods or beverages. Contact your dietitian for more options.  +++++++++++++++  WHAT FOODS ARE NOT RECOMMENDED? Grains/ White flour or wheat flour White bread. White pasta. White rice. Refined cornbread. Bagels and croissants. Crackers that contain  trans fat.  Vegetables  Creamed or fried vegetables. Vegetables in a . Regular canned vegetables. Regular canned tomato sauce and paste. Regular tomato and vegetable juices.  Fruits Dried fruits. Canned fruit in light or heavy syrup. Fruit juice.  Meat and Other Protein Products Meat in general - RED meat & White meat.  Fatty cuts of meat. Ribs, chicken wings, all processed meats as bacon, sausage, bologna, salami, fatback, hot dogs, bratwurst and packaged luncheon meats.  Dairy Whole or 2% milk, cream, half-and-half, and cream cheese. Whole-fat or sweetened yogurt. Full-fat cheeses or blue cheese. Non-dairy creamers and whipped toppings. Processed cheese, cheese spreads, or cheese curds.  Condiments Onion and garlic salt, seasoned salt, table salt, and sea salt. Canned and packaged gravies. Worcestershire sauce. Tartar sauce. Barbecue sauce. Teriyaki sauce. Soy sauce, including reduced sodium. Steak sauce. Fish sauce. Oyster sauce. Cocktail sauce. Horseradish. Ketchup and mustard. Meat flavorings and tenderizers. Bouillon cubes. Hot sauce. Tabasco sauce. Marinades. Taco seasonings. Relishes.  Fats and Oils Butter, stick margarine, lard, shortening and bacon fat. Coconut, palm kernel, or palm oils. Regular salad dressings.  Pickles and olives. Salted popcorn and pretzels.  The items listed above may not be a complete list of foods and beverages to avoid.

## 2019-12-14 ENCOUNTER — Other Ambulatory Visit: Payer: Self-pay | Admitting: Internal Medicine

## 2019-12-14 DIAGNOSIS — N1831 Chronic kidney disease, stage 3a: Secondary | ICD-10-CM

## 2019-12-14 LAB — CBC WITH DIFFERENTIAL/PLATELET
Absolute Monocytes: 362 cells/uL (ref 200–950)
Basophils Absolute: 67 cells/uL (ref 0–200)
Basophils Relative: 1 %
Eosinophils Absolute: 107 cells/uL (ref 15–500)
Eosinophils Relative: 1.6 %
HCT: 40.1 % (ref 35.0–45.0)
Hemoglobin: 13.8 g/dL (ref 11.7–15.5)
Lymphs Abs: 2251 cells/uL (ref 850–3900)
MCH: 30.9 pg (ref 27.0–33.0)
MCHC: 34.4 g/dL (ref 32.0–36.0)
MCV: 89.9 fL (ref 80.0–100.0)
MPV: 10.2 fL (ref 7.5–12.5)
Monocytes Relative: 5.4 %
Neutro Abs: 3913 cells/uL (ref 1500–7800)
Neutrophils Relative %: 58.4 %
Platelets: 270 10*3/uL (ref 140–400)
RBC: 4.46 10*6/uL (ref 3.80–5.10)
RDW: 12.1 % (ref 11.0–15.0)
Total Lymphocyte: 33.6 %
WBC: 6.7 10*3/uL (ref 3.8–10.8)

## 2019-12-14 LAB — LIPID PANEL
Cholesterol: 176 mg/dL (ref <200)
HDL: 79 mg/dL (ref >=50)
LDL Cholesterol (Calc): 74 mg/dL (calc)
Non-HDL Cholesterol (Calc): 97 mg/dL (calc) (ref <130)
Total CHOL/HDL Ratio: 2.2 (calc) (ref <5.0)
Triglycerides: 157 mg/dL — ABNORMAL HIGH (ref <150)

## 2019-12-14 LAB — COMPLETE METABOLIC PANEL WITH GFR
AG Ratio: 1.9 (calc) (ref 1.0–2.5)
ALT: 14 U/L (ref 6–29)
AST: 15 U/L (ref 10–35)
Albumin: 4.4 g/dL (ref 3.6–5.1)
Alkaline phosphatase (APISO): 66 U/L (ref 37–153)
BUN/Creatinine Ratio: 14 (calc) (ref 6–22)
BUN: 19 mg/dL (ref 7–25)
CO2: 27 mmol/L (ref 20–32)
Calcium: 9.2 mg/dL (ref 8.6–10.4)
Chloride: 106 mmol/L (ref 98–110)
Creat: 1.4 mg/dL — ABNORMAL HIGH (ref 0.50–1.05)
GFR, Est African American: 49 mL/min/{1.73_m2} — ABNORMAL LOW (ref >=60)
GFR, Est Non African American: 42 mL/min/{1.73_m2} — ABNORMAL LOW (ref >=60)
Globulin: 2.3 g/dL (calc) (ref 1.9–3.7)
Glucose, Bld: 91 mg/dL (ref 65–99)
Potassium: 4.5 mmol/L (ref 3.5–5.3)
Sodium: 137 mmol/L (ref 135–146)
Total Bilirubin: 0.3 mg/dL (ref 0.2–1.2)
Total Protein: 6.7 g/dL (ref 6.1–8.1)

## 2019-12-14 LAB — URINE CULTURE
MICRO NUMBER:: 10722056
SPECIMEN QUALITY:: ADEQUATE

## 2019-12-14 LAB — VITAMIN D 25 HYDROXY (VIT D DEFICIENCY, FRACTURES): Vit D, 25-Hydroxy: 36 ng/mL (ref 30–100)

## 2019-12-14 LAB — INSULIN, RANDOM: Insulin: 7.7 u[IU]/mL

## 2019-12-14 LAB — MAGNESIUM: Magnesium: 1.9 mg/dL (ref 1.5–2.5)

## 2019-12-14 LAB — VITAMIN B12: Vitamin B-12: 405 pg/mL (ref 200–1100)

## 2019-12-14 LAB — URINALYSIS, ROUTINE W REFLEX MICROSCOPIC
Bilirubin Urine: NEGATIVE
Glucose, UA: NEGATIVE
Hgb urine dipstick: NEGATIVE
Ketones, ur: NEGATIVE
Leukocytes,Ua: NEGATIVE
Nitrite: NEGATIVE
Protein, ur: NEGATIVE
Specific Gravity, Urine: 1.006 (ref 1.001–1.03)
pH: 5.5 (ref 5.0–8.0)

## 2019-12-14 LAB — TSH: TSH: 2.05 mIU/L

## 2019-12-14 LAB — HEMOGLOBIN A1C
Hgb A1c MFr Bld: 5.2 % of total Hgb (ref <5.7)
Mean Plasma Glucose: 103 (calc)
eAG (mmol/L): 5.7 (calc)

## 2019-12-14 NOTE — Progress Notes (Signed)
===========================================================  -    Creatinine is slightly elevated and GFR is slightly down   - Last Appt with Kidney specialist - Dr Corliss Parish was 4 years ago in Apr 2017, So..........  -  will request appt to see her again for re-assessment  ===========================================================  -  Total Chol = 176 and LDL 74 - Both  Excellent   - Very low risk for Heart Attack  / Stroke =============================================================  - A1c - Normal - No Diabetes  ===========================================================  -  Vitamin D = 36 =- Low   - Vitamin D goal is between 70-100.   - Please make sure that you are taking your Vitamin D as directed.   - It is very important as a natural anti-inflammatory and helping the  immune system protect against viral infections, like the Covid-19    helping hair, skin, and nails, as well as reducing stroke and heart attack risk.   - It helps your bones and helps with mood.  - It also decreases numerous cancer risks so please take it as directed.   - Low Vit D is associated with a 200-300% higher risk for CANCER   and 200-300% higher risk for HEART   ATTACK  &  STROKE.    - It is also associated with higher death rate at younger ages,   autoimmune diseases like Rheumatoid arthritis, Lupus, Multiple Sclerosis.     - Also many other serious conditions, like depression, Alzheimer's  Dementia, infertility, muscle aches, fatigue, fibromyalgia - just to name a few ===========================================================  -  Urine culture  still pending , But U/A appears Normal ===========================================================  - All Else - CBC -  Electrolytes - Liver - Magnesium & Thyroid    - all  Normal / OK ==================================================== ======

## 2019-12-14 NOTE — Progress Notes (Signed)
==========================================================  -   Urine Culture - Negative  - No Infection    <><><><><><><><><><><><><><><><><><><><><><><><><><><><><><><><><> <><><><><><><><><><><><><><><><><><><><><><><><><><><><><><><><><> 

## 2019-12-20 ENCOUNTER — Other Ambulatory Visit: Payer: Self-pay | Admitting: Internal Medicine

## 2019-12-22 ENCOUNTER — Other Ambulatory Visit: Payer: Self-pay | Admitting: Internal Medicine

## 2019-12-22 MED ORDER — CYCLOBENZAPRINE HCL 10 MG PO TABS: ORAL_TABLET | ORAL | 0 refills | Status: DC

## 2020-01-03 DIAGNOSIS — M545 Low back pain: Secondary | ICD-10-CM | POA: Diagnosis not present

## 2020-01-03 DIAGNOSIS — M7582 Other shoulder lesions, left shoulder: Secondary | ICD-10-CM | POA: Diagnosis not present

## 2020-01-03 DIAGNOSIS — M542 Cervicalgia: Secondary | ICD-10-CM | POA: Diagnosis not present

## 2020-01-10 ENCOUNTER — Other Ambulatory Visit: Payer: Self-pay | Admitting: Internal Medicine

## 2020-01-10 DIAGNOSIS — G4700 Insomnia, unspecified: Secondary | ICD-10-CM

## 2020-01-11 DIAGNOSIS — M542 Cervicalgia: Secondary | ICD-10-CM | POA: Diagnosis not present

## 2020-01-11 DIAGNOSIS — M545 Low back pain: Secondary | ICD-10-CM | POA: Diagnosis not present

## 2020-01-18 DIAGNOSIS — Z20822 Contact with and (suspected) exposure to covid-19: Secondary | ICD-10-CM | POA: Diagnosis not present

## 2020-02-01 ENCOUNTER — Encounter: Payer: BC Managed Care – PPO | Admitting: Internal Medicine

## 2020-02-01 ENCOUNTER — Other Ambulatory Visit: Payer: Self-pay | Admitting: Internal Medicine

## 2020-02-10 DIAGNOSIS — M5136 Other intervertebral disc degeneration, lumbar region: Secondary | ICD-10-CM | POA: Diagnosis not present

## 2020-02-10 DIAGNOSIS — M545 Low back pain: Secondary | ICD-10-CM | POA: Diagnosis not present

## 2020-02-10 DIAGNOSIS — M542 Cervicalgia: Secondary | ICD-10-CM | POA: Diagnosis not present

## 2020-02-10 DIAGNOSIS — M5022 Other cervical disc displacement, mid-cervical region, unspecified level: Secondary | ICD-10-CM | POA: Diagnosis not present

## 2020-02-15 ENCOUNTER — Encounter: Payer: BC Managed Care – PPO | Admitting: Internal Medicine

## 2020-02-18 ENCOUNTER — Other Ambulatory Visit: Payer: Self-pay | Admitting: Internal Medicine

## 2020-02-18 DIAGNOSIS — M5136 Other intervertebral disc degeneration, lumbar region: Secondary | ICD-10-CM | POA: Diagnosis not present

## 2020-02-18 DIAGNOSIS — K2100 Gastro-esophageal reflux disease with esophagitis, without bleeding: Secondary | ICD-10-CM

## 2020-03-03 DIAGNOSIS — I129 Hypertensive chronic kidney disease with stage 1 through stage 4 chronic kidney disease, or unspecified chronic kidney disease: Secondary | ICD-10-CM | POA: Diagnosis not present

## 2020-03-03 DIAGNOSIS — N1832 Chronic kidney disease, stage 3b: Secondary | ICD-10-CM | POA: Diagnosis not present

## 2020-03-04 ENCOUNTER — Other Ambulatory Visit: Payer: Self-pay | Admitting: Internal Medicine

## 2020-03-07 ENCOUNTER — Other Ambulatory Visit: Payer: Self-pay | Admitting: Nephrology

## 2020-03-07 DIAGNOSIS — N1832 Chronic kidney disease, stage 3b: Secondary | ICD-10-CM

## 2020-03-10 DIAGNOSIS — M5136 Other intervertebral disc degeneration, lumbar region: Secondary | ICD-10-CM | POA: Diagnosis not present

## 2020-03-11 ENCOUNTER — Other Ambulatory Visit: Payer: Self-pay | Admitting: Internal Medicine

## 2020-03-14 ENCOUNTER — Ambulatory Visit
Admission: RE | Admit: 2020-03-14 | Discharge: 2020-03-14 | Disposition: A | Discharge status: Home / Self Care | Payer: BLUE CROSS/BLUE SHIELD | Source: Ambulatory Visit | Attending: Nephrology | Admitting: Nephrology

## 2020-03-14 DIAGNOSIS — N1832 Chronic kidney disease, stage 3b: Secondary | ICD-10-CM

## 2020-03-14 DIAGNOSIS — N189 Chronic kidney disease, unspecified: Secondary | ICD-10-CM | POA: Diagnosis not present

## 2020-03-14 NOTE — Progress Notes (Signed)
Annual Screening/Preventative Visit & Comprehensive Evaluation &  Examination     This very nice 54 y.o. single WF presents for a Screening /Preventative Visit & comprehensive evaluation and management of multiple medical co-morbidities.  Patient has been followed for labile  HTN, HLD, Prediabetes  and Vitamin D Deficiency.Other problems include IBS-C and GERD.  She also has Chronic Lumbago and Insomnia.       Labile  HTN predates since 2004. Patient's BP has been controlled at home and patient denies any cardiac symptoms as chest pain, palpitations, shortness of breath, dizziness or ankle swelling. Today's BP is at goal-  110/72.  Patient is followed by Dr Raymondo Band for CKD3b (GFR 42).       Patient's hyperlipidemia is controlled with diet and medications. Patient denies myalgias or other medication SE's. Last lipids were at goal:  Lab Results  Component Value Date   CHOL 176 12/13/2019   HDL 79 12/13/2019   LDLCALC 74 12/13/2019   TRIG 157 (H) 12/13/2019   CHOLHDL 2.2 12/13/2019       Patient has been followe expectantly for glucose intolerance  and patient denies reactive hypoglycemic symptoms, visual blurring, diabetic polys or paresthesias. Last A1c was Normal & at goal:  Lab Results  Component Value Date   HGBA1C 5.2 12/13/2019       Finally, patient has history of Vitamin D Deficiency ("36"/2012) and last Vitamin D was still low:   Lab Results  Component Value Date   VD25OH 36 12/13/2019    Current Outpatient Medications on File Prior to Visit  Medication Sig  . buPROPion (WELLBUTRIN XL) 300 MG 24 hr tablet TAKE 1 TABLET EVERY MORNING FOR MOOD, FOCUS AND CONCENTRATION  . cyclobenzaprine (FLEXERIL) 10 MG tablet TAKE 1/2 TO 1 TABLET 3 X /DAY AS NEEDED FOR MUSCLE SPASM  . fluticasone (FLONASE) 50 MCG/ACT nasal spray Place 2 sprays into both nostrils as needed. Reported on 06/27/2015  . gabapentin (NEURONTIN) 300 MG capsule Take 1  capsules  2 to 3 x /day as  needed for Chronic Back Pain  . linaclotide (LINZESS) 145 MCG CAPS capsule Take 1 capsule Daily for Chronic Constipation  . pantoprazole (PROTONIX) 40 MG tablet TAKE 1 TABLET 2 TIMES DAILY TO PREVENT INDIGESTION AND HEARTBURN  . phentermine (ADIPEX-P) 37.5 MG tablet Take 1 tablet every morning for Dieting & Weight  Loss  . rosuvastatin (CRESTOR) 20 MG tablet Take     1 tablet     Daily     for Cholesterol  . topiramate (TOPAMAX) 50 MG tablet Take 1 tablet     2 x /day     at Suppertime & Bedtime     for Dieting & Weight Loss  . valACYclovir (VALTREX) 500 MG tablet Take 1 tablet Daily prophylaxis for Cold Sores / Fever Blisters   No current facility-administered medications on file prior to visit.   Allergies  Allergen Reactions  . Atenolol Other (See Comments)    Made patient feel like a "zombie"  . Ambien [Zolpidem Tartrate]     Memory issues  . Cholestatin     Hot flashes  . Mirapex [Pramipexole Dihydrochloride]     Hot flashes  . Requip [Ropinirole Hcl]     Hot flashes   Past Medical History:  Diagnosis Date  . Allergy    SEASONAL  . Anxiety   . Back injury   . Barrett's esophagus   . Chest pain, unspecified    normal myoview 2000;  echo 5/10:  mild LVH, EF 60-65%  . DDD (degenerative disc disease)   . Depression   . GERD (gastroesophageal reflux disease)   . Hiatal hernia   . HSV-1 (herpes simplex virus 1) infection   . Hyperlipidemia   . IBS (irritable bowel syndrome)   . Insomnia   . Lumbago   . Melanosis coli   . Palpitations    h/o symptomatic PVCs  . Palpitations   . Persistent disorder of initiating or maintaining sleep   . Restless legs syndrome (RLS)    Health Maintenance  Topic Date Due  . INFLUENZA VACCINE  12/26/2019  . MAMMOGRAM  03/30/2020  . PAP SMEAR-Modifier  06/03/2021  . TETANUS/TDAP  04/08/2022  . COLONOSCOPY  04/15/2024  . COVID-19 Vaccine  Completed  . Hepatitis C Screening  Completed  . HIV Screening  Completed   Immunization  History  Administered Date(s) Administered  . Influenza Inj Mdck Quad With Preservative 04/15/2019  . Influenza Split 04/15/2013, 04/27/2014  . Influenza, Seasonal, Injecte, Preservative Fre 04/23/2016  . Influenza-Unspecified 03/27/2012  . PFIZER SARS-COV-2 Vaccination 08/13/2019, 09/03/2019  . PPD Test 04/15/2013, 04/27/2014, 06/30/2015, 09/18/2016, 11/14/2017, 12/24/2018  . Pneumococcal-Unspecified 05/27/2009  . Td 05/27/2001  . Tdap 04/08/2012    Last Colon - 04/20/2014 - Dr Fuller Plan - recc 10 yr f/u due Nov 2025  Last EGD - 12/10/2017 - Dr Fuller Plan (+) Barrett's - recc 3 yr f/u in July 2022   Last MGM - 03/31/2019  Past Surgical History:  Procedure Laterality Date  . COLONOSCOPY  2013  . ESOPHAGOGASTRODUODENOSCOPY N/A 03/04/2014   Procedure: ESOPHAGOGASTRODUODENOSCOPY (EGD);  Surgeon: Ladene Artist, MD;  Location: [facility];  Service: Endoscopy;  Laterality: N/A;  . EYE SURGERY  2001  . REFRACTIVE SURGERY     bilateral eyes   . TUBES IN EARS     as an adult  . UPPER GASTROINTESTINAL ENDOSCOPY     Family History  Problem Relation Age of Onset  . Heart disease Cousin   . Lung cancer Cousin   . Hypertension Mother   . Osteoporosis Mother   . Breast cancer Sister 53       BRCA NEG/BILAT MASTECTOMY/CHEMO X 1 YR  . Hyperlipidemia Sister   . Ovarian cancer Sister   . Colon cancer Maternal Grandmother   . Stroke Maternal Grandmother   . Osteoporosis Maternal Grandmother   . Colon cancer Maternal Grandfather   . Multiple myeloma Paternal Grandmother   . Hypertension Father   . Hyperlipidemia Father   . Esophageal cancer Neg Hx   . Liver cancer Neg Hx   . Pancreatic cancer Neg Hx   . Rectal cancer Neg Hx   . Stomach cancer Neg Hx    Social History   Tobacco Use  . Smoking status: Never Smoker  . Smokeless tobacco: Never Used  Vaping Use  . Vaping Use: Never used  Substance Use Topics  . Alcohol use: Yes    Alcohol/week: 1.0 standard drink    Types: 1 Standard  drinks or equivalent per week    Comment: Social  . Drug use: No    ROS Constitutional: Denies fever, chills, weight loss/gain, headaches, insomnia,  night sweats, and change in appetite. Does c/o fatigue. Eyes: Denies redness, blurred vision, diplopia, discharge, itchy, watery eyes.  ENT: Denies discharge, congestion, post nasal drip, epistaxis, sore throat, earache, hearing loss, dental pain, Tinnitus, Vertigo, Sinus pain, snoring.  Cardio: Denies chest pain, palpitations, irregular heartbeat, syncope, dyspnea, diaphoresis, orthopnea,  PND, claudication, edema Respiratory: denies cough, dyspnea, DOE, pleurisy, hoarseness, laryngitis, wheezing.  Gastrointestinal: Denies dysphagia, heartburn, reflux, water brash, pain, cramps, nausea, vomiting, bloating, diarrhea, constipation, hematemesis, melena, hematochezia, jaundice, hemorrhoids Genitourinary: Denies dysuria, frequency, urgency, nocturia, hesitancy, discharge, hematuria, flank pain Breast: Breast lumps, nipple discharge, bleeding.  Musculoskeletal: Denies arthralgia, myalgia, stiffness, Jt. Swelling, pain, limp, and strain/sprain. Denies falls. Skin: Denies puritis, rash, hives, warts, acne, eczema, changing in skin lesion Neuro: No weakness, tremor, incoordination, spasms, paresthesia, pain Psychiatric: Denies confusion, memory loss, sensory loss. Denies Depression. Endocrine: Denies change in weight, skin, hair change, nocturia, and paresthesia, diabetic polys, visual blurring, hyper / hypo glycemic episodes.  Heme/Lymph: No excessive bleeding, bruising, enlarged lymph nodes.  Physical Exam  BP 110/72   Pulse 92   Temp (!) 97.2 F (36.2 C)   Resp 16   Ht 5' 8.75" (1.746 m)   Wt 151 lb 9.6 oz (68.8 kg)   LMP 12/26/2015   SpO2 99%   BMI 22.55 kg/m   General Appearance: Well nourished, well groomed and in no apparent distress.  Eyes: PERRLA, EOMs, conjunctiva no swelling or erythema, normal fundi and vessels. Sinuses: No  frontal/maxillary tenderness ENT/Mouth: EACs patent / TMs  nl. Nares clear without erythema, swelling, mucoid exudates. Oral hygiene is good. No erythema, swelling, or exudate. Tongue normal, non-obstructing. Tonsils not swollen or erythematous. Hearing normal.  Neck: Supple, thyroid not palpable. No bruits, nodes or JVD. Respiratory: Respiratory effort normal.  BS equal and clear bilateral without rales, rhonci, wheezing or stridor. Cardio: Heart sounds are normal with regular rate and rhythm and no murmurs, rubs or gallops. Peripheral pulses are normal and equal bilaterally without edema. No aortic or femoral bruits. Chest: symmetric with normal excursions and percussion. Breasts: Symmetric, without lumps, nipple discharge, retractions, or fibrocystic changes.  Abdomen: Flat, soft with bowel sounds active. Nontender, no guarding, rebound, hernias, masses, or organomegaly.  Lymphatics: Non tender without lymphadenopathy.  Genitourinary:  Musculoskeletal: Full ROM all peripheral extremities, joint stability, 5/5 strength, and normal gait. Skin: Warm and dry without rashes, lesions, cyanosis, clubbing or  ecchymosis.  Neuro: Cranial nerves intact, reflexes equal bilaterally. Normal muscle tone, no cerebellar symptoms. Sensation intact.  Pysch: Alert and oriented X 3, normal affect, Insight and Judgment appropriate.   Assessment and Plan  1. Annual Preventative Screening Examination   2. Labile hypertension  - EKG 12-Lead - [place], RETROPERITNL ABD,  LTD - Urinalysis, Routine w reflex microscopic - Microalbumin / creatinine urine ratio - CBC with Differential/Platelet - COMPLETE METABOLIC PANEL WITH GFR - Magnesium - TSH  3. Hyperlipidemia, mixed  - EKG 12-Lead - [place], RETROPERITNL ABD,  LTD - Lipid panel - TSH  4. Abnormal glucose  - EKG 12-Lead - [place], RETROPERITNL ABD,  LTD - Hemoglobin A1c - Insulin, random  5. Vitamin D deficiency  - VITAMIN D 25 Hydroxy  6. Stage 3b  chronic kidney disease (HCC)  - Urinalysis, Routine w reflex microscopic - Microalbumin / creatinine urine ratio - PTH, intact and calcium  7. Gastroesophageal reflux disease  - CBC with Differential/Platelet  8. Screening examination for pulmonary tuberculosis  - TB Skin Test  9. Screening for colorectal cancer  - POC Hemoccult Bld/Stl   10. Screening for ischemic heart disease  - EKG 12-Lead - Lipid panel  11. FHx: heart disease  - EKG 12-Lead - [place], RETROPERITNL ABD,  LTD  12. Screening for AAA (aortic abdominal aneurysm)  - [place], RETROPERITNL ABD,  LTD  13. Fatigue  -  Iron,Total/Total Iron Binding Cap - Vitamin B12 - CBC with Differential/Platelet - TSH  14. Medication management  - Urinalysis, Routine w reflex microscopic - Microalbumin / creatinine urine ratio - CBC with Differential/Platelet - COMPLETE METABOLIC PANEL WITH GFR - Magnesium - Lipid panel - TSH - Hemoglobin A1c - Insulin, random - VITAMIN D 25 Hydroxy          Patient was counseled in prudent diet to achieve/maintain BMI less than 25 for weight control, BP monitoring, regular exercise and medications. Discussed med's effects and SE's. Screening labs and tests as requested with regular follow-up as recommended. Over 40 minutes of exam, counseling, chart review and high complex critical decision making was performed.   Kirtland Bouchard, MD

## 2020-03-15 ENCOUNTER — Encounter: Payer: Self-pay | Admitting: Internal Medicine

## 2020-03-15 ENCOUNTER — Other Ambulatory Visit: Payer: Self-pay

## 2020-03-15 ENCOUNTER — Ambulatory Visit: Payer: BC Managed Care – PPO | Admitting: Internal Medicine

## 2020-03-15 VITALS — BP 110/72 | HR 92 | Temp 97.2°F | Resp 16 | Ht 68.75 in | Wt 151.6 lb

## 2020-03-15 DIAGNOSIS — Z8249 Family history of ischemic heart disease and other diseases of the circulatory system: Secondary | ICD-10-CM

## 2020-03-15 DIAGNOSIS — R5383 Other fatigue: Secondary | ICD-10-CM

## 2020-03-15 DIAGNOSIS — Z1211 Encounter for screening for malignant neoplasm of colon: Secondary | ICD-10-CM

## 2020-03-15 DIAGNOSIS — Z0000 Encounter for general adult medical examination without abnormal findings: Secondary | ICD-10-CM | POA: Diagnosis not present

## 2020-03-15 DIAGNOSIS — Z111 Encounter for screening for respiratory tuberculosis: Secondary | ICD-10-CM

## 2020-03-15 DIAGNOSIS — Z136 Encounter for screening for cardiovascular disorders: Secondary | ICD-10-CM | POA: Diagnosis not present

## 2020-03-15 DIAGNOSIS — Z13220 Encounter for screening for lipoid disorders: Secondary | ICD-10-CM

## 2020-03-15 DIAGNOSIS — Z0001 Encounter for general adult medical examination with abnormal findings: Secondary | ICD-10-CM

## 2020-03-15 DIAGNOSIS — Z130 Encounter for screening for diseases of the blood and blood-forming organs and certain disorders involving the immune mechanism: Secondary | ICD-10-CM | POA: Diagnosis not present

## 2020-03-15 DIAGNOSIS — N1832 Chronic kidney disease, stage 3b: Secondary | ICD-10-CM

## 2020-03-15 DIAGNOSIS — Z131 Encounter for screening for diabetes mellitus: Secondary | ICD-10-CM | POA: Diagnosis not present

## 2020-03-15 DIAGNOSIS — G4700 Insomnia, unspecified: Secondary | ICD-10-CM

## 2020-03-15 DIAGNOSIS — R7309 Other abnormal glucose: Secondary | ICD-10-CM

## 2020-03-15 DIAGNOSIS — E782 Mixed hyperlipidemia: Secondary | ICD-10-CM

## 2020-03-15 DIAGNOSIS — K2100 Gastro-esophageal reflux disease with esophagitis, without bleeding: Secondary | ICD-10-CM

## 2020-03-15 DIAGNOSIS — Z79899 Other long term (current) drug therapy: Secondary | ICD-10-CM

## 2020-03-15 DIAGNOSIS — R0989 Other specified symptoms and signs involving the circulatory and respiratory systems: Secondary | ICD-10-CM | POA: Diagnosis not present

## 2020-03-15 DIAGNOSIS — E559 Vitamin D deficiency, unspecified: Secondary | ICD-10-CM

## 2020-03-15 MED ORDER — TRAZODONE HCL 150 MG PO TABS: ORAL_TABLET | ORAL | 0 refills | Status: DC

## 2020-03-15 NOTE — Patient Instructions (Signed)
Due to recent changes in healthcare laws, you may see the results of your imaging and laboratory studies on MyChart before your provider has had a chance to review them.  We understand that in some cases there may be results that are confusing or concerning to you. Not all laboratory results come back in the same time frame and the provider may be waiting for multiple results in order to interpret others.  Please give [place] 48 hours in order for your provider to thoroughly review all the results before contacting the office for clarification of your results.   +++++++++++++++++++++++++  Vit D  & Vit C 1,000 mg   are recommended to help protect  against the Covid-19 and other Corona viruses.    Also it's recommended  to take  Zinc 50 mg  to help  protect against the Covid-19   and best place to get  is also on [email or web address]  and don't pay more than 6-8 cents /pill !  ================================ Coronavirus (COVID-19) Are you at risk?  Are you at risk for the Coronavirus (COVID-19)?  To be considered HIGH RISK for Coronavirus (COVID-19), you have to meet the following criteria:   Traveled to [place], [place], [place], [place] or [place]; or in the [place] to [place], [place], [place]   or [place]; and have fever, cough, and shortness of breath within the last 2 weeks of travel OR  Been in close contact with a person diagnosed with COVID-19 within the last 2 weeks and have   fever, cough,and shortness of breath    IF YOU DO NOT MEET THESE CRITERIA, YOU ARE CONSIDERED LOW RISK FOR COVID-19.  What to do if you are HIGH RISK for COVID-19?   If you are having a medical emergency, call 911.  Seek medical care right away. Before you go to a doctors office, urgent care or emergency department,   call ahead and tell them about your recent travel, contact with someone diagnosed with COVID-19    and your symptoms.   You should receive instructions from your physicians  office regarding next steps of care.   When you arrive at healthcare provider, tell the healthcare staff immediately you have returned from   visiting [place], [place], [place], [place] or [place]; or traveled in the [place] to [place], [place],   [place] or [place] in the last two weeks or you have been in close contact with a person diagnosed with   COVID-19 in the last 2 weeks.    Tell the health care staff about your symptoms: fever, cough and shortness of breath.  After you have been seen by a medical provider, you will be either: o Tested for (COVID-19) and discharged home on quarantine except to seek medical care if  o symptoms worsen, and asked to  - Stay home and avoid contact with others until you get your results (4-5 days)  - Avoid travel on public transportation if possible (such as bus, train, or airplane) or o Sent to the Emergency Department by EMS for evaluation, COVID-19 testing  and  o possible admission depending on your condition and test results.  What to do if you are LOW RISK for COVID-19?  Reduce your risk of any infection by using the same precautions used for avoiding the common cold or flu:   Wash your hands often with soap and warm water for at least 20 seconds.  If soap and water are not readily  available,  . use an alcohol-based hand sanitizer with at least 60% alcohol.  . If coughing or sneezing, cover your mouth and nose by coughing or sneezing into the elbow areas of your shirt or coat, .  into a tissue or into your sleeve (not your hands). . Avoid shaking hands with others and consider head nods or verbal greetings only. . Avoid touching your eyes, nose, or mouth with unwashed hands.  . Avoid close contact with people who are sick. . Avoid places or events with large numbers of people in one location, like concerts or sporting events. . Carefully consider travel plans you have or are making. . If you are planning any travel outside or  inside the [place], visit the CDC's Travelers' Health webpage for the latest health notices. . If you have some symptoms but not all symptoms, continue to monitor at home and seek medical attention  . if your symptoms worsen. . If you are having a medical emergency, call 911.   . >>>>>>>>>>>>>>>>>>>>>>>>>>>>>>>>> . We Do NOT Approve of  [facility] [place] Soliciting Our Patients  To Do Home Visits & We Do NOT Approve of [employer] > > > > > > > > > > > > > > > > > > > > > > > > > > > > > > > > > > > > > > >  Preventive Care for Adults  A healthy lifestyle and preventive care can promote health and wellness. Preventive health guidelines for women include the following key practices.  A routine yearly physical is a good way to check with your health care provider about your health and preventive screening. It is a chance to share any concerns and updates on your health and to receive a thorough exam.  Visit your dentist for a routine exam and preventive care every 6 months. Brush your teeth twice a day and floss once a day. Good oral hygiene prevents tooth decay and gum disease.  The frequency of eye exams is based on your age, health, family medical history, use of contact lenses, and other factors. Follow your health care provider's recommendations for frequency of eye exams.  Eat a healthy diet. Foods like vegetables, fruits, whole grains, low-fat dairy products, and lean protein foods contain the nutrients you need without too many calories. Decrease your intake of foods high in solid fats, added sugars, and salt. Eat the right amount of calories for you. Get information about a proper diet from your health care provider, if necessary.  Regular physical exercise is one of the most important things you can do for your health. Most adults should get at least 150 minutes of moderate-intensity exercise (any activity that increases your heart rate and causes you to sweat) each  week. In addition, most adults need muscle-strengthening exercises on 2 or more days a week.  Maintain a healthy weight. The body mass index (BMI) is a screening tool to identify possible weight problems. It provides an estimate of body fat based on height and weight. Your health care provider can find your BMI and can help you achieve or maintain a healthy weight. For adults 20 years and older:  A BMI below 18.5 is considered underweight.  A BMI of 18.5 to 24.9 is normal.  A BMI of 25 to 29.9 is considered overweight.  A BMI of 30 and above is considered obese.  Maintain normal blood lipids and cholesterol levels by exercising and minimizing your intake of   saturated fat. Eat a balanced diet with plenty of fruit and vegetables. If your lipid or cholesterol levels are high, you are over 50, or you are at high risk for heart disease, you may need your cholesterol levels checked more frequently. Ongoing high lipid and cholesterol levels should be treated with medicines if diet and exercise are not working.  If you smoke, find out from your health care provider how to quit. If you do not use tobacco, do not start.  Lung cancer screening is recommended for adults aged 29-80 years who are at high risk for developing lung cancer because of a history of smoking. A yearly low-dose CT scan of the lungs is recommended for people who have at least a 30-pack-year history of smoking and are a current smoker or have quit within the past 15 years. A pack year of smoking is smoking an average of 1 pack of cigarettes a day for 1 year (for example: 1 pack a day for 30 years or 2 packs a day for 15 years). Yearly screening should continue until the smoker has stopped smoking for at least 15 years. Yearly screening should be stopped for people who develop a health problem that would prevent them from having lung cancer treatment.  Avoid use of street drugs. Do not share needles with anyone. Ask for help if you need  support or instructions about stopping the use of drugs.  High blood pressure causes heart disease and increases the risk of stroke.  Ongoing high blood pressure should be treated with medicines if weight loss and exercise do not work.  If you are 71-34 years old, ask your health care provider if you should take aspirin to prevent strokes.  Diabetes screening involves taking a blood sample to check your fasting blood sugar level. This should be done once every 3 years, after age 62, if you are within normal weight and without risk factors for diabetes. Testing should be considered at a younger age or be carried out more frequently if you are overweight and have at least 1 risk factor for diabetes.  Breast cancer screening is essential preventive care for women. You should practice "breast self-awareness." This means understanding the normal appearance and feel of your breasts and may include breast self-examination. Any changes detected, no matter how small, should be reported to a health care provider. Women in their 71s and 30s should have a clinical breast exam (CBE) by a health care provider as part of a regular health exam every 1 to 3 years. After age 98, women should have a CBE every year. Starting at age 47, women should consider having a mammogram (breast X-ray test) every year. Women who have a family history of breast cancer should talk to their health care provider about genetic screening. Women at a high risk of breast cancer should talk to their health care providers about having an MRI and a mammogram every year.  Breast cancer gene (BRCA)-related cancer risk assessment is recommended for women who have family members with BRCA-related cancers. BRCA-related cancers include breast, ovarian, tubal, and peritoneal cancers. Having family members with these cancers may be associated with an increased risk for harmful changes (mutations) in the breast cancer genes BRCA1 and BRCA2. Results of the  assessment will determine the need for genetic counseling and BRCA1 and BRCA2 testing.  Routine pelvic exams to screen for cancer are no longer recommended for nonpregnant women who are considered low risk for cancer of the pelvic organs (ovaries,  uterus, and vagina) and who do not have symptoms. Ask your health care provider if a screening pelvic exam is right for you.  If you have had past treatment for cervical cancer or a condition that could lead to cancer, you need Pap tests and screening for cancer for at least 20 years after your treatment. If Pap tests have been discontinued, your risk factors (such as having a new sexual partner) need to be reassessed to determine if screening should be resumed. Some women have medical problems that increase the chance of getting cervical cancer. In these cases, your health care provider may recommend more frequent screening and Pap tests.    Colorectal cancer can be detected and often prevented. Most routine colorectal cancer screening begins at the age of 22 years and continues through age 20 years. However, your health care provider may recommend screening at an earlier age if you have risk factors for colon cancer. On a yearly basis, your health care provider may provide home test kits to check for hidden blood in the stool. Use of a small camera at the end of a tube, to directly examine the colon (sigmoidoscopy or colonoscopy), can detect the earliest forms of colorectal cancer. Talk to your health care provider about this at age 70, when routine screening begins.  Direct exam of the colon should be repeated every 5-10 years through age 60 years, unless early forms of pre-cancerous polyps or small growths are found.  Osteoporosis is a disease in which the bones lose minerals and strength with aging. This can result in serious bone fractures or breaks. The risk of osteoporosis can be identified using a bone density scan. Women ages 23 years and over and women  at risk for fractures or osteoporosis should discuss screening with their health care providers. Ask your health care provider whether you should take a calcium supplement or vitamin D to reduce the rate of osteoporosis.  Menopause can be associated with physical symptoms and risks. Hormone replacement therapy is available to decrease symptoms and risks. You should talk to your health care provider about whether hormone replacement therapy is right for you.  Use sunscreen. Apply sunscreen liberally and repeatedly throughout the day. You should seek shade when your shadow is shorter than you. Protect yourself by wearing long sleeves, pants, a wide-brimmed hat, and sunglasses year round, whenever you are outdoors.  Once a month, do a whole body skin exam, using a mirror to look at the skin on your back. Tell your health care provider of new moles, moles that have irregular borders, moles that are larger than a pencil eraser, or moles that have changed in shape or color.  Stay current with required vaccines (immunizations).  Influenza vaccine. All adults should be immunized every year.  Tetanus, diphtheria, and acellular pertussis (Td, Tdap) vaccine. Pregnant women should receive 1 dose of Tdap vaccine during each pregnancy. The dose should be obtained regardless of the length of time since the last dose. Immunization is preferred during the 27th-36th week of gestation. An adult who has not previously received Tdap or who does not know her vaccine status should receive 1 dose of Tdap. This initial dose should be followed by tetanus and diphtheria toxoids (Td) booster doses every 10 years. Adults with an unknown or incomplete history of completing a 3-dose immunization series with Td-containing vaccines should begin or complete a primary immunization series including a Tdap dose. Adults should receive a Td booster every 10 years.  Zoster vaccine. One dose is recommended for adults aged 55 years or  older unless certain conditions are present.    Pneumococcal 13-valent conjugate (PCV13) vaccine. When indicated, a person who is uncertain of her immunization history and has no record of immunization should receive the PCV13 vaccine. An adult aged 60 years or older who has certain medical conditions and has not been previously immunized should receive 1 dose of PCV13 vaccine. This PCV13 should be followed with a dose of pneumococcal polysaccharide (PPSV23) vaccine. The PPSV23 vaccine dose should be obtained at least 1 or more year(s) after the dose of PCV13 vaccine. An adult aged 9 years or older who has certain medical conditions and previously received 1 or more doses of PPSV23 vaccine should receive 1 dose of PCV13. The PCV13 vaccine dose should be obtained 1 or more years after the last PPSV23 vaccine dose.    Pneumococcal polysaccharide (PPSV23) vaccine. When PCV13 is also indicated, PCV13 should be obtained first. All adults aged 3 years and older should be immunized. An adult younger than age 43 years who has certain medical conditions should be immunized. Any person who resides in a nursing home or long-term care facility should be immunized. An adult smoker should be immunized. People with an immunocompromised condition and certain other conditions should receive both PCV13 and PPSV23 vaccines. People with human immunodeficiency virus (HIV) infection should be immunized as soon as possible after diagnosis. Immunization during chemotherapy or radiation therapy should be avoided. Routine use of PPSV23 vaccine is not recommended for American Indians, [place] Natives, or people younger than 65 years unless there are medical conditions that require PPSV23 vaccine. When indicated, people who have unknown immunization and have no record of immunization should receive PPSV23 vaccine. One-time revaccination 5 years after the first dose of PPSV23 is recommended for people aged 19-64 years who have chronic  kidney failure, nephrotic syndrome, asplenia, or immunocompromised conditions. People who received 1-2 doses of PPSV23 before age 43 years should receive another dose of PPSV23 vaccine at age 63 years or later if at least 5 years have passed since the previous dose. Doses of PPSV23 are not needed for people immunized with PPSV23 at or after age 43 years.   Preventive Services / Frequency  Ages 56 years and over  Blood pressure check.  Lipid and cholesterol check.  Lung cancer screening. / Every year if you are aged 74-80 years and have a 30-pack-year history of smoking and currently smoke or have quit within the past 15 years. Yearly screening is stopped once you have quit smoking for at least 15 years or develop a health problem that would prevent you from having lung cancer treatment.  Clinical breast exam.** / Every year after age 37 years.   BRCA-related cancer risk assessment.** / For women who have family members with a BRCA-related cancer (breast, ovarian, tubal, or peritoneal cancers).  Mammogram.** / Every year beginning at age 53 years and continuing for as long as you are in good health. Consult with your health care provider.  Pap test.** / Every 3 years starting at age 65 years through age 61 or 7 years with 3 consecutive normal Pap tests. Testing can be stopped between 65 and 70 years with 3 consecutive normal Pap tests and no abnormal Pap or HPV tests in the past 10 years.  Fecal occult blood test (FOBT) of stool. / Every year beginning at age 29 years and continuing until age 18 years. You may not need to  do this test if you get a colonoscopy every 10 years.  Flexible sigmoidoscopy or colonoscopy.** / Every 5 years for a flexible sigmoidoscopy or every 10 years for a colonoscopy beginning at age 50 years and continuing until age 75 years.  Hepatitis C blood test.** / For all people born from 1945 through 1965 and any individual with known risks for hepatitis  C.  Osteoporosis screening.** / A one-time screening for women ages 65 years and over and women at risk for fractures or osteoporosis.  Skin self-exam. / Monthly.  Influenza vaccine. / Every year.  Tetanus, diphtheria, and acellular pertussis (Tdap/Td) vaccine.** / 1 dose of Td every 10 years.  Zoster vaccine.** / 1 dose for adults aged 60 years or older.  Pneumococcal 13-valent conjugate (PCV13) vaccine.** / Consult your health care provider.  Pneumococcal polysaccharide (PPSV23) vaccine.** / 1 dose for all adults aged 65 years and older. Screening for abdominal aortic aneurysm (AAA)  by ultrasound is recommended for people who have history of high blood pressure or who are current or former smokers. ++++++++++++++++++++ Recommend Adult Low Dose Aspirin or  coated  Aspirin 81 mg daily  To reduce risk of Colon Cancer 40 %,  Skin Cancer 26 % ,  Melanoma 46%  and  Pancreatic cancer 60% ++++++++++++++++++++ Vitamin D goal  is between 70-100.  Please make sure that you are taking your Vitamin D as directed.  It is very important as a natural anti-inflammatory  helping hair, skin, and nails, as well as reducing stroke and heart attack risk.  It helps your bones and helps with mood. It also decreases numerous cancer risks so please take it as directed.  Low Vit D is associated with a 200-300% higher risk for CANCER  and 200-300% higher risk for HEART   ATTACK  &  STROKE.   ...................................... It is also associated with higher death rate at younger ages,  autoimmune diseases like Rheumatoid arthritis, Lupus, Multiple Sclerosis.    Also many other serious conditions, like depression, Alzheimer's Dementia, infertility, muscle aches, fatigue, fibromyalgia - just to name a few. ++++++++++++++++++ Recommend the book "The END of DIETING" by Dr Joel Fuhrman  & the book "The END of DIABETES " by Dr Joel Fuhrman At [employer]com - get book & Audio CD's    Being diabetic has  a  300% increased risk for heart attack, stroke, cancer, and alzheimer- type vascular dementia. It is very important that you work harder with diet by avoiding all foods that are white. Avoid white rice (brown & wild rice is OK), white potatoes (sweetpotatoes in moderation is OK), White bread or wheat bread or anything made out of white flour like bagels, donuts, rolls, buns, biscuits, cakes, pastries, cookies, pizza crust, and pasta (made from white flour & egg whites) - vegetarian pasta or spinach or wheat pasta is OK. Multigrain breads like Arnold's or [employer], or multigrain sandwich thins or flatbreads.  Diet, exercise and weight loss can reverse and cure diabetes in the early stages.  Diet, exercise and weight loss is very important in the control and prevention of complications of diabetes which affects every system in your body, ie. Brain - dementia/stroke, eyes - glaucoma/blindness, heart - heart attack/heart failure, kidneys - dialysis, stomach - gastric paralysis, intestines - malabsorption, nerves - severe painful neuritis, circulation - gangrene & loss of a leg(s), and finally cancer and Alzheimers.    I recommend avoid fried & greasy foods,  sweets/candy, white rice (brown or wild   rice or Quinoa is OK), white potatoes (sweet potatoes are OK) - anything made from white flour - bagels, doughnuts, rolls, buns, biscuits,white and wheat breads, pizza crust and traditional pasta made of white flour & egg white(vegetarian pasta or spinach or wheat pasta is OK).  Multi-grain bread is OK - like multi-grain flat bread or sandwich thins. Avoid alcohol in excess. Exercise is also important.    Eat all the vegetables you want - avoid meat, especially red meat and dairy - especially cheese.  Cheese is the most concentrated form of trans-fats which is the worst thing to clog up our arteries. Veggie cheese is OK which can be found in the fresh produce section at [employer] or [employer] or  [employer]  +++++++++++++++++++ DASH Eating Plan  DASH stands for "Dietary Approaches to Stop Hypertension."   The DASH eating plan is a healthy eating plan that has been shown to reduce high blood pressure (hypertension). Additional health benefits may include reducing the risk of type 2 diabetes mellitus, heart disease, and stroke. The DASH eating plan may also help with weight loss. WHAT DO I NEED TO KNOW ABOUT THE DASH EATING PLAN? For the DASH eating plan, you will follow these general guidelines:  Choose foods with a percent daily value for sodium of less than 5% (as listed on the food label).  Use salt-free seasonings or herbs instead of table salt or sea salt.  Check with your health care provider or pharmacist before using salt substitutes.  Eat lower-sodium products, often labeled as "lower sodium" or "no salt added."  Eat fresh foods.  Eat more vegetables, fruits, and low-fat dairy products.  Choose whole grains. Look for the word "whole" as the first word in the ingredient list.  Choose fish   Limit sweets, desserts, sugars, and sugary drinks.  Choose heart-healthy fats.  Eat veggie cheese   Eat more home-cooked food and less restaurant, buffet, and fast food.  Limit fried foods.  Cook foods using methods other than frying.  Limit canned vegetables. If you do use them, rinse them well to decrease the sodium.  When eating at a restaurant, ask that your food be prepared with less salt, or no salt if possible.                      WHAT FOODS CAN I EAT? Read Dr Joel Fuhrman's books on The End of Dieting & The End of Diabetes  Grains Whole grain or whole wheat bread. Brown rice. Whole grain or whole wheat pasta. Quinoa, bulgur, and whole grain cereals. Low-sodium cereals. Corn or whole wheat flour tortillas. Whole grain cornbread. Whole grain crackers. Low-sodium crackers.  Vegetables Fresh or frozen vegetables (raw, steamed, roasted, or grilled). Low-sodium or  reduced-sodium tomato and vegetable juices. Low-sodium or reduced-sodium tomato sauce and paste. Low-sodium or reduced-sodium canned vegetables.   Fruits All fresh, canned (in natural juice), or frozen fruits.  Protein Products  All fish and seafood.  Dried beans, peas, or lentils. Unsalted nuts and seeds. Unsalted canned beans.  Dairy Low-fat dairy products, such as skim or 1% milk, 2% or reduced-fat cheeses, low-fat ricotta or cottage cheese, or plain low-fat yogurt. Low-sodium or reduced-sodium cheeses.  Fats and Oils Tub margarines without trans fats. Light or reduced-fat mayonnaise and salad dressings (reduced sodium). Avocado. Safflower, olive, or canola oils. Natural peanut or almond butter.  Other Unsalted popcorn and pretzels. The items listed above may not be a complete list of recommended foods   or beverages. Contact your dietitian for more options.  +++++++++++++++  WHAT FOODS ARE NOT RECOMMENDED? Grains/ White flour or wheat flour White bread. White pasta. White rice. Refined cornbread. Bagels and croissants. Crackers that contain trans fat.  Vegetables  Creamed or fried vegetables. Vegetables in a . Regular canned vegetables. Regular canned tomato sauce and paste. Regular tomato and vegetable juices.  Fruits Dried fruits. Canned fruit in light or heavy syrup. Fruit juice.  Meat and Other Protein Products Meat in general - RED meat & White meat.  Fatty cuts of meat. Ribs, chicken wings, all processed meats as bacon, sausage, bologna, salami, fatback, hot dogs, bratwurst and packaged luncheon meats.  Dairy Whole or 2% milk, cream, half-and-half, and cream cheese. Whole-fat or sweetened yogurt. Full-fat cheeses or blue cheese. Non-dairy creamers and whipped toppings. Processed cheese, cheese spreads, or cheese curds.  Condiments Onion and garlic salt, seasoned salt, table salt, and sea salt. Canned and packaged gravies. Worcestershire sauce. Tartar sauce. Barbecue  sauce. Teriyaki sauce. Soy sauce, including reduced sodium. Steak sauce. Fish sauce. Oyster sauce. Cocktail sauce. Horseradish. Ketchup and mustard. Meat flavorings and tenderizers. Bouillon cubes. Hot sauce. Tabasco sauce. Marinades. Taco seasonings. Relishes.  Fats and Oils Butter, stick margarine, lard, shortening and bacon fat. Coconut, palm kernel, or palm oils. Regular salad dressings.  Pickles and olives. Salted popcorn and pretzels.  The items listed above may not be a complete list of foods and beverages to avoid.    

## 2020-03-16 LAB — HEMOGLOBIN A1C
Hgb A1c MFr Bld: 5.5 % of total Hgb (ref <5.7)
Mean Plasma Glucose: 111 (calc)
eAG (mmol/L): 6.2 (calc)

## 2020-03-16 LAB — LIPID PANEL
Cholesterol: 172 mg/dL (ref <200)
HDL: 73 mg/dL (ref >=50)
LDL Cholesterol (Calc): 69 mg/dL (calc)
Non-HDL Cholesterol (Calc): 99 mg/dL (calc) (ref <130)
Total CHOL/HDL Ratio: 2.4 (calc) (ref <5.0)
Triglycerides: 259 mg/dL — ABNORMAL HIGH (ref <150)

## 2020-03-16 LAB — INSULIN, RANDOM: Insulin: 7 u[IU]/mL

## 2020-03-16 LAB — MAGNESIUM: Magnesium: 2 mg/dL (ref 1.5–2.5)

## 2020-03-16 LAB — IRON, TOTAL/TOTAL IRON BINDING CAP
%SAT: 26 % (calc) (ref 16–45)
Iron: 91 ug/dL (ref 45–160)
TIBC: 347 mcg/dL (calc) (ref 250–450)

## 2020-03-16 LAB — VITAMIN B12: Vitamin B-12: 389 pg/mL (ref 200–1100)

## 2020-03-16 NOTE — Progress Notes (Signed)
========================================================== °-   Test results slightly outside the reference range are not unusual. If there is anything important, I will review this with you,  otherwise it is considered normal test values.  If you have further questions,  please do not hesitate to contact me at the office or via My Chart.  ==========================================================  -  Iron is Normal & OK  ==========================================================  -  Vitamin B12 =  389    Very Low  (Ideal or Goal Vit B12 is between 450 - 1,100)   Low Vit B12 may be associated with Anemia, Fatigue,   Peripheral Neuropathy, Dementia, "Brain Fog", & Depression  - Recommend take a sub-lingual form of Vitamin B12 tablet   1,000 to 5,000 mcg tab that you dissolve under your tongue /Daily   - Can get Baron Sane - best price at [employer] or on [employer] ==========================================================  -  Total Chol = 172 and LDL Chol = 69 - Both  Excellent   - Very low risk for Heart Attack  / Stroke ==========================================================  - But Triglycerides (   259   ) or fats in blood are too high  (goal is less than 150)    - Recommend avoid fried & greasy foods,  sweets / candy,   - Avoid white rice  (brown or wild rice or Quinoa is OK),   - Avoid white potatoes  (sweet potatoes are OK)   - Avoid anything made from white flour  - bagels, doughnuts, rolls, buns, biscuits, white and   wheat breads, pizza crust and traditional  pasta made of white flour & egg white  - (vegetarian pasta or spinach or wheat pasta is OK).    - Multi-grain bread is OK - like multi-grain flat bread or  sandwich thins.   - Avoid alcohol in excess.   - Exercise is also important. ==========================================================  -  A1c = 5.5% - Normal- Great ! ==========================================================  -  Keep up the  [place] Work  ! ========================================================== ==========================================================

## 2020-03-24 DIAGNOSIS — M5022 Other cervical disc displacement, mid-cervical region, unspecified level: Secondary | ICD-10-CM | POA: Diagnosis not present

## 2020-03-24 DIAGNOSIS — M542 Cervicalgia: Secondary | ICD-10-CM | POA: Diagnosis not present

## 2020-03-28 ENCOUNTER — Other Ambulatory Visit: Payer: Self-pay | Admitting: Internal Medicine

## 2020-03-28 DIAGNOSIS — M545 Low back pain, unspecified: Secondary | ICD-10-CM

## 2020-03-28 DIAGNOSIS — G8929 Other chronic pain: Secondary | ICD-10-CM

## 2020-03-28 MED ORDER — TOPIRAMATE 50 MG PO TABS: ORAL_TABLET | ORAL | 0 refills | Status: DC

## 2020-03-28 MED ORDER — GABAPENTIN 300 MG PO CAPS: ORAL_CAPSULE | ORAL | 0 refills | Status: DC

## 2020-03-29 ENCOUNTER — Other Ambulatory Visit: Payer: Self-pay | Admitting: Internal Medicine

## 2020-03-29 MED ORDER — CIPROFLOXACIN HCL 250 MG PO TABS: ORAL_TABLET | ORAL | 0 refills | Status: DC

## 2020-04-05 DIAGNOSIS — Z803 Family history of malignant neoplasm of breast: Secondary | ICD-10-CM | POA: Diagnosis not present

## 2020-04-05 DIAGNOSIS — Z1231 Encounter for screening mammogram for malignant neoplasm of breast: Secondary | ICD-10-CM | POA: Diagnosis not present

## 2020-04-05 LAB — HM MAMMOGRAPHY

## 2020-05-05 ENCOUNTER — Other Ambulatory Visit: Payer: Self-pay | Admitting: Internal Medicine

## 2020-05-17 ENCOUNTER — Other Ambulatory Visit: Payer: Self-pay | Admitting: Internal Medicine

## 2020-05-17 MED ORDER — PHENTERMINE HCL 37.5 MG PO TABS
ORAL_TABLET | ORAL | 1 refills | Status: DC
Start: 2020-05-17 — End: 2020-09-03

## 2020-05-18 ENCOUNTER — Other Ambulatory Visit: Payer: Self-pay | Admitting: Internal Medicine

## 2020-05-18 MED ORDER — TOPIRAMATE 50 MG PO TABS
ORAL_TABLET | ORAL | 0 refills | Status: DC
Start: 2020-05-18 — End: 2020-08-13

## 2020-05-22 DIAGNOSIS — Z20822 Contact with and (suspected) exposure to covid-19: Secondary | ICD-10-CM | POA: Diagnosis not present

## 2020-06-02 DIAGNOSIS — M5022 Other cervical disc displacement, mid-cervical region, unspecified level: Secondary | ICD-10-CM | POA: Diagnosis not present

## 2020-06-02 DIAGNOSIS — M542 Cervicalgia: Secondary | ICD-10-CM | POA: Diagnosis not present

## 2020-06-06 ENCOUNTER — Other Ambulatory Visit: Payer: Self-pay | Admitting: Internal Medicine

## 2020-06-06 DIAGNOSIS — G4700 Insomnia, unspecified: Secondary | ICD-10-CM

## 2020-06-06 MED ORDER — ROSUVASTATIN CALCIUM 20 MG PO TABS: ORAL_TABLET | ORAL | 0 refills | Status: DC

## 2020-06-06 MED ORDER — TRAZODONE HCL 150 MG PO TABS: ORAL_TABLET | ORAL | 0 refills | Status: DC

## 2020-06-16 ENCOUNTER — Ambulatory Visit: Payer: BC Managed Care – PPO | Admitting: Adult Health

## 2020-06-17 ENCOUNTER — Other Ambulatory Visit: Payer: Self-pay | Admitting: Internal Medicine

## 2020-06-17 DIAGNOSIS — G8929 Other chronic pain: Secondary | ICD-10-CM

## 2020-06-17 DIAGNOSIS — M545 Low back pain, unspecified: Secondary | ICD-10-CM

## 2020-06-17 MED ORDER — GABAPENTIN 300 MG PO CAPS
ORAL_CAPSULE | ORAL | 0 refills | Status: DC
Start: 2020-06-17 — End: 2020-09-25

## 2020-06-23 DIAGNOSIS — M545 Low back pain, unspecified: Secondary | ICD-10-CM | POA: Diagnosis not present

## 2020-06-23 DIAGNOSIS — M5136 Other intervertebral disc degeneration, lumbar region: Secondary | ICD-10-CM | POA: Diagnosis not present

## 2020-06-27 DIAGNOSIS — H04123 Dry eye syndrome of bilateral lacrimal glands: Secondary | ICD-10-CM | POA: Diagnosis not present

## 2020-06-27 DIAGNOSIS — H524 Presbyopia: Secondary | ICD-10-CM | POA: Diagnosis not present

## 2020-07-04 ENCOUNTER — Other Ambulatory Visit: Payer: Self-pay | Admitting: Adult Health Nurse Practitioner

## 2020-07-13 ENCOUNTER — Other Ambulatory Visit: Payer: Self-pay | Admitting: Internal Medicine

## 2020-07-13 MED ORDER — LUBIPROSTONE 24 MCG PO CAPS: ORAL_CAPSULE | ORAL | 0 refills | Status: DC

## 2020-07-20 ENCOUNTER — Telehealth: Payer: Self-pay | Admitting: *Deleted

## 2020-07-20 NOTE — Telephone Encounter (Signed)
Informed patient Linzess not approved by her insurance and patient agreed to try Amitiza. Patient advised to call back if Amitiza not effective.

## 2020-08-10 ENCOUNTER — Telehealth: Payer: Self-pay | Admitting: *Deleted

## 2020-08-10 NOTE — Telephone Encounter (Signed)
Patient called and reported since starting Amitiza, she is having nausea and more difficulty with constipation. Informed patient we can try to get Linzess approved, which she tolerated better.

## 2020-08-13 ENCOUNTER — Other Ambulatory Visit: Payer: Self-pay | Admitting: Internal Medicine

## 2020-08-13 MED ORDER — TOPIRAMATE 50 MG PO TABS: ORAL_TABLET | ORAL | 1 refills | Status: DC

## 2020-08-16 ENCOUNTER — Other Ambulatory Visit: Payer: Self-pay | Admitting: Internal Medicine

## 2020-08-16 MED ORDER — TOPIRAMATE 50 MG PO TABS: ORAL_TABLET | ORAL | 1 refills | Status: DC

## 2020-08-24 ENCOUNTER — Telehealth: Payer: Self-pay | Admitting: *Deleted

## 2020-08-24 NOTE — Telephone Encounter (Signed)
Patient is aware of Linzess approval 08/21/2020 to 08/20/2021. Approval faxed to [phone].

## 2020-08-27 ENCOUNTER — Other Ambulatory Visit: Payer: Self-pay | Admitting: Internal Medicine

## 2020-08-27 DIAGNOSIS — E782 Mixed hyperlipidemia: Secondary | ICD-10-CM

## 2020-08-27 DIAGNOSIS — G4700 Insomnia, unspecified: Secondary | ICD-10-CM

## 2020-08-27 MED ORDER — ROSUVASTATIN CALCIUM 20 MG PO TABS
ORAL_TABLET | ORAL | 3 refills | Status: DC
Start: 2020-08-27 — End: 2020-08-30

## 2020-08-27 MED ORDER — TRAZODONE HCL 150 MG PO TABS: ORAL_TABLET | ORAL | 3 refills | Status: DC

## 2020-08-30 ENCOUNTER — Other Ambulatory Visit: Payer: Self-pay | Admitting: Internal Medicine

## 2020-08-30 DIAGNOSIS — G4700 Insomnia, unspecified: Secondary | ICD-10-CM

## 2020-08-30 DIAGNOSIS — E782 Mixed hyperlipidemia: Secondary | ICD-10-CM

## 2020-08-30 MED ORDER — TRAZODONE HCL 150 MG PO TABS: ORAL_TABLET | ORAL | 3 refills | Status: DC

## 2020-08-30 MED ORDER — ROSUVASTATIN CALCIUM 20 MG PO TABS: ORAL_TABLET | ORAL | 3 refills | Status: DC

## 2020-09-03 ENCOUNTER — Other Ambulatory Visit: Payer: Self-pay | Admitting: Internal Medicine

## 2020-09-03 DIAGNOSIS — K581 Irritable bowel syndrome with constipation: Secondary | ICD-10-CM

## 2020-09-03 DIAGNOSIS — K5909 Other constipation: Secondary | ICD-10-CM

## 2020-09-03 DIAGNOSIS — E782 Mixed hyperlipidemia: Secondary | ICD-10-CM

## 2020-09-03 DIAGNOSIS — G4700 Insomnia, unspecified: Secondary | ICD-10-CM

## 2020-09-03 MED ORDER — LUBIPROSTONE 24 MCG PO CAPS: ORAL_CAPSULE | ORAL | 3 refills | Status: DC

## 2020-09-03 MED ORDER — ROSUVASTATIN CALCIUM 20 MG PO TABS: ORAL_TABLET | ORAL | 3 refills | Status: DC

## 2020-09-03 MED ORDER — PHENTERMINE HCL 37.5 MG PO TABS: ORAL_TABLET | ORAL | 1 refills | Status: DC

## 2020-09-03 MED ORDER — TRAZODONE HCL 150 MG PO TABS: ORAL_TABLET | ORAL | 3 refills | Status: DC

## 2020-09-06 ENCOUNTER — Telehealth: Payer: Self-pay | Admitting: *Deleted

## 2020-09-06 NOTE — Telephone Encounter (Signed)
Informed patient she can use [employer] to pay for Phentermine, since Dr Melford Aase does not prior authorize this medication.

## 2020-09-07 ENCOUNTER — Other Ambulatory Visit: Payer: Self-pay | Admitting: Internal Medicine

## 2020-09-07 DIAGNOSIS — E66812 Obesity, class 2: Secondary | ICD-10-CM

## 2020-09-07 MED ORDER — PHENTERMINE HCL 37.5 MG PO TABS: ORAL_TABLET | ORAL | 1 refills | Status: DC

## 2020-09-25 ENCOUNTER — Other Ambulatory Visit: Payer: Self-pay

## 2020-09-25 DIAGNOSIS — M545 Low back pain, unspecified: Secondary | ICD-10-CM

## 2020-09-25 DIAGNOSIS — G8929 Other chronic pain: Secondary | ICD-10-CM

## 2020-09-25 MED ORDER — GABAPENTIN 300 MG PO CAPS: ORAL_CAPSULE | ORAL | 0 refills | Status: DC

## 2020-09-25 NOTE — Progress Notes (Signed)
Future Appointments  Date Time Provider [place]  09/26/2020  9:30 AM Unk Pinto, MD [facility] None  03/21/2021  2:00 PM Unk Pinto, MD [facility] None     History of Present Illness:       This very nice 55 y.o. single WF presents for 6 month follow up with labile HTN, HLD, Pre-Diabetes and Vitamin D Deficiency. Patient is also followed with IBS-C controlled on Linzess and her GERD is controlled on Protonix.  She also has chronic LBP & hx/o Insomnia. She reports Gabapentin significantly helps her LBP & Insomnia, but is interested in also trying Cymbalta.        Patient has been followed since 2004 for labile HTN & BP has been controlled at home. Today's BP is at goal - 130/90.  Patient has CKD3b (ZTI45) and is followed by Dr Raymondo Band. Patient has had no complaints of any cardiac type chest pain, palpitations, dyspnea / orthopnea / PND, dizziness, claudication, or dependent edema.       Hyperlipidemia is controlled with diet & Rosuvastatin. Patient denies myalgias or other med SE's. Last Lipids were at goal except elevated Trig's:  Lab Results  Component Value Date   CHOL 172 03/15/2020   HDL 73 03/15/2020   LDLCALC 69 03/15/2020   TRIG 259 (H) 03/15/2020   CHOLHDL 2.4 03/15/2020     Also, the patient is followed expectantly for glucose intolerance and has had no symptoms of reactive hypoglycemia, diabetic polys, paresthesias or visual blurring.  Last A1c was normal & at goal:  Lab Results  Component Value Date   HGBA1C 5.5 03/15/2020             Further, the patient also has history of Vitamin D Deficiency ("36"/2012) and supplements vitamin D without any suspected side-effects. Last vitamin D was still low  (goal 70-100):  Lab Results  Component Value Date   VD25OH 36 12/13/2019    Current Outpatient Medications on File Prior to Visit  Medication Sig  . buPROPion (WELLBUTRIN XL) 300 MG 24 hr tablet TAKE 1 TABLET EVERY MORNING FOR  MOOD, FOCUS AND CONCENTRATION  . cyclobenzaprine   10 MG tablet TAKE 1/2 TO 1 TABLET 3 X /DAY AS NEEDED   . FLONASE  nasal spray Place 2 sprays into both nostrils as needed  . gabapentin   300 MG capsule Take 1 capsules 2 to 3 x /day as needed for   . LINZESS 145 MCG CAPS  Take 1 daily.  . pantoprazole   40 MG tablet TAKE 1 TABLET 2 TIMES DAILY   . phentermine  37.5 MG tablet Take 1 tablet every morning  . rosuvastatin   20 MG tablet Take  1 tablet  Daily    . topiramate  50 MG tablet Take  1 tablet  2 x /day    . traZODone  150 MG tablet Take  2 tablets  1 hour  before Bedtime as needed for Sleep  . valACYclovir  500 MG tablet Take 1 tablet Daily prophylaxis for Cold Sores      Allergies  Allergen Reactions  . Atenolol Other (See Comments)    Made patient feel like a "zombie"  . Ambien [Zolpidem Tartrate]     Memory issues  . Cholestatin     Hot flashes  . Mirapex [Pramipexole Dihydrochloride]     Hot flashes  . Requip [Ropinirole Hcl]     Hot flashes    PMHx:   Past  Medical History:  Diagnosis Date  . Allergy    SEASONAL  . Anxiety   . Back injury   . Barrett's esophagus   . Chest pain, unspecified    normal myoview 2000;  echo 5/10:  mild LVH, EF 60-65%  . DDD (degenerative disc disease)   . Depression   . GERD (gastroesophageal reflux disease)   . Hiatal hernia   . HSV-1 (herpes simplex virus 1) infection   . Hyperlipidemia   . IBS (irritable bowel syndrome)   . Insomnia   . Lumbago   . Melanosis coli   . Palpitations    h/o symptomatic PVCs  . Palpitations   . Persistent disorder of initiating or maintaining sleep   . Restless legs syndrome (RLS)     Immunization History  Administered Date(s) Administered  . Influenza Inj Mdck Quad  04/15/2019  . Influenza Split 04/15/2013, 04/27/2014  . Influenza, Seasonal 04/23/2016  . Influenza,inj,Quad PF,6+ Mos 04/06/2020  . Influenza-Unspecified 03/27/2012  . PFIZERSARS-COV-2 Vacci 08/13/2019, 09/03/2019,  04/06/2020  . PPD Test 11/14/2017, 12/24/2018, 03/15/2020  . Pneumococcal- 23 05/27/2009  . Td 05/27/2001  . Tdap 04/08/2012  . Zoster Recombinat (Shingrix) 04/18/2020    Past Surgical History:  Procedure Laterality Date  . COLONOSCOPY  2013  . ESOPHAGOGASTRODUODENOSCOPY N/A 03/04/2014   Procedure: ESOPHAGOGASTRODUODENOSCOPY (EGD);  Surgeon: Ladene Artist, MD;  Location: [facility];  Service: Endoscopy;  Laterality: N/A;  . EYE SURGERY  2001  . REFRACTIVE SURGERY     bilateral eyes   . TUBES IN EARS     as an adult  . UPPER GASTROINTESTINAL ENDOSCOPY      FHx:    Reviewed / unchanged  SHx:    Reviewed / unchanged   Systems Review:  Constitutional: Denies fever, chills, wt changes, headaches, insomnia, fatigue, night sweats, change in appetite. Eyes: Denies redness, blurred vision, diplopia, discharge, itchy, watery eyes.  ENT: Denies discharge, congestion, post nasal drip, epistaxis, sore throat, earache, hearing loss, dental pain, tinnitus, vertigo, sinus pain, snoring.  CV: Denies chest pain, palpitations, irregular heartbeat, syncope, dyspnea, diaphoresis, orthopnea, PND, claudication or edema. Respiratory: denies cough, dyspnea, DOE, pleurisy, hoarseness, laryngitis, wheezing.  Gastrointestinal: Denies dysphagia, odynophagia, heartburn, reflux, water brash, abdominal pain or cramps, nausea, vomiting, bloating, diarrhea, constipation, hematemesis, melena, hematochezia  or hemorrhoids. Genitourinary: Denies dysuria, frequency, urgency, nocturia, hesitancy, discharge, hematuria or flank pain. Musculoskeletal: Denies arthralgias, myalgias, stiffness, jt. swelling, pain, limping or strain/sprain.  Skin: Denies pruritus, rash, hives, warts, acne, eczema or change in skin lesion(s). Neuro: No weakness, tremor, incoordination, spasms, paresthesia or pain. Psychiatric: Denies confusion, memory loss or sensory loss. Endo: Denies change in weight, skin or hair change.  Heme/Lymph:  No excessive bleeding, bruising or enlarged lymph nodes.  Physical Exam  BP 130/90   Pulse 98   Temp 97.7 F (36.5 C)   Resp 16   Ht 5' 8.75" (1.746 m)   Wt 154 lb 12.8 oz (70.2 kg)   LMP 12/26/2015   SpO2 96%   BMI 23.03 kg/m   Appears  well nourished, well groomed  and in no distress.  Eyes: PERRLA, EOMs, conjunctiva no swelling or erythema. Sinuses: No frontal/maxillary tenderness ENT/Mouth: EAC's clear, TM's nl w/o erythema, bulging. Nares clear w/o erythema, swelling, exudates. Oropharynx clear without erythema or exudates. Oral hygiene is good. Tongue normal, non obstructing. Hearing intact.  Neck: Supple. Thyroid not palpable. Car 2+/2+ without bruits, nodes or JVD. Chest: Respirations nl with BS clear & equal w/o rales,  rhonchi, wheezing or stridor.  Cor: Heart sounds normal w/ regular rate and rhythm without sig. murmurs, gallops, clicks or rubs. Peripheral pulses normal and equal  without edema.  Abdomen: Soft & bowel sounds normal. Non-tender w/o guarding, rebound, hernias, masses or organomegaly.  Lymphatics: Unremarkable.  Musculoskeletal: Full ROM all peripheral extremities, joint stability, 5/5 strength and normal gait.  Skin: Warm, dry without exposed rashes, lesions or ecchymosis apparent.  Neuro: Cranial nerves intact, reflexes equal bilaterally. Sensory-motor testing grossly intact. Tendon reflexes grossly intact.  Pysch: Alert & oriented x 3.  Insight and judgement nl & appropriate. No ideations.  Assessment and Plan:  1. Labile hypertension  - Continue medication, monitor blood pressure at home.  - Continue DASH diet.  Reminder to go to the ER if any CP,  SOB, nausea, dizziness, severe HA, changes vision/speech.  - COMPLETE METABOLIC PANEL WITH GFR - Magnesium - TSH  2. Hyperlipidemia, mixed  - Continue diet/meds, exercise,& lifestyle modifications.  - Continue monitor periodic cholesterol/liver & renal functions   - Lipid panel - TSH  3.  Abnormal glucose  - Continue diet, exercise  - Lifestyle modifications.  - Monitor appropriate labs  - Hemoglobin A1c - Insulin, random  4. Vitamin D deficiency  - Continue supplementation.  - VITAMIN D 25 Hydroxy  5. Stage 3b chronic kidney disease (HCC)  - COMPLETE METABOLIC PANEL WITH GFR  6. Gastroesophageal reflux disease   - CBC with Differential/Platelet  7. Irritable bowel syndrome with constipation   8. Medication management  - CBC with Differential/Platelet - COMPLETE METABOLIC PANEL WITH GFR - Magnesium - Lipid panel - TSH - Hemoglobin A1c - Insulin, random - VITAMIN D 25 Hydroxy         Discussed  regular exercise, BP monitoring, weight control to achieve/maintain BMI less than 25 and discussed med and SE's. Recommended labs to assess and monitor clinical status with further disposition pending results of labs.  I discussed the assessment and treatment plan with the patient. The patient was provided an opportunity to ask questions and all were answered. The patient agreed with the plan and demonstrated an understanding of the instructions.  I provided over 30 minutes of exam, counseling, chart review and  complex critical decision making.        The patient was advised to call back or seek an in-person evaluation if the symptoms worsen or if the condition fails to improve as anticipated.   Kirtland Bouchard, MD

## 2020-09-25 NOTE — Patient Instructions (Signed)

## 2020-09-26 ENCOUNTER — Other Ambulatory Visit: Payer: Self-pay

## 2020-09-26 ENCOUNTER — Ambulatory Visit: Payer: BC Managed Care – PPO | Admitting: Internal Medicine

## 2020-09-26 ENCOUNTER — Encounter: Payer: Self-pay | Admitting: Internal Medicine

## 2020-09-26 VITALS — BP 130/90 | HR 98 | Temp 97.7°F | Resp 16 | Ht 68.75 in | Wt 154.8 lb

## 2020-09-26 DIAGNOSIS — R0989 Other specified symptoms and signs involving the circulatory and respiratory systems: Secondary | ICD-10-CM | POA: Diagnosis not present

## 2020-09-26 DIAGNOSIS — E782 Mixed hyperlipidemia: Secondary | ICD-10-CM

## 2020-09-26 DIAGNOSIS — K581 Irritable bowel syndrome with constipation: Secondary | ICD-10-CM

## 2020-09-26 DIAGNOSIS — N1832 Chronic kidney disease, stage 3b: Secondary | ICD-10-CM

## 2020-09-26 DIAGNOSIS — E559 Vitamin D deficiency, unspecified: Secondary | ICD-10-CM

## 2020-09-26 DIAGNOSIS — Z79899 Other long term (current) drug therapy: Secondary | ICD-10-CM

## 2020-09-26 DIAGNOSIS — G8929 Other chronic pain: Secondary | ICD-10-CM

## 2020-09-26 DIAGNOSIS — K2100 Gastro-esophageal reflux disease with esophagitis, without bleeding: Secondary | ICD-10-CM

## 2020-09-26 DIAGNOSIS — M545 Low back pain, unspecified: Secondary | ICD-10-CM

## 2020-09-26 DIAGNOSIS — R7309 Other abnormal glucose: Secondary | ICD-10-CM

## 2020-09-26 MED ORDER — DULOXETINE HCL 30 MG PO CPEP: ORAL_CAPSULE | ORAL | 3 refills | Status: DC

## 2020-09-27 LAB — LIPID PANEL
Cholesterol: 208 mg/dL — ABNORMAL HIGH (ref <200)
HDL: 71 mg/dL (ref >=50)
LDL Cholesterol (Calc): 104 mg/dL (calc) — ABNORMAL HIGH
Non-HDL Cholesterol (Calc): 137 mg/dL (calc) — ABNORMAL HIGH (ref <130)
Total CHOL/HDL Ratio: 2.9 (calc) (ref <5.0)
Triglycerides: 221 mg/dL — ABNORMAL HIGH (ref <150)

## 2020-09-27 LAB — INSULIN, RANDOM: Insulin: 49.7 u[IU]/mL — ABNORMAL HIGH

## 2020-09-27 LAB — CBC WITH DIFFERENTIAL/PLATELET
Absolute Monocytes: 343 cells/uL (ref 200–950)
Basophils Absolute: 92 cells/uL (ref 0–200)
Basophils Relative: 1.4 %
Eosinophils Absolute: 178 cells/uL (ref 15–500)
Eosinophils Relative: 2.7 %
HCT: 43.3 % (ref 35.0–45.0)
Hemoglobin: 14.5 g/dL (ref 11.7–15.5)
Lymphs Abs: 2323 cells/uL (ref 850–3900)
MCH: 30 pg (ref 27.0–33.0)
MCHC: 33.5 g/dL (ref 32.0–36.0)
MCV: 89.5 fL (ref 80.0–100.0)
MPV: 9.6 fL (ref 7.5–12.5)
Monocytes Relative: 5.2 %
Neutro Abs: 3663 cells/uL (ref 1500–7800)
Neutrophils Relative %: 55.5 %
Platelets: 320 10*3/uL (ref 140–400)
RBC: 4.84 10*6/uL (ref 3.80–5.10)
RDW: 12 % (ref 11.0–15.0)
Total Lymphocyte: 35.2 %
WBC: 6.6 10*3/uL (ref 3.8–10.8)

## 2020-09-27 LAB — COMPLETE METABOLIC PANEL WITH GFR
AG Ratio: 1.7 (calc) (ref 1.0–2.5)
ALT: 17 U/L (ref 6–29)
AST: 17 U/L (ref 10–35)
Albumin: 4.4 g/dL (ref 3.6–5.1)
Alkaline phosphatase (APISO): 80 U/L (ref 37–153)
BUN/Creatinine Ratio: 10 (calc) (ref 6–22)
BUN: 13 mg/dL (ref 7–25)
CO2: 25 mmol/L (ref 20–32)
Calcium: 9.4 mg/dL (ref 8.6–10.4)
Chloride: 104 mmol/L (ref 98–110)
Creat: 1.24 mg/dL — ABNORMAL HIGH (ref 0.50–1.05)
GFR, Est African American: 57 mL/min/{1.73_m2} — ABNORMAL LOW (ref >=60)
GFR, Est Non African American: 49 mL/min/{1.73_m2} — ABNORMAL LOW (ref >=60)
Globulin: 2.6 g/dL (calc) (ref 1.9–3.7)
Glucose, Bld: 117 mg/dL — ABNORMAL HIGH (ref 65–99)
Potassium: 3.7 mmol/L (ref 3.5–5.3)
Sodium: 139 mmol/L (ref 135–146)
Total Bilirubin: 0.5 mg/dL (ref 0.2–1.2)
Total Protein: 7 g/dL (ref 6.1–8.1)

## 2020-09-27 LAB — HEMOGLOBIN A1C
Hgb A1c MFr Bld: 5.3 % of total Hgb (ref <5.7)
Mean Plasma Glucose: 105 mg/dL
eAG (mmol/L): 5.8 mmol/L

## 2020-09-27 LAB — VITAMIN D 25 HYDROXY (VIT D DEFICIENCY, FRACTURES): Vit D, 25-Hydroxy: 58 ng/mL (ref 30–100)

## 2020-09-27 LAB — MAGNESIUM: Magnesium: 2.2 mg/dL (ref 1.5–2.5)

## 2020-09-27 LAB — TSH: TSH: 4.44 mIU/L

## 2020-09-27 NOTE — Progress Notes (Signed)
============================================================ - Test results slightly outside the reference range are not unusual. If there is anything important, I will review this with you,  otherwise it is considered normal test values.  If you have further questions,  please do not hesitate to contact me at the office or via My Chart.  ============================================================ ============================================================  -  Kidney Functions Stable - Still Stage 3a  (GFR 49)  & OK  ============================================================ ============================================================  -  Total Chol = 208 - a little elevated , but good HDL = 71 - Great   - and Bad LDL Chol = 104 -  sl elevated -   - Continue Crestor   - Diet still very Important   - Recommend a stricter low cholesterol diet   - Cholesterol only comes from animal sources  - ie. meat, dairy, egg yolks  - Eat all the vegetables you want.  - Avoid meat, especially red meat - Beef AND Pork .  - Avoid cheese & dairy - milk & ice cream.     - Cheese is the most concentrated form of trans-fats which  is the worst thing to clog up our arteries.   - Veggie cheese is OK which can be found in the fresh  produce section at [employer] or [employer] or [employer] ============================================================ ============================================================  -  Also, Triglycerides (   221   ) or fats in blood are too high  (goal is less than 150)    - Recommend avoid fried & greasy foods,  sweets / candy,   - Avoid white rice  (brown or wild rice or Quinoa is OK),   - Avoid white potatoes  (sweet potatoes are OK)   - Avoid anything made from white flour  - bagels, doughnuts, rolls, buns, biscuits, white and   wheat breads, pizza crust and traditional  pasta made of white flour & egg white  - (vegetarian pasta or spinach or  wheat pasta is OK).    - Multi-grain bread is OK - like multi-grain flat bread or  sandwich thins.   - Avoid alcohol in excess.   - Exercise is also important. ============================================================ ============================================================  -  A1c - Normal -Great - No Diabetes  ============================================================ ============================================================  -  Vitamin D = 58 - OK   - Vitamin D goal is between 70-100.   - Please make sure that you are taking your Vitamin D as directed.   - It is very important as a natural anti-inflammatory and helping the  immune system protect against viral infections, like the Covid-19    helping hair, skin, and nails, as well as reducing stroke and  heart attack risk.   - It helps your bones and helps with mood.  - It also decreases numerous cancer risks so please  take it as directed.   - Low Vit D is associated with a 200-300% higher risk for  CANCER   and 200-300% higher risk for HEART   ATTACK  &  STROKE.    - It is also associated with higher death rate at younger ages,   autoimmune diseases like Rheumatoid arthritis, Lupus,  Multiple Sclerosis.     - Also many other serious conditions, like depression, Alzheimer's  Dementia, infertility, muscle aches, fatigue, fibromyalgia   - just to name a few. ============================================================ ============================================================  -  All Else - CBC - Kidneys - Electrolytes - Liver - Magnesium & Thyroid    - all  Normal / OK ============================================================ ============================================================

## 2020-10-08 ENCOUNTER — Other Ambulatory Visit: Payer: Self-pay | Admitting: Internal Medicine

## 2020-10-08 MED ORDER — BUPROPION HCL ER (XL) 300 MG PO TB24: ORAL_TABLET | ORAL | 3 refills | Status: DC

## 2020-10-20 ENCOUNTER — Other Ambulatory Visit: Payer: Self-pay | Admitting: Internal Medicine

## 2020-10-20 DIAGNOSIS — R300 Dysuria: Secondary | ICD-10-CM

## 2020-10-20 MED ORDER — NITROFURANTOIN MACROCRYSTAL 50 MG PO CAPS
ORAL_CAPSULE | ORAL | 0 refills | Status: DC
Start: 2020-10-20 — End: 2021-01-31

## 2020-10-25 ENCOUNTER — Other Ambulatory Visit: Payer: BC Managed Care – PPO

## 2020-10-25 ENCOUNTER — Other Ambulatory Visit: Payer: Self-pay

## 2020-10-25 ENCOUNTER — Other Ambulatory Visit: Payer: Self-pay | Admitting: Internal Medicine

## 2020-10-25 DIAGNOSIS — R300 Dysuria: Secondary | ICD-10-CM

## 2020-10-26 LAB — URINALYSIS, ROUTINE W REFLEX MICROSCOPIC
Bilirubin Urine: NEGATIVE
Glucose, UA: NEGATIVE
Hgb urine dipstick: NEGATIVE
Ketones, ur: NEGATIVE
Leukocytes,Ua: NEGATIVE
Nitrite: NEGATIVE
Protein, ur: NEGATIVE
Specific Gravity, Urine: 1.017 (ref 1.001–1.035)
pH: <=5 (ref 5.0–8.0)

## 2020-10-26 LAB — URINE CULTURE
MICRO NUMBER:: 11957187
SPECIMEN QUALITY:: ADEQUATE

## 2020-10-26 NOTE — Progress Notes (Signed)
============================================================ -   Test results slightly outside the reference range are not unusual. If there is anything important, I will review this with you,  otherwise it is considered normal test values.  If you have further questions,  please do not hesitate to contact me at the office or via My Chart.  ============================================================ ============================================================  -  Urine Culture Finalized today & does not show an Infection - So . . . . Marland Kitchen                                                                                              Antibiotic not needed ============================================================ ============================================================

## 2020-11-07 DIAGNOSIS — H6981 Other specified disorders of Eustachian tube, right ear: Secondary | ICD-10-CM | POA: Diagnosis not present

## 2020-11-07 DIAGNOSIS — J342 Deviated nasal septum: Secondary | ICD-10-CM | POA: Diagnosis not present

## 2020-11-07 DIAGNOSIS — J310 Chronic rhinitis: Secondary | ICD-10-CM | POA: Diagnosis not present

## 2020-11-07 DIAGNOSIS — H6521 Chronic serous otitis media, right ear: Secondary | ICD-10-CM | POA: Diagnosis not present

## 2020-11-07 DIAGNOSIS — J343 Hypertrophy of nasal turbinates: Secondary | ICD-10-CM | POA: Diagnosis not present

## 2020-11-13 DIAGNOSIS — N390 Urinary tract infection, site not specified: Secondary | ICD-10-CM | POA: Diagnosis not present

## 2020-11-14 ENCOUNTER — Other Ambulatory Visit: Payer: Self-pay | Admitting: Internal Medicine

## 2020-11-14 DIAGNOSIS — I129 Hypertensive chronic kidney disease with stage 1 through stage 4 chronic kidney disease, or unspecified chronic kidney disease: Secondary | ICD-10-CM | POA: Diagnosis not present

## 2020-11-14 DIAGNOSIS — N390 Urinary tract infection, site not specified: Secondary | ICD-10-CM | POA: Diagnosis not present

## 2020-11-14 DIAGNOSIS — N1832 Chronic kidney disease, stage 3b: Secondary | ICD-10-CM | POA: Diagnosis not present

## 2020-11-14 DIAGNOSIS — E559 Vitamin D deficiency, unspecified: Secondary | ICD-10-CM | POA: Diagnosis not present

## 2020-11-14 DIAGNOSIS — Z87440 Personal history of urinary (tract) infections: Secondary | ICD-10-CM

## 2020-12-12 ENCOUNTER — Ambulatory Visit: Payer: Self-pay | Admitting: Urology

## 2020-12-15 ENCOUNTER — Other Ambulatory Visit: Payer: Self-pay | Admitting: Adult Health

## 2020-12-15 DIAGNOSIS — G8929 Other chronic pain: Secondary | ICD-10-CM

## 2020-12-15 DIAGNOSIS — M545 Low back pain, unspecified: Secondary | ICD-10-CM

## 2020-12-22 ENCOUNTER — Encounter: Payer: Self-pay | Admitting: Gastroenterology

## 2020-12-25 NOTE — Progress Notes (Incomplete)
10/26/19 8:53 AM   Pamela Welch Dec 15, 1965 [id number]  Referring provider: Unk Pinto, MD [address] [place],  [place] [address] No chief complaint on file.   HPI: Pamela Welch is a 55 y.o.       PMH: Past Medical History:  Diagnosis Date   Allergy    SEASONAL   Anxiety    Back injury    Barrett's esophagus    Chest pain, unspecified    normal myoview 2000;  echo 5/10:  mild LVH, EF 60-65%   DDD (degenerative disc disease)    Depression    GERD (gastroesophageal reflux disease)    Hiatal hernia    HSV-1 (herpes simplex virus 1) infection    Hyperlipidemia    IBS (irritable bowel syndrome)    Insomnia    Lumbago    Melanosis coli    Palpitations    h/o symptomatic PVCs   Palpitations    Persistent disorder of initiating or maintaining sleep    Restless legs syndrome (RLS)     Surgical History: Past Surgical History:  Procedure Laterality Date   COLONOSCOPY  2013   ESOPHAGOGASTRODUODENOSCOPY N/A 03/04/2014   Procedure: ESOPHAGOGASTRODUODENOSCOPY (EGD);  Surgeon: Ladene Artist, MD;  Location: [facility];  Service: Endoscopy;  Laterality: N/A;   EYE SURGERY  2001   REFRACTIVE SURGERY     bilateral eyes    TUBES IN EARS     as an adult   UPPER GASTROINTESTINAL ENDOSCOPY      Home Medications:  Allergies as of 12/26/2020       Reactions   Atenolol Other (See Comments)   Made patient feel like a "zombie"   Ambien [zolpidem Tartrate]    Memory issues   Cholestatin    Hot flashes   Mirapex [pramipexole Dihydrochloride]    Hot flashes   Requip [ropinirole Hcl]    Hot flashes        Medication List        Accurate as of December 25, 2020  8:53 AM. If you have any questions, ask your nurse or doctor.          buPROPion 300 MG 24 hr tablet Commonly known as: WELLBUTRIN XL TAKE  1 TABLET  EVERY MORNING  FOR MOOD, FOCUS AND CONCENTRATION   cyclobenzaprine 10 MG tablet Commonly known as: FLEXERIL TAKE 1/2 TO 1 TABLET  3 X /DAY AS NEEDED FOR MUSCLE SPASM   DULoxetine 30 MG capsule Commonly known as: Cymbalta Take  1 capsule  2 x /day  for Chronic Back Pain   fluticasone 50 MCG/ACT nasal spray Commonly known as: FLONASE Place 2 sprays into both nostrils as needed. Reported on 06/27/2015   gabapentin 300 MG capsule Commonly known as: NEURONTIN Take  1 capsule 2 to 3 x /day as needed for Back Pain / Patient knows to take by mouth   Linzess 145 MCG Caps capsule Generic drug: linaclotide Take 145 mcg by mouth daily.   nitrofurantoin 50 MG capsule Commonly known as: Macrodantin Take  1 capsule  4 x /day  - Meals & Bedtime for UTI   pantoprazole 40 MG tablet Commonly known as: PROTONIX TAKE 1 TABLET 2 TIMES DAILY TO PREVENT INDIGESTION AND HEARTBURN   phentermine 37.5 MG tablet Commonly known as: ADIPEX-P Take 1 tablet every morning for Dieting & Weight  Loss   rosuvastatin 20 MG tablet Commonly known as: CRESTOR Take  1 tablet  Daily   for Cholesterol  topiramate 50 MG tablet Commonly known as: TOPAMAX Take  1 tablet  2 x /day  at Suppertime & Bedtime  for Dieting & Weight Loss.  Patient knows to take by mouth   traZODone 150 MG tablet Commonly known as: DESYREL Take  2 tablets  1 hour  before Bedtime as needed for Sleep   valACYclovir 500 MG tablet Commonly known as: VALTREX Take 1 tablet Daily prophylaxis for Cold Sores / Fever Blisters        Allergies:  Allergies  Allergen Reactions   Atenolol Other (See Comments)    Made patient feel like a "zombie"   Ambien [Zolpidem Tartrate]     Memory issues   Cholestatin     Hot flashes   Mirapex [Pramipexole Dihydrochloride]     Hot flashes   Requip [Ropinirole Hcl]     Hot flashes    Family History: Family History  Problem Relation Age of Onset   Heart disease Cousin    Lung cancer Cousin    Hypertension Mother    Osteoporosis Mother    Breast cancer Sister 69       BRCA NEG/BILAT MASTECTOMY/CHEMO X 1 YR    Hyperlipidemia Sister    Ovarian cancer Sister    Colon cancer Maternal Grandmother    Stroke Maternal Grandmother    Osteoporosis Maternal Grandmother    Colon cancer Maternal Grandfather    Multiple myeloma Paternal Grandmother    Hypertension Father    Hyperlipidemia Father    Esophageal cancer Neg Hx    Liver cancer Neg Hx    Pancreatic cancer Neg Hx    Rectal cancer Neg Hx    Stomach cancer Neg Hx     Social History:  reports that she has never smoked. She has never used smokeless tobacco. She reports current alcohol use of about 1.0 standard drink of alcohol per week. She reports that she does not use drugs.   Physical Exam: LMP 12/26/2015   Constitutional:  Alert and oriented, No acute distress. HEENT: Pamela Welch AT, moist mucus membranes.  Trachea midline, no masses. Cardiovascular: No clubbing, cyanosis, or edema. Respiratory: Normal respiratory effort, no increased work of breathing. GI: Abdomen is soft, nontender, nondistended, no abdominal masses GU: No CVA tenderness Lymph: No cervical or inguinal lymphadenopathy. Skin: No rashes, bruises or suspicious lesions. Neurologic: Grossly intact, no focal deficits, moving all 4 extremities. Psychiatric: Normal mood and affect.  Laboratory Data:  Lab Results  Component Value Date   CREATININE 1.24 (H) 09/26/2020    No results found for: PSA  No results found for: TESTOSTERONE  Lab Results  Component Value Date   HGBA1C 5.3 09/26/2020    Urinalysis   Pertinent Imaging: *** No results found for this or any previous visit.  No results found for this or any previous visit.  No results found for this or any previous visit.  No results found for this or any previous visit.  Results for orders placed during the hospital encounter of 03/14/20  US RENAL  Narrative CLINICAL DATA:  Initial evaluation for stage IIIb chronic kidney disease.  EXAM: RENAL / URINARY TRACT ULTRASOUND COMPLETE  COMPARISON:  Prior  ultrasound from 03/05/2014.  FINDINGS: Right Kidney:  Renal measurements: 10.0 x 3.1 x 4.7 cm = volume: 76 mL. Renal echogenicity within normal limits. No nephrolithiasis or hydronephrosis. No focal renal mass.  Left Kidney:  Renal measurements: 11.4 x 4.6 x 4.3 cm = volume: 118 mL. Renal echogenicity within normal limits. No  nephrolithiasis or hydronephrosis. No focal renal mass.  Bladder:  Appears normal for degree of bladder distention.  Other:  None.  IMPRESSION: Normal renal ultrasound. Renal echogenicity within normal limits. No hydronephrosis or other significant finding.   Electronically Signed By: Jeannine Boga M.D. On: 03/14/2020 19:55  No results found for this or any previous visit.  No results found for this or any previous visit.  No results found for this or any previous visit.   Assessment & Plan:     No follow-ups on file.  [facility] [address] [place], [place] [address] [phone]

## 2020-12-26 ENCOUNTER — Ambulatory Visit: Payer: Self-pay | Admitting: Urology

## 2021-01-13 ENCOUNTER — Other Ambulatory Visit: Payer: Self-pay | Admitting: Internal Medicine

## 2021-01-13 DIAGNOSIS — K2100 Gastro-esophageal reflux disease with esophagitis, without bleeding: Secondary | ICD-10-CM

## 2021-01-13 MED ORDER — PANTOPRAZOLE SODIUM 40 MG PO TBEC: DELAYED_RELEASE_TABLET | ORAL | 3 refills | Status: DC

## 2021-01-23 ENCOUNTER — Other Ambulatory Visit: Payer: Self-pay

## 2021-01-23 MED ORDER — TOPIRAMATE 50 MG PO TABS: ORAL_TABLET | ORAL | 1 refills | Status: DC

## 2021-01-30 NOTE — Progress Notes (Addendum)
01/31/21 2:08 PM   [name] 1965-07-23 [id number]  Referring provider:  Unk Pinto, MD [address] [place],  [place] [address] Chief Complaint  Patient presents with   Recurrent UTI     HPI: Pamela Welch is a 55 y.o.female who presents today for further evaluation of recurrent UTIs.   She has stage 3a chronic kidney disease with creatinine level. She was seen by Dr. Lawson Radar at [facility], he suggested a referral to urology for bladder and urinary track symptoms.   Her recent urinalysis on 10/25/2020 was unremarkable and urine culture showed mixed genital flora.   Urinalysis today was negative.   She reports today that she has had 3 UTIs this year. She states she gets a burning sensation at anytime and uses AZO, this is a new symptom that she has started experiencing this year. She denies discharge and hematuria. She sometimes experiences urgency and frequency.   She does have IBS that she states she has had since she was a child.   She reports that she is not sexually active and had been in menopause ore about 5 years. She is interested in STD testing today just to rule out any other infections.    PMH: Past Medical History:  Diagnosis Date   Allergy    SEASONAL   Anxiety    Back injury    Barrett's esophagus    Chest pain, unspecified    normal myoview 2000;  echo 5/10:  mild LVH, EF 60-65%   DDD (degenerative disc disease)    Depression    GERD (gastroesophageal reflux disease)    Hiatal hernia    HSV-1 (herpes simplex virus 1) infection    Hyperlipidemia    IBS (irritable bowel syndrome)    Insomnia    Lumbago    Melanosis coli    Palpitations    h/o symptomatic PVCs   Palpitations    Persistent disorder of initiating or maintaining sleep    Restless legs syndrome (RLS)     Surgical History: Past Surgical History:  Procedure Laterality Date   COLONOSCOPY  2013   ESOPHAGOGASTRODUODENOSCOPY N/A  03/04/2014   Procedure: ESOPHAGOGASTRODUODENOSCOPY (EGD);  Surgeon: Ladene Artist, MD;  Location: [facility];  Service: Endoscopy;  Laterality: N/A;   EYE SURGERY  2001   REFRACTIVE SURGERY     bilateral eyes    TUBES IN EARS     as an adult   UPPER GASTROINTESTINAL ENDOSCOPY      Home Medications:  Allergies as of 01/31/2021       Reactions   Atenolol Other (See Comments)   Made patient feel like a "zombie"   Ambien [zolpidem Tartrate]    Memory issues   Cholestatin    Hot flashes   Mirapex [pramipexole Dihydrochloride]    Hot flashes   Requip [ropinirole Hcl]    Hot flashes        Medication List        Accurate as of January 31, 2021  2:08 PM. If you have any questions, ask your nurse or doctor.          STOP taking these medications    nitrofurantoin 50 MG capsule Commonly known as: Macrodantin Stopped by: Hollice Espy, MD       TAKE these medications    buPROPion 300 MG 24 hr tablet Commonly known as: WELLBUTRIN XL TAKE  1 TABLET  EVERY MORNING  FOR MOOD, FOCUS AND CONCENTRATION  cyclobenzaprine 10 MG tablet Commonly known as: FLEXERIL TAKE 1/2 TO 1 TABLET 3 X /DAY AS NEEDED FOR MUSCLE SPASM   DULoxetine 30 MG capsule Commonly known as: Cymbalta Take  1 capsule  2 x /day  for Chronic Back Pain   fluticasone 50 MCG/ACT nasal spray Commonly known as: FLONASE Place 2 sprays into both nostrils as needed. Reported on 06/27/2015   gabapentin 300 MG capsule Commonly known as: NEURONTIN Take  1 capsule 2 to 3 x /day as needed for Back Pain / Patient knows to take by mouth   Linzess 145 MCG Caps capsule Generic drug: linaclotide Take 145 mcg by mouth daily.   pantoprazole 40 MG tablet Commonly known as: PROTONIX TAKE 1 TABLET 2 TIMES DAILY TO PREVENT INDIGESTION AND HEARTBURN   phentermine 37.5 MG tablet Commonly known as: ADIPEX-P Take 1 tablet every morning for Dieting & Weight  Loss   rosuvastatin 20 MG tablet Commonly known as:  CRESTOR Take  1 tablet  Daily   for Cholesterol   topiramate 50 MG tablet Commonly known as: TOPAMAX Take  1 tablet  2 x /day  at Suppertime & Bedtime  for Dieting & Weight Loss.  Patient knows to take by mouth   traZODone 150 MG tablet Commonly known as: DESYREL Take  2 tablets  1 hour  before Bedtime as needed for Sleep   valACYclovir 500 MG tablet Commonly known as: VALTREX Take 1 tablet Daily prophylaxis for Cold Sores / Fever Blisters        Allergies:  Allergies  Allergen Reactions   Atenolol Other (See Comments)    Made patient feel like a "zombie"   Ambien [Zolpidem Tartrate]     Memory issues   Cholestatin     Hot flashes   Mirapex [Pramipexole Dihydrochloride]     Hot flashes   Requip [Ropinirole Hcl]     Hot flashes    Family History: Family History  Problem Relation Age of Onset   Heart disease Cousin    Lung cancer Cousin    Hypertension Mother    Osteoporosis Mother    Breast cancer Sister 3       BRCA NEG/BILAT MASTECTOMY/CHEMO X 1 YR   Hyperlipidemia Sister    Ovarian cancer Sister    Colon cancer Maternal Grandmother    Stroke Maternal Grandmother    Osteoporosis Maternal Grandmother    Colon cancer Maternal Grandfather    Multiple myeloma Paternal Grandmother    Hypertension Father    Hyperlipidemia Father    Esophageal cancer Neg Hx    Liver cancer Neg Hx    Pancreatic cancer Neg Hx    Rectal cancer Neg Hx    Stomach cancer Neg Hx     Social History:  reports that she has never smoked. She has never used smokeless tobacco. She reports current alcohol use of about 1.0 standard drink per week. She reports that she does not use drugs.   Physical Exam: BP 129/86   Pulse (!) 105   Ht _0  (1.753 m)   Wt 163 lb (73.9 kg)   LMP 12/26/2015   BMI 24.07 kg/m   Constitutional:  Alert and oriented, No acute distress. HEENT: [place] AT, moist mucus membranes.  Trachea midline, no masses. Cardiovascular: No clubbing, cyanosis, or  edema. Respiratory: Normal respiratory effort, no increased work of breathing. Pelvic exam: Chaperoned by Daryel Gerald, CMA.  External genitalia.  Severe atrophic periurethral mucosa with discoloration extending to anterior vaginal introitus  and possibly even hypopigmented scar.  Diffuse vaginal atrophy also appreciated.  No significant pelvic organ prolapse. Skin: No rashes, bruises or suspicious lesions. Neurologic: Grossly intact, no focal deficits, moving all 4 extremities. Psychiatric: Normal mood and affect.  Laboratory Data:  Lab Results  Component Value Date   CREATININE 1.24 (H) 09/26/2020    Lab Results  Component Value Date   HGBA1C 5.3 09/26/2020    Urinalysis - Urinalysis today was negative   Pertinent imaging  Results for orders placed or performed in visit on 01/31/21  Bladder Scan (Post Void Residual) in office  Result Value Ref Range   Scan Result 0      Assessment & Plan:    Vaginal atrophy   - Based on symptoms and absence of bacterial infection, I suspect this is an external problem either related to severe atrophy or possible underlying mucosal issues based on exam today  - Agreed to treat with topical estrogen cream for the first 2 weeks everyday then 3 times a week thereafter.    -If it has not relieved symptoms follow-up with gynecologist. Follow-up already scheduled with gynecologist was advised to push back 6 weeks  -Differential diagnosis does also include conditions such as lichen sclerosis and if she feels improved with topical estrogen, biopsy could be considered by the OB/GYN provider if deemed appropriate  -Urine sent out today for cytology, if symptoms persist consider cystoscopy.   -We discussed whether or not to pursue more extensive evaluation for STIs or atypical bacteria.  The absence of completely negative urine and primarily external symptoms, I do not feel that this is warranted at this time.  She is agreeable and let us know if  she changes her mind.   Follow-up as needed  I,Kailey Littlejohn,acting as a scribe for Hollice Espy, MD.,have documented all relevant documentation on the behalf of Hollice Espy, MD,as directed by  Hollice Espy, MD while in the presence of Hollice Espy, MD.  I have reviewed the above documentation for accuracy and completeness, and I agree with the above.   Hollice Espy, MD    [facility] [address] [place], [place] [address] [phone]  I spent 35 total minutes on the day of the encounter including pre-visit review of the medical record, face-to-face time with the patient, and post visit ordering of labs/imaging/tests.

## 2021-01-31 ENCOUNTER — Ambulatory Visit: Payer: BC Managed Care – PPO | Admitting: Urology

## 2021-01-31 ENCOUNTER — Other Ambulatory Visit: Payer: Self-pay

## 2021-01-31 ENCOUNTER — Encounter: Payer: Self-pay | Admitting: Urology

## 2021-01-31 VITALS — BP 129/86 | HR 105 | Ht 69.0 in | Wt 163.0 lb

## 2021-01-31 DIAGNOSIS — Z87440 Personal history of urinary (tract) infections: Secondary | ICD-10-CM | POA: Diagnosis not present

## 2021-01-31 LAB — BLADDER SCAN AMB NON-IMAGING: Scan Result: 0

## 2021-01-31 NOTE — Patient Instructions (Signed)
Apply Estrace pea size amount for two weeks every night then Monday, Wednesday, Friday evening at bedtime if no resolution ask GYN if concerns for Lichen's sclerosis. Follow up with improvement.

## 2021-02-01 LAB — URINALYSIS, COMPLETE
Bilirubin, UA: NEGATIVE
Glucose, UA: NEGATIVE
Ketones, UA: NEGATIVE
Leukocytes,UA: NEGATIVE
Nitrite, UA: NEGATIVE
Protein,UA: NEGATIVE
RBC, UA: NEGATIVE
Specific Gravity, UA: 1.025 (ref 1.005–1.030)
Urobilinogen, Ur: 0.2 mg/dL (ref 0.2–1.0)
pH, UA: 5 (ref 5.0–7.5)

## 2021-02-01 LAB — MICROSCOPIC EXAMINATION: Bacteria, UA: NONE SEEN

## 2021-02-02 ENCOUNTER — Telehealth: Payer: Self-pay | Admitting: Urology

## 2021-02-02 MED ORDER — ESTRADIOL 0.1 MG/GM VA CREA: TOPICAL_CREAM | VAGINAL | 12 refills | Status: DC

## 2021-02-02 NOTE — Telephone Encounter (Signed)
RX sent-VM left

## 2021-02-02 NOTE — Telephone Encounter (Signed)
Patient was seen in the office by Dr. Erlene Quan on 01/31/21.  Dr. Cherrie Gauze note says: "Agreed to treat with topical estrogen cream for the first 2 weeks everyday then 3 times a week thereafter. "  Prescription was not sent to pharmacy.  Please sent to the [facility] on the [address] of [address].  Contact patient if there are any issues.

## 2021-02-05 ENCOUNTER — Ambulatory Visit: Payer: BC Managed Care – PPO | Admitting: Obstetrics and Gynecology

## 2021-02-05 DIAGNOSIS — Z0289 Encounter for other administrative examinations: Secondary | ICD-10-CM

## 2021-02-05 NOTE — Progress Notes (Deleted)
55 y.o. G0P0 Single White or Caucasian Not Hispanic or Latino female here for annual exam.      Patient's last menstrual period was 12/26/2015.          Sexually active: {yes no:314532}  The current method of family planning is {contraception:315051}.    Exercising: {yes no:314532}  {types:19826} Smoker:  {YES P5382123  Health Maintenance: Pap:  06/03/18 WNL HPV Neg 09/18/20 WNL  History of abnormal Pap:  {YES NO:22349} MMG:  *** BMD:   none  Colonoscopy: 04/15/14 follow up 10 years  TDaP:  04/08/12  Gardasil: na    reports that she has never smoked. She has never used smokeless tobacco. She reports current alcohol use of about 1.0 standard drink per week. She reports that she does not use drugs.  Past Medical History:  Diagnosis Date   Allergy    SEASONAL   Anxiety    Back injury    Barrett's esophagus    Chest pain, unspecified    normal myoview 2000;  echo 5/10:  mild LVH, EF 60-65%   DDD (degenerative disc disease)    Depression    GERD (gastroesophageal reflux disease)    Hiatal hernia    HSV-1 (herpes simplex virus 1) infection    Hyperlipidemia    IBS (irritable bowel syndrome)    Insomnia    Lumbago    Melanosis coli    Palpitations    h/o symptomatic PVCs   Palpitations    Persistent disorder of initiating or maintaining sleep    Restless legs syndrome (RLS)     Past Surgical History:  Procedure Laterality Date   COLONOSCOPY  2013   ESOPHAGOGASTRODUODENOSCOPY N/A 03/04/2014   Procedure: ESOPHAGOGASTRODUODENOSCOPY (EGD);  Surgeon: Ladene Artist, MD;  Location: [facility];  Service: Endoscopy;  Laterality: N/A;   EYE SURGERY  2001   REFRACTIVE SURGERY     bilateral eyes    TUBES IN EARS     as an adult   UPPER GASTROINTESTINAL ENDOSCOPY      Current Outpatient Medications  Medication Sig Dispense Refill   buPROPion (WELLBUTRIN XL) 300 MG 24 hr tablet TAKE  1 TABLET  EVERY MORNING  FOR MOOD, FOCUS AND CONCENTRATION 90 tablet 3   cyclobenzaprine  (FLEXERIL) 10 MG tablet TAKE 1/2 TO 1 TABLET 3 X /DAY AS NEEDED FOR MUSCLE SPASM 90 tablet 0   DULoxetine (CYMBALTA) 30 MG capsule Take  1 capsule  2 x /day  for Chronic Back Pain 180 capsule 3   estradiol (ESTRACE) 0.1 MG/GM vaginal cream Apply a pea size amount two weeks at bedtime then 3 times a week 42.5 g 12   fluticasone (FLONASE) 50 MCG/ACT nasal spray Place 2 sprays into both nostrils as needed. Reported on 06/27/2015     gabapentin (NEURONTIN) 300 MG capsule Take  1 capsule 2 to 3 x /day as needed for Back Pain / Patient knows to take by mouth 270 capsule 3   LINZESS 145 MCG CAPS capsule Take 145 mcg by mouth daily.     pantoprazole (PROTONIX) 40 MG tablet TAKE 1 TABLET 2 TIMES DAILY TO PREVENT INDIGESTION AND HEARTBURN 180 tablet 3   phentermine (ADIPEX-P) 37.5 MG tablet Take 1 tablet every morning for Dieting & Weight  Loss 90 tablet 1   rosuvastatin (CRESTOR) 20 MG tablet Take  1 tablet  Daily   for Cholesterol 90 tablet 3   topiramate (TOPAMAX) 50 MG tablet Take  1 tablet  2 x /day  at [place]  for Dieting & Weight Loss.  Patient knows to take by mouth 180 tablet 1   traZODone (DESYREL) 150 MG tablet Take  2 tablets  1 hour  before Bedtime as needed for Sleep 180 tablet 3   valACYclovir (VALTREX) 500 MG tablet Take 1 tablet Daily prophylaxis for Cold Sores / Fever Blisters 90 tablet 3   No current facility-administered medications for this visit.    Family History  Problem Relation Age of Onset   Heart disease Cousin    Lung cancer Cousin    Hypertension Mother    Osteoporosis Mother    Breast cancer Sister 1       BRCA NEG/BILAT MASTECTOMY/CHEMO X 1 YR   Hyperlipidemia Sister    Ovarian cancer Sister    Colon cancer Maternal Grandmother    Stroke Maternal Grandmother    Osteoporosis Maternal Grandmother    Colon cancer Maternal Grandfather    Multiple myeloma Paternal Grandmother    Hypertension Father    Hyperlipidemia Father    Esophageal cancer Neg Hx     Liver cancer Neg Hx    Pancreatic cancer Neg Hx    Rectal cancer Neg Hx    Stomach cancer Neg Hx     Review of Systems  Exam:   LMP 12/26/2015   Weight change: @WEIGHTCHANGE @ Height:      Ht Readings from Last 3 Encounters:  01/31/21 5' 9"  (1.753 m)  09/26/20 5' 8.75" (1.746 m)  03/15/20 5' 8.75" (1.746 m)    General appearance: alert, cooperative and appears stated age Head: Normocephalic, without obvious abnormality, atraumatic Neck: no adenopathy, supple, symmetrical, trachea midline and thyroid {CHL AMB PHY EX THYROID NORM [phone]::"normal to inspection and palpation"} Lungs: clear to auscultation bilaterally Cardiovascular: regular rate and rhythm Breasts: {Exam; breast:13139::"normal appearance, no masses or tenderness"} Abdomen: soft, non-tender; non distended,  no masses,  no organomegaly Extremities: extremities normal, atraumatic, no cyanosis or edema Skin: Skin color, texture, turgor normal. No rashes or lesions Lymph nodes: Cervical, supraclavicular, and axillary nodes normal. No abnormal inguinal nodes palpated Neurologic: Grossly normal   Pelvic: External genitalia:  no lesions              Urethra:  normal appearing urethra with no masses, tenderness or lesions              Bartholins and Skenes: normal                 Vagina: normal appearing vagina with normal color and discharge, no lesions              Cervix: {CHL AMB PHY EX CERVIX NORM DEFAULT:[phone]::"no lesions"}               Bimanual Exam:  Uterus:  {CHL AMB PHY EX UTERUS NORM DEFAULT:[phone]::"normal size, contour, position, consistency, mobility, non-tender"}              Adnexa: {CHL AMB PHY EX ADNEXA NO MASS DEFAULT:[phone]::"no mass, fullness, tenderness"}               Rectovaginal: Confirms               Anus:  normal sphincter tone, no lesions  *** chaperoned for the exam.  A:  Well Woman with normal exam  P:

## 2021-02-20 ENCOUNTER — Encounter: Payer: Self-pay | Admitting: Obstetrics and Gynecology

## 2021-02-20 ENCOUNTER — Ambulatory Visit: Payer: BC Managed Care – PPO | Admitting: Obstetrics and Gynecology

## 2021-02-20 ENCOUNTER — Other Ambulatory Visit: Payer: Self-pay

## 2021-02-20 VITALS — BP 110/68 | HR 88 | Ht 69.0 in | Wt 159.0 lb

## 2021-02-20 DIAGNOSIS — N952 Postmenopausal atrophic vaginitis: Secondary | ICD-10-CM | POA: Diagnosis not present

## 2021-02-20 DIAGNOSIS — N94819 Vulvodynia, unspecified: Secondary | ICD-10-CM | POA: Diagnosis not present

## 2021-02-20 DIAGNOSIS — N9489 Other specified conditions associated with female genital organs and menstrual cycle: Secondary | ICD-10-CM | POA: Diagnosis not present

## 2021-02-20 LAB — WET PREP FOR TRICH, YEAST, CLUE

## 2021-02-20 MED ORDER — BETAMETHASONE VALERATE 0.1 % EX OINT: 1.0000 "application " | TOPICAL_OINTMENT | Freq: Two times a day (BID) | CUTANEOUS | 0 refills | Status: DC

## 2021-02-20 MED ORDER — LIDOCAINE 5 % EX OINT
1.0000 | TOPICAL_OINTMENT | Freq: Four times a day (QID) | CUTANEOUS | 1 refills | Status: DC | PRN
Start: 2021-02-20 — End: 2021-06-14

## 2021-02-20 NOTE — Progress Notes (Signed)
GYNECOLOGY  VISIT   HPI: 55 y.o.   Single White or Caucasian Not Hispanic or Latino  female   G0P0 with Patient's last menstrual period was 12/26/2015.   here for burning of the vulva. She is having vaginal burning constantly. She saw urology about 3 weeks ago thinking she had a UTI. She was given estrace, and told that if  it did not help with her burning that she could have Lichen Sclerosis.   She has stage 3 kidney disease.    She c/o a several month h/o severe external dysuria.  Severe for the last few weeks. She has baseline pressure and burning externally. She has been taking AZO for 6 weeks and it has helped some. She started premarin cream externally BID 3 weeks ago, it has helped some.  No vaginal discharge.   GYNECOLOGIC HISTORY: Patient's last menstrual period was 12/26/2015. Contraception:PMP  Menopausal hormone therapy: estrace         OB History     Gravida  0   Para      Term      Preterm      AB      Living         SAB      IAB      Ectopic      Multiple      Live Births                 Patient Active Problem List   Diagnosis Date Noted   Chronic constipation 06/09/2018   Overweight (BMI 25.0-29.9) 03/04/2018   CKD (chronic kidney disease) stage 3, GFR 30-59 ml/min 06/30/2015   Memory loss 06/27/2015   Chronic daily headache 06/27/2015   Generalized anxiety disorder 06/27/2015   Labile hypertension 12/22/2014   Vitamin D deficiency 04/27/2014   Abnormal glucose 04/27/2014   Medication management 04/27/2014   IBS (irritable colon syndrome) 04/27/2014   GERD (gastroesophageal reflux disease) 03/03/2014   Barrett's esophagus 03/03/2014   Lumbago    Insomnia    HSV-1 (herpes simplex virus 1) infection    DDD (degenerative disc disease)    Hyperlipidemia    OSA (obstructive sleep apnea) 05/19/2008   RESTLESS LEGS SYNDROME 05/19/2008   HNP (herniated nucleus pulposus), lumbar 05/19/2008    Past Medical History:  Diagnosis Date    Allergy    SEASONAL   Anxiety    Back injury    Barrett's esophagus    Chest pain, unspecified    normal myoview 2000;  echo 5/10:  mild LVH, EF 60-65%   DDD (degenerative disc disease)    Depression    GERD (gastroesophageal reflux disease)    Hiatal hernia    HSV-1 (herpes simplex virus 1) infection    Hyperlipidemia    IBS (irritable bowel syndrome)    Insomnia    Lumbago    Melanosis coli    Palpitations    h/o symptomatic PVCs   Palpitations    Persistent disorder of initiating or maintaining sleep    Restless legs syndrome (RLS)     Past Surgical History:  Procedure Laterality Date   COLONOSCOPY  2013   ESOPHAGOGASTRODUODENOSCOPY N/A 03/04/2014   Procedure: ESOPHAGOGASTRODUODENOSCOPY (EGD);  Surgeon: Ladene Artist, MD;  Location: [facility];  Service: Endoscopy;  Laterality: N/A;   EYE SURGERY  2001   REFRACTIVE SURGERY     bilateral eyes    TUBES IN EARS     as an adult   UPPER GASTROINTESTINAL  ENDOSCOPY      Current Outpatient Medications  Medication Sig Dispense Refill   buPROPion (WELLBUTRIN XL) 300 MG 24 hr tablet TAKE  1 TABLET  EVERY MORNING  FOR MOOD, FOCUS AND CONCENTRATION 90 tablet 3   cyclobenzaprine (FLEXERIL) 10 MG tablet TAKE 1/2 TO 1 TABLET 3 X /DAY AS NEEDED FOR MUSCLE SPASM 90 tablet 0   DULoxetine (CYMBALTA) 30 MG capsule Take  1 capsule  2 x /day  for Chronic Back Pain 180 capsule 3   estradiol (ESTRACE) 0.1 MG/GM vaginal cream Apply a pea size amount two weeks at bedtime then 3 times a week 42.5 g 12   fluticasone (FLONASE) 50 MCG/ACT nasal spray Place 2 sprays into both nostrils as needed. Reported on 06/27/2015     gabapentin (NEURONTIN) 300 MG capsule Take  1 capsule 2 to 3 x /day as needed for Back Pain / Patient knows to take by mouth 270 capsule 3   LINZESS 145 MCG CAPS capsule Take 145 mcg by mouth daily.     pantoprazole (PROTONIX) 40 MG tablet TAKE 1 TABLET 2 TIMES DAILY TO PREVENT INDIGESTION AND HEARTBURN 180 tablet 3    phentermine (ADIPEX-P) 37.5 MG tablet Take 1 tablet every morning for Dieting & Weight  Loss 90 tablet 1   rosuvastatin (CRESTOR) 20 MG tablet Take  1 tablet  Daily   for Cholesterol 90 tablet 3   topiramate (TOPAMAX) 50 MG tablet Take  1 tablet  2 x /day  at Suppertime & Bedtime  for Dieting & Weight Loss.  Patient knows to take by mouth 180 tablet 1   traZODone (DESYREL) 150 MG tablet Take  2 tablets  1 hour  before Bedtime as needed for Sleep 180 tablet 3   valACYclovir (VALTREX) 500 MG tablet Take 1 tablet Daily prophylaxis for Cold Sores / Fever Blisters 90 tablet 3   No current facility-administered medications for this visit.     ALLERGIES: Atenolol, Ambien [zolpidem tartrate], Cholestatin, Mirapex [pramipexole dihydrochloride], and Requip [ropinirole hcl]  Family History  Problem Relation Age of Onset   Heart disease Cousin    Lung cancer Cousin    Hypertension Mother    Osteoporosis Mother    Breast cancer Sister 54       BRCA NEG/BILAT MASTECTOMY/CHEMO X 1 YR   Hyperlipidemia Sister    Ovarian cancer Sister    Colon cancer Maternal Grandmother    Stroke Maternal Grandmother    Osteoporosis Maternal Grandmother    Colon cancer Maternal Grandfather    Multiple myeloma Paternal Grandmother    Hypertension Father    Hyperlipidemia Father    Esophageal cancer Neg Hx    Liver cancer Neg Hx    Pancreatic cancer Neg Hx    Rectal cancer Neg Hx    Stomach cancer Neg Hx   Her sister had Breast cancer Dx at 51.   Social History   Socioeconomic History   Marital status: Single    Spouse name: Not on file   Number of children: 2   Years of education: Not on file   Highest education level: Not on file  Occupational History   Occupation: cpa    Employer: [employer]   Tobacco Use   Smoking status: Never   Smokeless tobacco: Never  Vaping Use   Vaping Use: Never used  Substance and Sexual Activity   Alcohol use: Yes    Alcohol/week: 1.0 standard drink    Types: 1  Standard drinks or  equivalent per week    Comment: Social   Drug use: No   Sexual activity: Yes    Birth control/protection: None, Post-menopausal  Other Topics Concern   Not on file  Social History Narrative   Single.  Lives alone.  2 teenage children.   Social Determinants of Health   Financial Resource Strain: Not on file  Food Insecurity: Not on file  Transportation Needs: Not on file  Physical Activity: Not on file  Stress: Not on file  Social Connections: Not on file  Intimate Partner Violence: Not on file    Review of Systems  Genitourinary:        Vaginal burning   All other systems reviewed and are negative.  PHYSICAL EXAMINATION:    LMP 12/26/2015     General appearance: alert, cooperative and appears stated age  Pelvic: External genitalia:  no lesions, no whitening, no fissures, no plaques. Tender at the vestibule.               Urethra:  normal appearing urethra with no masses, tenderness or lesions. Atrophic mucosa surrounding the urethra.               Bartholins and Skenes: normal                 Vagina: mildly atrophic appearing vagina with normal color and discharge, no lesions              Cervix: no lesions              Bimanual Exam:  Uterus:  normal size, contour, position, consistency, mobility, non-tender              Adnexa: no mass, fullness, tenderness               Chaperone was present for exam.  1. Vulvar burning -sureswab vaginitis panel sent - betamethasone valerate ointment (VALISONE) 0.1 %; Apply 1 application topically 2 (two) times daily.  Dispense: 30 g; Refill: 0 -F/U in 2 weeks  2. Vaginal atrophy Continue with premarin cream, change to 2-3 x a week  3. Vulvodynia - lidocaine (XYLOCAINE) 5 % ointment; Apply 1 application topically 4 (four) times daily as needed.  Dispense: 30 g; Refill: 1 -Vulvar skin care reviewed -Information on vulvodynia given -F/U in 2 weeks -consider compounded medication, oral medication and PT  Over  30 minutes in total patient care.

## 2021-02-22 LAB — SURESWAB® ADVANCED BACTERIAL VAGINOSIS (BV), TMA: SURESWAB(R) ADV BACTERIAL VAGINOSIS(BV),TMA: NEGATIVE

## 2021-03-19 ENCOUNTER — Encounter: Payer: Self-pay | Admitting: Obstetrics and Gynecology

## 2021-03-19 ENCOUNTER — Ambulatory Visit: Payer: BC Managed Care – PPO | Admitting: Obstetrics and Gynecology

## 2021-03-19 ENCOUNTER — Other Ambulatory Visit: Payer: Self-pay | Admitting: Internal Medicine

## 2021-03-19 ENCOUNTER — Other Ambulatory Visit: Payer: Self-pay

## 2021-03-19 VITALS — BP 122/64 | HR 97 | Ht 69.0 in | Wt 165.0 lb

## 2021-03-19 DIAGNOSIS — N94819 Vulvodynia, unspecified: Secondary | ICD-10-CM

## 2021-03-19 DIAGNOSIS — N952 Postmenopausal atrophic vaginitis: Secondary | ICD-10-CM | POA: Diagnosis not present

## 2021-03-19 MED ORDER — GABAPENTIN 100 MG PO CAPS
ORAL_CAPSULE | ORAL | 1 refills | Status: DC
Start: 2021-03-19 — End: 2021-04-23

## 2021-03-19 NOTE — Patient Instructions (Addendum)
Change to using 1 gram of estrace cream vaginally 2 x a week.

## 2021-03-19 NOTE — Progress Notes (Signed)
GYNECOLOGY  VISIT   HPI: 55 y.o.   Single White or Caucasian Not Hispanic or Latino  female   G0P0 with Patient's last menstrual period was 12/26/2015.   here for follow up for vulvar burning.   She states her vulva is much better. She says that she has vaginal pressure. She is still taking Azo and usin lidocaine internally to help with the vaginal pressure. She states that she has been taking the Azo and using the lidocaine and the burning fluctuates.   The patient was seen on 02/20/21 with vulvar burning. Vaginitis panel was negative for infection. She was already on vaginal estrogen. 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 She was given lidocaine ointment, vulvar skin care was reviewed.  She is taking AZO 3-4 x a day. Using lidocaine 3-4 x a day. Once it flares she is so irritated.  She feels like she has "held her bladder for too long", like she "is having sex without lubrication". The pain is all localized to her vulva, not her bladder. Voiding normally. No vaginal bulge.   She has scripts for gabapentin and Cymbalta, but isn't taking either currently. She only takes the gabapentin on occasion.   She was using vaginal estrogen, recently stopped it. Only had a sample for premarin cream.   GYNECOLOGIC HISTORY: Patient's last menstrual period was 12/26/2015. Contraception:pmp  Menopausal hormone therapy: yes premarin cream         OB History     Gravida  0   Para      Term      Preterm      AB      Living         SAB      IAB      Ectopic      Multiple      Live Births                 Patient Active Problem List   Diagnosis Date Noted   Chronic constipation 06/09/2018   Overweight (BMI 25.0-29.9) 03/04/2018   CKD (chronic kidney disease) stage 3, GFR 30-59 ml/min 06/30/2015   Memory loss 06/27/2015   Chronic daily headache 06/27/2015   Generalized anxiety disorder 06/27/2015   Labile hypertension 12/22/2014   Vitamin D deficiency 04/27/2014   Abnormal  glucose 04/27/2014   Medication management 04/27/2014   IBS (irritable colon syndrome) 04/27/2014   GERD (gastroesophageal reflux disease) 03/03/2014   Barrett's esophagus 03/03/2014   Lumbago    Insomnia    HSV-1 (herpes simplex virus 1) infection    DDD (degenerative disc disease)    Hyperlipidemia    OSA (obstructive sleep apnea) 05/19/2008   RESTLESS LEGS SYNDROME 05/19/2008   HNP (herniated nucleus pulposus), lumbar 05/19/2008    Past Medical History:  Diagnosis Date   Allergy    SEASONAL   Anxiety    Back injury    Barrett's esophagus    Chest pain, unspecified    normal myoview 2000;  echo 5/10:  mild LVH, EF 60-65%   DDD (degenerative disc disease)    Depression    GERD (gastroesophageal reflux disease)    Hiatal hernia    HSV-1 (herpes simplex virus 1) infection    Hyperlipidemia    IBS (irritable bowel syndrome)    Insomnia    Lumbago    Melanosis coli    Palpitations    h/o symptomatic PVCs   Palpitations    Persistent disorder of initiating or maintaining sleep  Restless legs syndrome (RLS)     Past Surgical History:  Procedure Laterality Date   COLONOSCOPY  2013   ESOPHAGOGASTRODUODENOSCOPY N/A 03/04/2014   Procedure: ESOPHAGOGASTRODUODENOSCOPY (EGD);  Surgeon: Ladene Artist, MD;  Location: [facility];  Service: Endoscopy;  Laterality: N/A;   EYE SURGERY  2001   REFRACTIVE SURGERY     bilateral eyes    TUBES IN EARS     as an adult   UPPER GASTROINTESTINAL ENDOSCOPY      Current Outpatient Medications  Medication Sig Dispense Refill   betamethasone valerate ointment (VALISONE) 0.1 % Apply 1 application topically 2 (two) times daily. 30 g 0   buPROPion (WELLBUTRIN XL) 300 MG 24 hr tablet TAKE  1 TABLET  EVERY MORNING  FOR MOOD, FOCUS AND CONCENTRATION 90 tablet 3   cyclobenzaprine (FLEXERIL) 10 MG tablet TAKE 1/2 TO 1 TABLET 3 X /DAY AS NEEDED FOR MUSCLE SPASM 90 tablet 0   DULoxetine (CYMBALTA) 30 MG capsule Take  1 capsule  2 x /day  for  Chronic Back Pain 180 capsule 3   estradiol (ESTRACE) 0.1 MG/GM vaginal cream Apply a pea size amount two weeks at bedtime then 3 times a week 42.5 g 12   fluticasone (FLONASE) 50 MCG/ACT nasal spray Place 2 sprays into both nostrils as needed. Reported on 06/27/2015     gabapentin (NEURONTIN) 300 MG capsule Take  1 capsule 2 to 3 x /day as needed for Back Pain / Patient knows to take by mouth 270 capsule 3   lidocaine (XYLOCAINE) 5 % ointment Apply 1 application topically 4 (four) times daily as needed. 30 g 1   LINZESS 145 MCG CAPS capsule Take 145 mcg by mouth daily.     pantoprazole (PROTONIX) 40 MG tablet TAKE 1 TABLET 2 TIMES DAILY TO PREVENT INDIGESTION AND HEARTBURN 180 tablet 3   phentermine (ADIPEX-P) 37.5 MG tablet TAKE 1 TABLET EVERY MORNING FOR DIETING & WEIGHT LOSS 30 tablet 0   rosuvastatin (CRESTOR) 20 MG tablet Take  1 tablet  Daily   for Cholesterol 90 tablet 3   topiramate (TOPAMAX) 50 MG tablet Take  1 tablet  2 x /day  at Suppertime & Bedtime  for Dieting & Weight Loss.  Patient knows to take by mouth 180 tablet 1   traZODone (DESYREL) 150 MG tablet Take  2 tablets  1 hour  before Bedtime as needed for Sleep 180 tablet 3   valACYclovir (VALTREX) 500 MG tablet Take 1 tablet Daily prophylaxis for Cold Sores / Fever Blisters 90 tablet 3   No current facility-administered medications for this visit.     ALLERGIES: Atenolol, Ambien [zolpidem tartrate], Cholestatin, Mirapex [pramipexole dihydrochloride], and Requip [ropinirole hcl]  Family History  Problem Relation Age of Onset   Heart disease Cousin    Lung cancer Cousin    Hypertension Mother    Osteoporosis Mother    Breast cancer Sister 7       BRCA NEG/BILAT MASTECTOMY/CHEMO X 1 YR   Hyperlipidemia Sister    Ovarian cancer Sister    Colon cancer Maternal Grandmother    Stroke Maternal Grandmother    Osteoporosis Maternal Grandmother    Colon cancer Maternal Grandfather    Multiple myeloma Paternal Grandmother     Hypertension Father    Hyperlipidemia Father    Esophageal cancer Neg Hx    Liver cancer Neg Hx    Pancreatic cancer Neg Hx    Rectal cancer Neg Hx    Stomach  cancer Neg Hx     Social History   Socioeconomic History   Marital status: Single    Spouse name: Not on file   Number of children: 2   Years of education: Not on file   Highest education level: Not on file  Occupational History   Occupation: cpa    Employer: [employer]   Tobacco Use   Smoking status: Never   Smokeless tobacco: Never  Vaping Use   Vaping Use: Never used  Substance and Sexual Activity   Alcohol use: Yes    Alcohol/week: 1.0 standard drink    Types: 1 Standard drinks or equivalent per week    Comment: Social   Drug use: No   Sexual activity: Yes    Birth control/protection: None, Post-menopausal  Other Topics Concern   Not on file  Social History Narrative   Single.  Lives alone.  2 teenage children.   Social Determinants of Health   Financial Resource Strain: Not on file  Food Insecurity: Not on file  Transportation Needs: Not on file  Physical Activity: Not on file  Stress: Not on file  Social Connections: Not on file  Intimate Partner Violence: Not on file    Review of Systems  All other systems reviewed and are negative.  PHYSICAL EXAMINATION:    LMP 12/26/2015     General appearance: alert, cooperative and appears stated age  Pelvic: External genitalia:  no lesions, atrophic. Not tender at the introitus, but she used lidocaine ointment a few hours ago.              Urethra:  normal appearing urethra with no masses, tenderness or lesions              Bartholins and Skenes: normal                 Vagina: atrophic at the introitus, no speculum exam done              Cervix: no cervical motion tenderness              Bimanual Exam:  Uterus:  normal size, contour, position, consistency, mobility, non-tender              Adnexa: no mass, fullness, tenderness              Bladder:  not tender  Chaperone was present for exam.  1. Vulvodynia She is aware of vulvar skin care, vaginal moisturizer. I recommended she restart vaginal estrogen because atrophy could contribute to her burning.  - Ambulatory referral to Physical Therapy - gabapentin (NEURONTIN) 100 MG capsule; Take one tablet po qhs, after 3 days if tolerating increase to 1 tablet po BID, after another 3 days try increasing to 1 tablet po TID  Dispense: 90 capsule; Refill: 1 -She has lidocaine for prn use -We discussed the option of Cymbalta and topical products  2. Vaginal atrophy She will restart the estrace cream. Recommended she use 1 gram 2 x a week at hs  Over 25 minutes in total patient care.

## 2021-03-21 ENCOUNTER — Other Ambulatory Visit: Payer: Self-pay

## 2021-03-21 ENCOUNTER — Ambulatory Visit (INDEPENDENT_AMBULATORY_CARE_PROVIDER_SITE_OTHER): Payer: BC Managed Care – PPO | Admitting: Internal Medicine

## 2021-03-21 ENCOUNTER — Encounter: Payer: Self-pay | Admitting: Internal Medicine

## 2021-03-21 ENCOUNTER — Ambulatory Visit: Payer: BC Managed Care – PPO | Admitting: Obstetrics and Gynecology

## 2021-03-21 VITALS — BP 128/80 | HR 90 | Temp 97.6°F | Resp 16 | Ht 69.0 in | Wt 164.2 lb

## 2021-03-21 DIAGNOSIS — K2100 Gastro-esophageal reflux disease with esophagitis, without bleeding: Secondary | ICD-10-CM

## 2021-03-21 DIAGNOSIS — Z1389 Encounter for screening for other disorder: Secondary | ICD-10-CM

## 2021-03-21 DIAGNOSIS — Z13220 Encounter for screening for lipoid disorders: Secondary | ICD-10-CM

## 2021-03-21 DIAGNOSIS — Z0001 Encounter for general adult medical examination with abnormal findings: Secondary | ICD-10-CM

## 2021-03-21 DIAGNOSIS — Z1211 Encounter for screening for malignant neoplasm of colon: Secondary | ICD-10-CM

## 2021-03-21 DIAGNOSIS — Z136 Encounter for screening for cardiovascular disorders: Secondary | ICD-10-CM | POA: Diagnosis not present

## 2021-03-21 DIAGNOSIS — R0989 Other specified symptoms and signs involving the circulatory and respiratory systems: Secondary | ICD-10-CM | POA: Diagnosis not present

## 2021-03-21 DIAGNOSIS — Z130 Encounter for screening for diseases of the blood and blood-forming organs and certain disorders involving the immune mechanism: Secondary | ICD-10-CM

## 2021-03-21 DIAGNOSIS — Z23 Encounter for immunization: Secondary | ICD-10-CM | POA: Diagnosis not present

## 2021-03-21 DIAGNOSIS — E782 Mixed hyperlipidemia: Secondary | ICD-10-CM

## 2021-03-21 DIAGNOSIS — N1832 Chronic kidney disease, stage 3b: Secondary | ICD-10-CM

## 2021-03-21 DIAGNOSIS — Z8249 Family history of ischemic heart disease and other diseases of the circulatory system: Secondary | ICD-10-CM

## 2021-03-21 DIAGNOSIS — G8929 Other chronic pain: Secondary | ICD-10-CM

## 2021-03-21 DIAGNOSIS — E559 Vitamin D deficiency, unspecified: Secondary | ICD-10-CM

## 2021-03-21 DIAGNOSIS — Z79899 Other long term (current) drug therapy: Secondary | ICD-10-CM | POA: Diagnosis not present

## 2021-03-21 DIAGNOSIS — Z111 Encounter for screening for respiratory tuberculosis: Secondary | ICD-10-CM

## 2021-03-21 DIAGNOSIS — Z131 Encounter for screening for diabetes mellitus: Secondary | ICD-10-CM

## 2021-03-21 DIAGNOSIS — R5383 Other fatigue: Secondary | ICD-10-CM

## 2021-03-21 DIAGNOSIS — R7309 Other abnormal glucose: Secondary | ICD-10-CM

## 2021-03-21 DIAGNOSIS — Z0000 Encounter for general adult medical examination without abnormal findings: Secondary | ICD-10-CM

## 2021-03-21 DIAGNOSIS — Z87440 Personal history of urinary (tract) infections: Secondary | ICD-10-CM

## 2021-03-21 DIAGNOSIS — K581 Irritable bowel syndrome with constipation: Secondary | ICD-10-CM

## 2021-03-21 MED ORDER — CYCLOBENZAPRINE HCL 10 MG PO TABS: ORAL_TABLET | ORAL | 3 refills | Status: DC

## 2021-03-21 MED ORDER — DULOXETINE HCL 30 MG PO CPEP: ORAL_CAPSULE | ORAL | 3 refills | Status: DC

## 2021-03-21 MED ORDER — DEXAMETHASONE 4 MG PO TABS: ORAL_TABLET | ORAL | 0 refills | Status: DC

## 2021-03-21 NOTE — Progress Notes (Signed)
Annual Screening/Preventative Visit & Comprehensive Evaluation &  Examination  Future Appointments  Date Time Provider [place]  03/21/2021  2:00 PM Unk Pinto, MD [facility] None  05/02/2021 10:45 AM Salvadore Dom, MD [facility] None  06/14/2021  2:30 PM Salvadore Dom, MD [facility] None  03/21/2022  2:00 PM Unk Pinto, MD [facility] None        This very nice 55 y.o. single WF  presents for a Screening /Preventative Visit & comprehensive evaluation and management of multiple medical co-morbidities.  Patient has been followed for labile HTN, HLD, IBS-C, GERD (hx Barrett's), Prediabetes  and Vitamin D Deficiency.        HTN predates circa 2004.   Patient's BP has been controlled at home and patient denies any cardiac symptoms as chest pain, palpitations, shortness of breath, dizziness or ankle swelling. Today's BP\  is at goal - 128/80 .  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        Patient's hyperlipidemia is controlled with diet & Rosuvastatin. Patient denies myalgias or other medication SE's. Last lipids were not at goal :  Lab Results  Component Value Date   CHOL 208 (H) 09/26/2020   HDL 71 09/26/2020   LDLCALC 104 (H) 09/26/2020   TRIG 221 (H) 09/26/2020   CHOLHDL 2.9 09/26/2020         Patient his followed proactively for glucose intolerance and patient denies reactive hypoglycemic symptoms, visual blurring, diabetic polys or paresthesias. Last A1c was normal & at goal :  Lab Results  Component Value Date   HGBA1C 5.3 09/26/2020         Finally, patient has history of Vitamin D Deficiency a ("36" /2012)  and last Vitamin D was at goal :  Lab Results  Component Value Date   VD25OH 58 09/26/2020     Current Outpatient Medications on File Prior to Visit  Medication Sig   betamethasone valerate  0.1 % oint Apply 1 application topically 2 (two) times daily.   buPROPion-XL 300 MG 2 TAKE  1 TABLET  EVERY  MORNING     cyclobenzaprine 10 MG tablet TAKE 1/2 TO 1 TABLET 3 X /DAY AS NEEDED    DULoxetine 30 MG capsule Take  1 capsule  2 x /day  for Chronic Back Pain   estradiol 0.1 MG/GM vaginal crm Apply a pea size amount two weeks at bedtime then 3 times a week   FLONASE  nasal spray Place 2 sprays into both nostrils as needed. Reported on 06/27/2015   gabapentin 300 MG capsule Take  1 capsule 2 to 3 x /day as needed for Back Pain    lidocaine 5 % ointment Apply 1 application topically 4 (four) times daily as needed.   LINZESS 145 MCG CAPS  Take 145 mcg by mouth daily.   pantoprazole 40 MG tablet TAKE 1 TABLET 2 TIMES DAILY TO PREVENT INDIGESTION AND HEARTBURN   phentermine  37.5 MG tablet TAKE 1 TABLET EVERY MORNING    rosuvastatin  20 MG tablet Take  1 tablet  Daily   for Cholesterol   topiramate 50 MG tablet Take  1 tablet  2 x /day  at Suppertime & Bedtime     traZODone 150 MG tablet Take  2 tablets  1 hour  before Bedtime as needed for Sleep   valACYclovir 500 MG tablet Take 1 tablet Daily prophylaxis for Cold Sores / Fever Blisters  Allergies  Allergen Reactions   Atenolol Other (See Comments)    Made patient feel like a "zombie"   Ambien [Zolpidem Tartrate]     Memory issues   Cholestatin     Hot flashes   Mirapex [Pramipexole Dihydrochloride]     Hot flashes   Requip [Ropinirole Hcl]     Hot flashes     Past Medical History:  Diagnosis Date   Allergy    SEASONAL   Anxiety    Back injury    Barrett's esophagus    Chest pain, unspecified    normal myoview 2000;  echo 5/10:  mild LVH, EF 60-65%   DDD (degenerative disc disease)    Depression    GERD (gastroesophageal reflux disease)    Hiatal hernia    HSV-1 (herpes simplex virus 1) infection    Hyperlipidemia    IBS (irritable bowel syndrome)    Insomnia    Lumbago    Melanosis coli    Palpitations    h/o symptomatic PVCs   Palpitations    Persistent disorder of initiating or maintaining sleep    Restless  legs syndrome (RLS)      Health Maintenance  Topic Date Due   Pneumococcal Vaccine 67-14 Years old (1 - PCV) 10[birth year]27/1970   COVID-19 Vaccine (4 - Booster for Pfizer series) 06/01/2020   Zoster Vaccines- Shingrix (2 of 2) 06/13/2020   INFLUENZA VACCINE  12/25/2020   MAMMOGRAM  04/05/2021   PAP SMEAR-Modifier  06/03/2021   TETANUS/TDAP  04/08/2022   COLONOSCOPY  04/15/2024   Hepatitis C Screening  Completed   HIV Screening  Completed   HPV VACCINES  Aged Out     Immunization History  Administered Date(s) Administered   Influenza Inj Mdck Quad With Preservative 04/15/2019   Influenza Split 04/15/2013, 04/27/2014   Influenza, Seasonal, Injecte, Preservative Fre 04/23/2016   Influenza,inj,Quad PF,6+ Mos 04/06/2020   Influenza-Unspecified 03/27/2012   PFIZER(Purple Top)SARS-COV-2 Vaccination 08/13/2019, 09/03/2019, 04/06/2020   PPD Test 04/15/2013, 04/27/2014, 06/30/2015, 09/18/2016, 11/14/2017, 12/24/2018, 03/15/2020   Pneumococcal-Unspecified 05/27/2009   Td 05/27/2001   Tdap 04/08/2012   Zoster Recombinat (Shingrix) 04/18/2020     Last Colon - 04/15/2014 - Dr Fuller Plan - Negative - Recc 10 yr f/u Last EGD - 12/10/2017 - Dr Fuller Plan - Negative Bx's - Recc f/u 3 yra - Over due - patient aware   Last MGM - 04/05/2020  7 cheduled for November    Past Surgical History:  Procedure Laterality Date   COLONOSCOPY  2013   ESOPHAGOGASTRODUODENOSCOPY N/A 03/04/2014   Procedure: ESOPHAGOGASTRODUODENOSCOPY (EGD);  Surgeon: Ladene Artist, MD;  Location: [facility];  Service: Endoscopy;  Laterality: N/A;   EYE SURGERY  2001   REFRACTIVE SURGERY     bilateral eyes    TUBES IN EARS     as an adult   UPPER GASTROINTESTINAL ENDOSCOPY       Family History  Problem Relation Age of Onset   Heart disease Cousin    Lung cancer Cousin    Hypertension Mother    Osteoporosis Mother    Breast cancer Sister 33       BRCA NEG/BILAT MASTECTOMY/CHEMO X 1 YR   Hyperlipidemia Sister    Ovarian  cancer Sister    Colon cancer Maternal Grandmother    Stroke Maternal Grandmother    Osteoporosis Maternal Grandmother    Colon cancer Maternal Grandfather    Multiple myeloma Paternal Grandmother    Hypertension Father    Hyperlipidemia Father  Esophageal cancer Neg Hx    Liver cancer Neg Hx    Pancreatic cancer Neg Hx    Rectal cancer Neg Hx    Stomach cancer Neg Hx      Social History   Tobacco Use   Smoking status: Never   Smokeless tobacco: Never  Vaping Use   Vaping Use: Never used  Substance Use Topics   Alcohol use: Yes    Alcohol/week: 1.0 standard drink    Types: 1 Standard drinks or equivalent per week    Comment: Social   Drug use: No      ROS Constitutional: Denies fever, chills, weight loss/gain, headaches, insomnia,  night sweats, and change in appetite. Does c/o fatigue. Eyes: Denies redness, blurred vision, diplopia, discharge, itchy, watery eyes.  ENT: Denies discharge, congestion, post nasal drip, epistaxis, sore throat, earache, hearing loss, dental pain, Tinnitus, Vertigo, Sinus pain, snoring.  Cardio: Denies chest pain, palpitations, irregular heartbeat, syncope, dyspnea, diaphoresis, orthopnea, PND, claudication, edema Respiratory: denies cough, dyspnea, DOE, pleurisy, hoarseness, laryngitis, wheezing.  Gastrointestinal: Denies dysphagia, heartburn, reflux, water brash, pain, cramps, nausea, vomiting, bloating, diarrhea, constipation, hematemesis, melena, hematochezia, jaundice, hemorrhoids Genitourinary: Denies dysuria, frequency, urgency, nocturia, hesitancy, discharge, hematuria, flank pain Breast: Breast lumps, nipple discharge, bleeding.  Musculoskeletal: Denies arthralgia, myalgia, stiffness, Jt. Swelling, pain, limp, and strain/sprain. Denies falls. Skin: Denies puritis, rash, hives, warts, acne, eczema, changing in skin lesion Neuro: No weakness, tremor, incoordination, spasms, paresthesia, pain Psychiatric: Denies confusion, memory loss,  sensory loss. Denies Depression. Endocrine: Denies change in weight, skin, hair change, nocturia, and paresthesia, diabetic polys, visual blurring, hyper / hypo glycemic episodes.  Heme/Lymph: No excessive bleeding, bruising, enlarged lymph nodes.  Physical Exam  BP 128/80   Pulse 90   Temp 97.6 F (36.4 C)   Resp 16   Ht 5' 9"  (1.753 m)   Wt 164 lb 3.2 oz (74.5 kg)   LMP 12/26/2015   SpO2 95%   BMI 24.25 kg/m   General Appearance: Well nourished, well groomed and in no apparent distress.  Eyes: PERRLA, EOMs, conjunctiva no swelling or erythema, normal fundi and vessels. Sinuses: No frontal/maxillary tenderness ENT/Mouth: EACs patent / TMs  nl. Nares clear without erythema, swelling, mucoid exudates. Oral hygiene is good. No erythema, swelling, or exudate. Tongue normal, non-obstructing. Tonsils not swollen or erythematous. Hearing normal.  Neck: Supple, thyroid not palpable. No bruits, nodes or JVD. Respiratory: Respiratory effort normal.  BS equal and clear bilateral without rales, rhonci, wheezing or stridor. Cardio: Heart sounds are normal with regular rate and rhythm and no murmurs, rubs or gallops. Peripheral pulses are normal and equal bilaterally without edema. No aortic or femoral bruits. Chest: symmetric with normal excursions and percussion. Breasts: Symmetric, without lumps, nipple discharge, retractions, or fibrocystic changes.  Abdomen: Flat, soft with bowel sounds active. Nontender, no guarding, rebound, hernias, masses, or organomegaly.  Lymphatics: Non tender without lymphadenopathy.  Genitourinary:  Musculoskeletal: Full ROM all peripheral extremities, joint stability, 5/5 strength, and normal gait. Skin: Warm and dry without rashes, lesions, cyanosis, clubbing or  ecchymosis.  Neuro: Cranial nerves intact, reflexes equal bilaterally. Normal muscle tone, no cerebellar symptoms. Sensation intact.  Pysch: Alert and oriented X 3, normal affect, Insight and Judgment  appropriate.    Assessment and Plan  1. Annual Preventative Screening Examination   2. Labile hypertension  - EKG 12-Lead - [place], RETROPERITNL ABD,  LTD - Urinalysis, Routine w reflex microscopic - Microalbumin / creatinine urine ratio - CBC with Differential/Platelet - COMPLETE  METABOLIC PANEL WITH GFR - Magnesium - TSH  3. Hyperlipidemia, mixed  - EKG 12-Lead - [place], RETROPERITNL ABD,  LTD - Lipid panel - TSH  4. Abnormal glucose  - EKG 12-Lead - [place], RETROPERITNL ABD,  LTD - Hemoglobin A1c - Insulin, random  5. Vitamin D deficiency  - VITAMIN D 25 Hydroxy   6. Stage 3b chronic kidney disease (HCC)  - Urinalysis, Routine w reflex microscopic - Microalbumin / creatinine urine ratio - COMPLETE METABOLIC PANEL WITH GFR  7. Gastroesophageal reflux disease  - CBC with Differential/Platelet  8. History of recurrent UTIs  - Urinalysis, Routine w reflex microscopic - Urine Culture  9. Irritable bowel syndrome with constipation   10. Screening examination for pulmonary tuberculosis  - TB Skin Test  11. Screening for colorectal cancer  - POC Hemoccult Bld/Stl   12. Screening for ischemic heart disease - EKG 12-Lead  13. FHx: heart disease  - EKG 12-Lead - [place], RETROPERITNL ABD,  LTD  14. Screening for AAA (aortic abdominal aneurysm)  - [place], RETROPERITNL ABD,  LTD  15. Fatigue, unspecified type - Iron, Total/Total Iron Binding Cap - Vitamin B12 - CBC with Differential/Platelet - TSH  16. Medication management  - Urinalysis, Routine w reflex microscopic - Microalbumin / creatinine urine ratio - CBC with Differential/Platelet - COMPLETE METABOLIC PANEL WITH GFR - Magnesium - Lipid panel - TSH - Hemoglobin A1c - Insulin, random - VITAMIN D 25 Hydroxy   17. Need for immunization against influenza  - Flu Vaccine QUAD 6+ mos PF IM (Fluarix Quad PF)   18.   Chronic midline low back pain without sciatica  - DULoxetine (CYMBALTA) 30 MG  capsule;  Take  1 capsule  2 x /day  for Chronic Back Pain   Dispense: 180 capsule; Refill: 3          Patient was counseled in prudent diet to achieve/maintain BMI less than 25 for weight control, BP monitoring, regular exercise and medications. Discussed med's effects and SE's. Screening labs and tests as requested with regular follow-up as recommended. Over 40 minutes of exam, counseling, chart review and high complex critical decision making was performed.   Kirtland Bouchard, MD

## 2021-03-21 NOTE — Patient Instructions (Signed)
Due to recent changes in healthcare laws, you may see the results of your imaging and laboratory studies on MyChart before your provider has had a chance to review them.  We understand that in some cases there may be results that are confusing or concerning to you. Not all laboratory results come back in the same time frame and the provider may be waiting for multiple results in order to interpret others.  Please give [place] 48 hours in order for your provider to thoroughly review all the results before contacting the office for clarification of your results.   ++++++++++++++++++++++++++++++  Vit D  & Vit C 1,000 mg   are recommended to help protect  against the Covid-19 and other Corona viruses.    Also it's recommended  to take  Zinc 50 mg  to help  protect against the Covid-19   and best place to get  is also on [email or web address]  and don't pay more than 6-8 cents /pill !  ================================ Coronavirus (COVID-19) Are you at risk?  Are you at risk for the Coronavirus (COVID-19)?  To be considered HIGH RISK for Coronavirus (COVID-19), you have to meet the following criteria:  Traveled to [place], [place], [place], [place] or [place]; or in the [place] to [place], [place], [place]  or [place]; and have fever, cough, and shortness of breath within the last 2 weeks of travel OR Been in close contact with a person diagnosed with COVID-19 within the last 2 weeks and have  fever, cough,and shortness of breath  IF YOU DO NOT MEET THESE CRITERIA, YOU ARE CONSIDERED LOW RISK FOR COVID-19.  What to do if you are HIGH RISK for COVID-19?  If you are having a medical emergency, call 911. Seek medical care right away. Before you go to a doctor's office, urgent care or emergency department,  call ahead and tell them about your recent travel, contact with someone diagnosed with COVID-19   and your symptoms.  You should receive instructions from your physician's office regarding  next steps of care.  When you arrive at healthcare provider, tell the healthcare staff immediately you have returned from  visiting [place], [place], [place], [place] or [place]; or traveled in the [place] to [place], [place],  [place] or [place] in the last two weeks or you have been in close contact with a person diagnosed with  COVID-19 in the last 2 weeks.   Tell the health care staff about your symptoms: fever, cough and shortness of breath. After you have been seen by a medical provider, you will be either: Tested for (COVID-19) and discharged home on quarantine except to seek medical care if  symptoms worsen, and asked to  Stay home and avoid contact with others until you get your results (4-5 days)  Avoid travel on public transportation if possible (such as bus, train, or airplane) or Sent to the Emergency Department by EMS for evaluation, COVID-19 testing  and  possible admission depending on your condition and test results.  What to do if you are LOW RISK for COVID-19?  Reduce your risk of any infection by using the same precautions used for avoiding the common cold or flu:  Wash your hands often with soap and warm water for at least 20 seconds.  If soap and water are not readily available,  use an alcohol-based hand sanitizer with at least 60% alcohol.  If coughing or sneezing, cover your mouth and nose by coughing  or sneezing into the elbow areas of your shirt or coat,  into a tissue or into your sleeve (not your hands). Avoid shaking hands with others and consider head nods or verbal greetings only. Avoid touching your eyes, nose, or mouth with unwashed hands.  Avoid close contact with people who are sick. Avoid places or events with large numbers of people in one location, like concerts or sporting events. Carefully consider travel plans you have or are making. If you are planning any travel outside or inside the [place], visit the CDC's Travelers' Health webpage for  the latest health notices. If you have some symptoms but not all symptoms, continue to monitor at home and seek medical attention  if your symptoms worsen. If you are having a medical emergency, call 911. >>>>>>>>>>>>>>>>>>>>>>> Preventive Care for Adults  A healthy lifestyle and preventive care can promote health and wellness. Preventive health guidelines for women include the following key practices. A routine yearly physical is a good way to check with your health care provider about your health and preventive screening. It is a chance to share any concerns and updates on your health and to receive a thorough exam. Visit your dentist for a routine exam and preventive care every 6 months. Brush your teeth twice a day and floss once a day. Good oral hygiene prevents tooth decay and gum disease. The frequency of eye exams is based on your age, health, family medical history, use of contact lenses, and other factors. Follow your health care provider's recommendations for frequency of eye exams. Eat a healthy diet. Foods like vegetables, fruits, whole grains, low-fat dairy products, and lean protein foods contain the nutrients you need without too many calories. Decrease your intake of foods high in solid fats, added sugars, and salt. Eat the right amount of calories for you. Get information about a proper diet from your health care provider, if necessary. Regular physical exercise is one of the most important things you can do for your health. Most adults should get at least 150 minutes of moderate-intensity exercise (any activity that increases your heart rate and causes you to sweat) each week. In addition, most adults need muscle-strengthening exercises on 2 or more days a week. Maintain a healthy weight. The body mass index (BMI) is a screening tool to identify possible weight problems. It provides an estimate of body fat based on height and weight. Your health care provider can find your BMI and can  help you achieve or maintain a healthy weight. For adults 20 years and older: A BMI below 18.5 is considered underweight. A BMI of 18.5 to 24.9 is normal. A BMI of 25 to 29.9 is considered overweight. A BMI of 30 and above is considered obese. Maintain normal blood lipids and cholesterol levels by exercising and minimizing your intake of saturated fat. Eat a balanced diet with plenty of fruit and vegetables. Blood tests for lipids and cholesterol should begin at age 43 and be repeated every 5 years. If your lipid or cholesterol levels are high, you are over 50, or you are at high risk for heart disease, you may need your cholesterol levels checked more frequently. Ongoing high lipid and cholesterol levels should be treated with medicines if diet and exercise are not working. If you smoke, find out from your health care provider how to quit. If you do not use tobacco, do not start. Lung cancer screening is recommended for adults aged 11-80 years who are at high risk for developing  lung cancer because of a history of smoking. A yearly low-dose CT scan of the lungs is recommended for people who have at least a 30-pack-year history of smoking and are a current smoker or have quit within the past 15 years. A pack year of smoking is smoking an average of 1 pack of cigarettes a day for 1 year (for example: 1 pack a day for 30 years or 2 packs a day for 15 years). Yearly screening should continue until the smoker has stopped smoking for at least 15 years. Yearly screening should be stopped for people who develop a health problem that would prevent them from having lung cancer treatment. High blood pressure causes heart disease and increases the risk of stroke. Your blood pressure should be checked at least every 1 to 2 years. Ongoing high blood pressure should be treated with medicines if weight loss and exercise do not work. If you are 32-3 years old, ask your health care provider if you should take aspirin to  prevent strokes. Diabetes screening involves taking a blood sample to check your fasting blood sugar level. This should be done once every 3 years, after age 32, if you are within normal weight and without risk factors for diabetes. Testing should be considered at a younger age or be carried out more frequently if you are overweight and have at least 1 risk factor for diabetes. Breast cancer screening is essential preventive care for women. You should practice "breast self-awareness." This means understanding the normal appearance and feel of your breasts and may include breast self-examination. Any changes detected, no matter how small, should be reported to a health care provider. Women in their 45s and 30s should have a clinical breast exam (CBE) by a health care provider as part of a regular health exam every 1 to 3 years. After age 83, women should have a CBE every year. Starting at age 92, women should consider having a mammogram (breast X-ray test) every year. Women who have a family history of breast cancer should talk to their health care provider about genetic screening. Women at a high risk of breast cancer should talk to their health care providers about having an MRI and a mammogram every year. Breast cancer gene (BRCA)-related cancer risk assessment is recommended for women who have family members with BRCA-related cancers. BRCA-related cancers include breast, ovarian, tubal, and peritoneal cancers. Having family members with these cancers may be associated with an increased risk for harmful changes (mutations) in the breast cancer genes BRCA1 and BRCA2. Results of the assessment will determine the need for genetic counseling and BRCA1 and BRCA2 testing. Routine pelvic exams to screen for cancer are no longer recommended for nonpregnant women who are considered low risk for cancer of the pelvic organs (ovaries, uterus, and vagina) and who do not have symptoms. Ask your health care provider if a  screening pelvic exam is right for you. If you have had past treatment for cervical cancer or a condition that could lead to cancer, you need Pap tests and screening for cancer for at least 20 years after your treatment. If Pap tests have been discontinued, your risk factors (such as having a new sexual partner) need to be reassessed to determine if screening should be resumed. Some women have medical problems that increase the chance of getting cervical cancer. In these cases, your health care provider may recommend more frequent screening and Pap tests. Colorectal cancer can be detected and often prevented. Most routine colorectal  cancer screening begins at the age of 10 years and continues through age 19 years. However, your health care provider may recommend screening at an earlier age if you have risk factors for colon cancer. On a yearly basis, your health care provider may provide home test kits to check for hidden blood in the stool. Use of a small camera at the end of a tube, to directly examine the colon (sigmoidoscopy or colonoscopy), can detect the earliest forms of colorectal cancer. Talk to your health care provider about this at age 28, when routine screening begins.  Direct exam of the colon should be repeated every 5-10 years through age 71 years, unless early forms of pre-cancerous polyps or small growths are found. Hepatitis C blood testing is recommended for all people born from [birth year] through 1965 and any individual with known risks for hepatitis C.  Osteoporosis is a disease in which the bones lose minerals and strength with aging. This can result in serious bone fractures or breaks. The risk of osteoporosis can be identified using a bone density scan. Women ages 21 years and over and women at risk for fractures or osteoporosis should discuss screening with their health care providers. Ask your health care provider whether you should take a calcium supplement or vitamin D to reduce the rate  of osteoporosis. Menopause can be associated with physical symptoms and risks. Hormone replacement therapy is available to decrease symptoms and risks. You should talk to your health care provider about whether hormone replacement therapy is right for you. Use sunscreen. Apply sunscreen liberally and repeatedly throughout the day. You should seek shade when your shadow is shorter than you. Protect yourself by wearing long sleeves, pants, a wide-brimmed hat, and sunglasses year round, whenever you are outdoors. Once a month, do a whole body skin exam, using a mirror to look at the skin on your back. Tell your health care provider of new moles, moles that have irregular borders, moles that are larger than a pencil eraser, or moles that have changed in shape or color. Stay current with required vaccines (immunizations). Influenza vaccine. All adults should be immunized every year. Tetanus, diphtheria, and acellular pertussis (Td, Tdap) vaccine. Pregnant women should receive 1 dose of Tdap vaccine during each pregnancy. The dose should be obtained regardless of the length of time since the last dose. Immunization is preferred during the 27th-36th week of gestation. An adult who has not previously received Tdap or who does not know her vaccine status should receive 1 dose of Tdap. This initial dose should be followed by tetanus and diphtheria toxoids (Td) booster doses every 10 years. Adults with an unknown or incomplete history of completing a 3-dose immunization series with Td-containing vaccines should begin or complete a primary immunization series including a Tdap dose. Adults should receive a Td booster every 10 years. Varicella vaccine. An adult without evidence of immunity to varicella should receive 2 doses or a second dose if she has previously received 1 dose. Pregnant females who do not have evidence of immunity should receive the first dose after pregnancy. This first dose should be obtained before  leaving the health care facility. The second dose should be obtained 4-8 weeks after the first dose. Human papillomavirus (HPV) vaccine. Females aged 13-26 years who have not received the vaccine previously should obtain the 3-dose series. The vaccine is not recommended for use in pregnant females. However, pregnancy testing is not needed before receiving a dose. If a female is found to  be pregnant after receiving a dose, no treatment is needed. In that case, the remaining doses should be delayed until after the pregnancy. Immunization is recommended for any person with an immunocompromised condition through the age of 55 years if she did not get any or all doses earlier. During the 3-dose series, the second dose should be obtained 4-8 weeks after the first dose. The third dose should be obtained 24 weeks after the first dose and 16 weeks after the second dose. Zoster vaccine. One dose is recommended for adults aged 15 years or older unless certain conditions are present. Measles, mumps, and rubella (MMR) vaccine. Adults born before [birth year] generally are considered immune to measles and mumps. Adults born in [birth year] or later should have 1 or more doses of MMR vaccine unless there is a contraindication to the vaccine or there is laboratory evidence of immunity to each of the three diseases. A routine second dose of MMR vaccine should be obtained at least 28 days after the first dose for students attending postsecondary schools, health care workers, or international travelers. People who received inactivated measles vaccine or an unknown type of measles vaccine during 1963-1967 should receive 2 doses of MMR vaccine. People who received inactivated mumps vaccine or an unknown type of mumps vaccine before 1979 and are at high risk for mumps infection should consider immunization with 2 doses of MMR vaccine. For females of childbearing age, rubella immunity should be determined. If there is no evidence of immunity, females  who are not pregnant should be vaccinated. If there is no evidence of immunity, females who are pregnant should delay immunization until after pregnancy. Unvaccinated health care workers born before [birth year] who lack laboratory evidence of measles, mumps, or rubella immunity or laboratory confirmation of disease should consider measles and mumps immunization with 2 doses of MMR vaccine or rubella immunization with 1 dose of MMR vaccine. Pneumococcal 13-valent conjugate (PCV13) vaccine. When indicated, a person who is uncertain of her immunization history and has no record of immunization should receive the PCV13 vaccine. An adult aged 4 years or older who has certain medical conditions and has not been previously immunized should receive 1 dose of PCV13 vaccine. This PCV13 should be followed with a dose of pneumococcal polysaccharide (PPSV23) vaccine. The PPSV23 vaccine dose should be obtained at least 1 or more year(s) after the dose of PCV13 vaccine. An adult aged 15 years or older who has certain medical conditions and previously received 1 or more doses of PPSV23 vaccine should receive 1 dose of PCV13. The PCV13 vaccine dose should be obtained 1 or more years after the last PPSV23 vaccine dose.  Pneumococcal polysaccharide (PPSV23) vaccine. When PCV13 is also indicated, PCV13 should be obtained first. All adults aged 9 years and older should be immunized. An adult younger than age 14 years who has certain medical conditions should be immunized. Any person who resides in a nursing home or long-term care facility should be immunized. An adult smoker should be immunized. People with an immunocompromised condition and certain other conditions should receive both PCV13 and PPSV23 vaccines. People with human immunodeficiency virus (HIV) infection should be immunized as soon as possible after diagnosis. Immunization during chemotherapy or radiation therapy should be avoided. Routine use of PPSV23 vaccine is not  recommended for American Indians, [place] Natives, or people younger than 65 years unless there are medical conditions that require PPSV23 vaccine. When indicated, people who have unknown immunization and have no record of immunization should receive  PPSV23 vaccine. One-time revaccination 5 years after the first dose of PPSV23 is recommended for people aged 19-64 years who have chronic kidney failure, nephrotic syndrome, asplenia, or immunocompromised conditions. People who received 1-2 doses of PPSV23 before age 36 years should receive another dose of PPSV23 vaccine at age 52 years or later if at least 5 years have passed since the previous dose. Doses of PPSV23 are not needed for people immunized with PPSV23 at or after age 34 years.  Preventive Services / Frequency  Ages 74 to 1 years Blood pressure check. Lipid and cholesterol check. Lung cancer screening. / Every year if you are aged 53-80 years and have a 30-pack-year history of smoking and currently smoke or have quit within the past 15 years. Yearly screening is stopped once you have quit smoking for at least 15 years or develop a health problem that would prevent you from having lung cancer treatment. Clinical breast exam.** / Every year after age 16 years.  BRCA-related cancer risk assessment.** / For women who have family members with a BRCA-related cancer (breast, ovarian, tubal, or peritoneal cancers). Mammogram.** / Every year beginning at age 26 years and continuing for as long as you are in good health. Consult with your health care provider. Pap test.** / Every 3 years starting at age 19 years through age 52 or 29 years with a history of 3 consecutive normal Pap tests. HPV screening.** / Every 3 years from ages 18 years through ages 4 to 110 years with a history of 3 consecutive normal Pap tests. Fecal occult blood test (FOBT) of stool. / Every year beginning at age 70 years and continuing until age 53 years. You may not need to do this  test if you get a colonoscopy every 10 years. Flexible sigmoidoscopy or colonoscopy.** / Every 5 years for a flexible sigmoidoscopy or every 10 years for a colonoscopy beginning at age 66 years and continuing until age 44 years. Hepatitis C blood test.** / For all people born from [birth year] through 1966 and any individual with known risks for hepatitis C. Skin self-exam. / Monthly. Influenza vaccine. / Every year. Tetanus, diphtheria, and acellular pertussis (Tdap/Td) vaccine.** / Consult your health care provider. Pregnant women should receive 1 dose of Tdap vaccine during each pregnancy. 1 dose of Td every 10 years. Varicella vaccine.** / Consult your health care provider. Pregnant females who do not have evidence of immunity should receive the first dose after pregnancy. Zoster vaccine.** / 1 dose for adults aged 90 years or older. Pneumococcal 13-valent conjugate (PCV13) vaccine.** / Consult your health care provider. Pneumococcal polysaccharide (PPSV23) vaccine.** / 1 to 2 doses if you smoke cigarettes or if you have certain conditions. Meningococcal vaccine.** / Consult your health care provider. Hepatitis A vaccine.** / Consult your health care provider. Hepatitis B vaccine.** / Consult your health care provider. Screening for abdominal aortic aneurysm (AAA)  by ultrasound is recommended for people over 50 who have history of high blood pressure or who are current or former smokers. ++++++++++++++++++ Recommend Adult Low Dose Aspirin or  coated  Aspirin 81 mg daily  To reduce risk of Colon Cancer 40 %,  Skin Cancer 26 % ,  Melanoma 46%  and  Pancreatic cancer 60% +++++++++++++++++++ Vitamin D goal  is between 70-100.  Please make sure that you are taking your Vitamin D as directed.  It is very important as a natural anti-inflammatory  helping hair, skin, and nails, as well as reducing stroke and heart  attack risk.  It helps your bones and helps with mood. It also decreases numerous  cancer risks so please take it as directed.  Low Vit D is associated with a 200-300% higher risk for CANCER  and 200-300% higher risk for HEART   ATTACK  &  STROKE.   .....................................Marland Kitchen It is also associated with higher death rate at younger ages,  autoimmune diseases like Rheumatoid arthritis, Lupus, Multiple Sclerosis.    Also many other serious conditions, like depression, Alzheimer's Dementia, infertility, muscle aches, fatigue, fibromyalgia - just to name a few. ++++++++++++++++++ Recommend the book "The END of DIETING" by Dr Excell Seltzer  & the book "The END of DIABETES " by Dr Excell Seltzer At [email or web address] - get book & Audio CD's    Being diabetic has a  300% increased risk for heart attack, stroke, cancer, and alzheimer- type vascular dementia. It is very important that you work harder with diet by avoiding all foods that are white. Avoid white rice (brown & wild rice is OK), white potatoes (sweetpotatoes in moderation is OK), White bread or wheat bread or anything made out of white flour like bagels, donuts, rolls, buns, biscuits, cakes, pastries, cookies, pizza crust, and pasta (made from white flour & egg whites) - vegetarian pasta or spinach or wheat pasta is OK. Multigrain breads like Arnold's or [employer], or multigrain sandwich thins or flatbreads.  Diet, exercise and weight loss can reverse and cure diabetes in the early stages.  Diet, exercise and weight loss is very important in the control and prevention of complications of diabetes which affects every system in your body, ie. Brain - dementia/stroke, eyes - glaucoma/blindness, heart - heart attack/heart failure, kidneys - dialysis, stomach - gastric paralysis, intestines - malabsorption, nerves - severe painful neuritis, circulation - gangrene & loss of a leg(s), and finally cancer and Alzheimers.    I recommend avoid fried & greasy foods,  sweets/candy, white rice (brown or wild rice or Quinoa is OK), white  potatoes (sweet potatoes are OK) - anything made from white flour - bagels, doughnuts, rolls, buns, biscuits,white and wheat breads, pizza crust and traditional pasta made of white flour & egg white(vegetarian pasta or spinach or wheat pasta is OK).  Multi-grain bread is OK - like multi-grain flat bread or sandwich thins. Avoid alcohol in excess. Exercise is also important.    Eat all the vegetables you want - avoid meat, especially red meat and dairy - especially cheese.  Cheese is the most concentrated form of trans-fats which is the worst thing to clog up our arteries. Veggie cheese is OK which can be found in the fresh produce section at [employer] or [employer] or [employer]  ++++++++++++++++++++++ DASH Eating Plan  DASH stands for "Dietary Approaches to Stop Hypertension."   The DASH eating plan is a healthy eating plan that has been shown to reduce high blood pressure (hypertension). Additional health benefits may include reducing the risk of type 2 diabetes mellitus, heart disease, and stroke. The DASH eating plan may also help with weight loss. WHAT DO I NEED TO KNOW ABOUT THE DASH EATING PLAN? For the DASH eating plan, you will follow these general guidelines: Choose foods with a percent daily value for sodium of less than 5% (as listed on the food label). Use salt-free seasonings or herbs instead of table salt or sea salt. Check with your health care provider or pharmacist before using salt substitutes. Eat lower-sodium products, often labeled as "lower sodium" or "no  salt added." Eat fresh foods. Eat more vegetables, fruits, and low-fat dairy products. Choose whole grains. Look for the word "whole" as the first word in the ingredient list. Choose fish  Limit sweets, desserts, sugars, and sugary drinks. Choose heart-healthy fats. Eat veggie cheese  Eat more home-cooked food and less restaurant, buffet, and fast food. Limit fried foods. Cook foods using methods other than  frying. Limit canned vegetables. If you do use them, rinse them well to decrease the sodium. When eating at a restaurant, ask that your food be prepared with less salt, or no salt if possible.                      WHAT FOODS CAN I EAT? Read Dr Fara Olden Fuhrman's books on The End of Dieting & The End of Diabetes  Grains Whole grain or whole wheat bread. Brown rice. Whole grain or whole wheat pasta. Quinoa, bulgur, and whole grain cereals. Low-sodium cereals. Corn or whole wheat flour tortillas. Whole grain cornbread. Whole grain crackers. Low-sodium crackers.  Vegetables Fresh or frozen vegetables (raw, steamed, roasted, or grilled). Low-sodium or reduced-sodium tomato and vegetable juices. Low-sodium or reduced-sodium tomato sauce and paste. Low-sodium or reduced-sodium canned vegetables.   Fruits All fresh, canned (in natural juice), or frozen fruits.  Protein Products  All fish and seafood.  Dried beans, peas, or lentils. Unsalted nuts and seeds. Unsalted canned beans.  Dairy Low-fat dairy products, such as skim or 1% milk, 2% or reduced-fat cheeses, low-fat ricotta or cottage cheese, or plain low-fat yogurt. Low-sodium or reduced-sodium cheeses.  Fats and Oils Tub margarines without trans fats. Light or reduced-fat mayonnaise and salad dressings (reduced sodium). Avocado. Safflower, olive, or canola oils. Natural peanut or almond butter.  Other Unsalted popcorn and pretzels. The items listed above may not be a complete list of recommended foods or beverages. Contact your dietitian for more options.  ++++++++++++++++++  WHAT FOODS ARE NOT RECOMMENDED? Grains/ White flour or wheat flour White bread. White pasta. White rice. Refined cornbread. Bagels and croissants. Crackers that contain trans fat.  Vegetables  Creamed or fried vegetables. Vegetables in a . Regular canned vegetables. Regular canned tomato sauce and paste. Regular tomato and vegetable juices.  Fruits Dried fruits.  Canned fruit in light or heavy syrup. Fruit juice.  Meat and Other Protein Products Meat in general - RED meat & White meat.  Fatty cuts of meat. Ribs, chicken wings, all processed meats as bacon, sausage, bologna, salami, fatback, hot dogs, bratwurst and packaged luncheon meats.  Dairy Whole or 2% milk, cream, half-and-half, and cream cheese. Whole-fat or sweetened yogurt. Full-fat cheeses or blue cheese. Non-dairy creamers and whipped toppings. Processed cheese, cheese spreads, or cheese curds.  Condiments Onion and garlic salt, seasoned salt, table salt, and sea salt. Canned and packaged gravies. Worcestershire sauce. Tartar sauce. Barbecue sauce. Teriyaki sauce. Soy sauce, including reduced sodium. Steak sauce. Fish sauce. Oyster sauce. Cocktail sauce. Horseradish. Ketchup and mustard. Meat flavorings and tenderizers. Bouillon cubes. Hot sauce. Tabasco sauce. Marinades. Taco seasonings. Relishes.  Fats and Oils Butter, stick margarine, lard, shortening and bacon fat. Coconut, palm kernel, or palm oils. Regular salad dressings.  Pickles and olives. Salted popcorn and pretzels.  The items listed above may not be a complete list of foods and beverages to avoid.

## 2021-03-22 ENCOUNTER — Other Ambulatory Visit: Payer: Self-pay | Admitting: Internal Medicine

## 2021-03-22 DIAGNOSIS — Z79899 Other long term (current) drug therapy: Secondary | ICD-10-CM

## 2021-03-22 DIAGNOSIS — N1832 Chronic kidney disease, stage 3b: Secondary | ICD-10-CM

## 2021-03-22 DIAGNOSIS — D631 Anemia in chronic kidney disease: Secondary | ICD-10-CM

## 2021-03-22 NOTE — Progress Notes (Signed)
============================================================ - Test results slightly outside the reference range are not unusual. If there is anything important, I will review this with you,  otherwise it is considered normal test values.  If you have further questions,  please do not hesitate to contact me at the office or via My Chart.  ============================================================ ============================================================  -  U/A shows WBC - Culture will take 2-3 days to finalize ============================================================ ============================================================  - Iron & Vitamin B12 levels are both Normal & OK  ============================================================ ============================================================  - Hgb / Red Cell Count is slightly lower- this likely is due to Kidney Disease &   - needs to be watched closer  - So  recommend call office to schedule a Lab Visit to                                                      recheck CBC &  Kidney functions in 1 month ============================================================ ============================================================  - Kidney functions are slightly decreased with Creatinine slightly increased  and   - Kidney functions still look a little dehydrated, So it's    Very important to drink adequate amounts of fluids to prevent permanent damage    - Recommend drink at least 6 bottles (16 ounces) of fluids /water /day = 96 Oz ~100 oz  - 100 oz = 3,000 cc or 3 liters / day  - >> That's 1 &1/2 bottles of a 2 liter soda bottle /day !  ============================================================ ============================================================  -  -  Total  Chol =    207    - Elevated             (  Ideal  or  Goal is less than 180  !  )   - &  -  Bad / Dangerous LDL  Chol =     110    - also Elevated               (  Ideal  or  Goal is less than 70  !  )    - Diet is very Important  - Cholesterol only comes from animal sources  - ie. meat, dairy, egg yolks  - Eat all the vegetables you want.  - Avoid meat, especially red meat - Beef AND Pork .  - Avoid cheese & dairy - milk & ice cream.     - Cheese is the most concentrated form of trans-fats which  is the worst thing to clog up our arteries.   - Veggie cheese is OK which can be found in the fresh  produce section at [employer] or [employer] or [employer] ============================================================ ============================================================  - Also Triglycerides (    342    ) or fats in blood are too high  (goal is less than 150)    - Recommend avoid fried & greasy foods,  sweets / candy,   - Avoid white rice  (brown or wild rice or Quinoa is OK),   - Avoid white potatoes  (sweet potatoes are OK)   - Avoid anything made from white flour  - bagels, doughnuts, rolls, buns, biscuits, white and   wheat breads, pizza crust and traditional  pasta made of white flour & egg white  - (vegetarian pasta or spinach or wheat pasta is OK).    - Multi-grain  bread is OK - like multi-grain flat bread or  sandwich thins.   - Avoid alcohol in excess.   - Exercise is also important. ============================================================ ============================================================  - A1c - Normal - No Diabetes   - Great !  ============================================================ ============================================================  -Vitamin D = 43 - Low   - Vitamin D goal is between 70-100.   - Please take Vitamin D 5,000 units  /daily    - It is very important as a natural anti-inflammatory and helping the  immune system protect against viral infections, like the Covid-19    helping hair, skin, and nails, as well as reducing stroke and  heart attack  risk.   - It helps your bones and helps with mood.  - It also decreases numerous cancer risks so please  take it as directed.   - Low Vit D is associated with a 200-300% higher risk for  CANCER   and 200-300% higher risk for HEART   ATTACK  &  STROKE.    - It is also associated with higher death rate at younger ages,   autoimmune diseases like Rheumatoid arthritis, Lupus,  Multiple Sclerosis.     - Also many other serious conditions, like depression, Alzheimer's  Dementia, infertility, muscle aches, fatigue, fibromyalgia   - just to name a few. ============================================================ ============================================================  - All Else - CBC  - Electrolytes - Liver - Magnesium & Thyroid    - all  Normal / OK ============================================================ ============================================================

## 2021-03-23 LAB — VITAMIN B12: Vitamin B-12: 905 pg/mL (ref 200–1100)

## 2021-03-23 LAB — COMPLETE METABOLIC PANEL WITHOUT GFR
AG Ratio: 1.7 (calc) (ref 1.0–2.5)
ALT: 50 U/L — ABNORMAL HIGH (ref 6–29)
AST: 28 U/L (ref 10–35)
Albumin: 3.8 g/dL (ref 3.6–5.1)
Alkaline phosphatase (APISO): 99 U/L (ref 37–153)
BUN/Creatinine Ratio: 13 (calc) (ref 6–22)
BUN: 17 mg/dL (ref 7–25)
CO2: 28 mmol/L (ref 20–32)
Calcium: 8.6 mg/dL (ref 8.6–10.4)
Chloride: 106 mmol/L (ref 98–110)
Creat: 1.35 mg/dL — ABNORMAL HIGH (ref 0.50–1.03)
Globulin: 2.2 g/dL (ref 1.9–3.7)
Glucose, Bld: 94 mg/dL (ref 65–99)
Potassium: 4.4 mmol/L (ref 3.5–5.3)
Sodium: 140 mmol/L (ref 135–146)
Total Bilirubin: 0.4 mg/dL (ref 0.2–1.2)
Total Protein: 6 g/dL — ABNORMAL LOW (ref 6.1–8.1)
eGFR: 46 mL/min/1.73m2 — ABNORMAL LOW

## 2021-03-23 LAB — URINALYSIS, ROUTINE W REFLEX MICROSCOPIC
Bacteria, UA: NONE SEEN /HPF
Bilirubin Urine: NEGATIVE
Glucose, UA: NEGATIVE
Hgb urine dipstick: NEGATIVE
Hyaline Cast: NONE SEEN /LPF
Ketones, ur: NEGATIVE
Nitrite: NEGATIVE
Protein, ur: NEGATIVE
RBC / HPF: NONE SEEN /HPF (ref 0–2)
Specific Gravity, Urine: 1.01 (ref 1.001–1.035)
Squamous Epithelial / HPF: NONE SEEN /HPF (ref <=5)
pH: <=5 (ref 5.0–8.0)

## 2021-03-23 LAB — IRON, TOTAL/TOTAL IRON BINDING CAP
%SAT: 35 % (calc) (ref 16–45)
Iron: 101 ug/dL (ref 45–160)
TIBC: 286 mcg/dL (calc) (ref 250–450)

## 2021-03-23 LAB — CBC WITH DIFFERENTIAL/PLATELET
Absolute Monocytes: 307 {cells}/uL (ref 200–950)
Basophils Absolute: 58 {cells}/uL (ref 0–200)
Basophils Relative: 1 %
Eosinophils Absolute: 180 {cells}/uL (ref 15–500)
Eosinophils Relative: 3.1 %
HCT: 33.9 % — ABNORMAL LOW (ref 35.0–45.0)
Hemoglobin: 11.4 g/dL — ABNORMAL LOW (ref 11.7–15.5)
Lymphs Abs: 1514 {cells}/uL (ref 850–3900)
MCH: 31.8 pg (ref 27.0–33.0)
MCHC: 33.6 g/dL (ref 32.0–36.0)
MCV: 94.4 fL (ref 80.0–100.0)
MPV: 9.7 fL (ref 7.5–12.5)
Monocytes Relative: 5.3 %
Neutro Abs: 3741 {cells}/uL (ref 1500–7800)
Neutrophils Relative %: 64.5 %
Platelets: 271 Thousand/uL (ref 140–400)
RBC: 3.59 Million/uL — ABNORMAL LOW (ref 3.80–5.10)
RDW: 11.3 % (ref 11.0–15.0)
Total Lymphocyte: 26.1 %
WBC: 5.8 Thousand/uL (ref 3.8–10.8)

## 2021-03-23 LAB — MICROALBUMIN / CREATININE URINE RATIO
Creatinine, Urine: 69 mg/dL (ref 20–275)
Microalb Creat Ratio: 4 mcg/mg creat (ref <30)
Microalb, Ur: 0.3 mg/dL

## 2021-03-23 LAB — HEMOGLOBIN A1C
Hgb A1c MFr Bld: 4.2 % of total Hgb (ref <5.7)
Mean Plasma Glucose: 74 mg/dL
eAG (mmol/L): 4.1 mmol/L

## 2021-03-23 LAB — URINE CULTURE
MICRO NUMBER:: 12555134
SPECIMEN QUALITY:: ADEQUATE

## 2021-03-23 LAB — LIPID PANEL
Cholesterol: 207 mg/dL — ABNORMAL HIGH (ref <200)
HDL: 51 mg/dL (ref >=50)
LDL Cholesterol (Calc): 110 mg/dL (calc) — ABNORMAL HIGH
Non-HDL Cholesterol (Calc): 156 mg/dL (calc) — ABNORMAL HIGH (ref <130)
Total CHOL/HDL Ratio: 4.1 (calc) (ref <5.0)
Triglycerides: 342 mg/dL — ABNORMAL HIGH (ref <150)

## 2021-03-23 LAB — MICROSCOPIC MESSAGE

## 2021-03-23 LAB — MAGNESIUM: Magnesium: 1.9 mg/dL (ref 1.5–2.5)

## 2021-03-23 LAB — VITAMIN D 25 HYDROXY (VIT D DEFICIENCY, FRACTURES): Vit D, 25-Hydroxy: 43 ng/mL (ref 30–100)

## 2021-03-23 LAB — TSH: TSH: 1.39 mIU/L (ref 0.40–4.50)

## 2021-03-23 LAB — INSULIN, RANDOM: Insulin: 41.4 u[IU]/mL — ABNORMAL HIGH

## 2021-04-10 ENCOUNTER — Other Ambulatory Visit: Payer: Self-pay | Admitting: Internal Medicine

## 2021-04-10 DIAGNOSIS — G8929 Other chronic pain: Secondary | ICD-10-CM

## 2021-04-10 DIAGNOSIS — M545 Low back pain, unspecified: Secondary | ICD-10-CM

## 2021-04-10 MED ORDER — DULOXETINE HCL 30 MG PO CPEP: ORAL_CAPSULE | ORAL | 3 refills | Status: DC

## 2021-04-10 MED ORDER — DEXAMETHASONE 4 MG PO TABS: ORAL_TABLET | ORAL | 0 refills | Status: DC

## 2021-04-16 ENCOUNTER — Other Ambulatory Visit: Payer: Self-pay | Admitting: Nurse Practitioner

## 2021-04-16 ENCOUNTER — Other Ambulatory Visit: Payer: Self-pay | Admitting: Obstetrics and Gynecology

## 2021-04-16 DIAGNOSIS — N94819 Vulvodynia, unspecified: Secondary | ICD-10-CM

## 2021-04-16 NOTE — Telephone Encounter (Signed)
Left message for patient to call to see if she needs refill on Rx.

## 2021-04-23 ENCOUNTER — Other Ambulatory Visit: Payer: Self-pay | Admitting: Internal Medicine

## 2021-04-23 ENCOUNTER — Encounter: Payer: Self-pay | Admitting: Internal Medicine

## 2021-04-23 DIAGNOSIS — N94819 Vulvodynia, unspecified: Secondary | ICD-10-CM

## 2021-04-23 DIAGNOSIS — E782 Mixed hyperlipidemia: Secondary | ICD-10-CM

## 2021-04-23 DIAGNOSIS — G8929 Other chronic pain: Secondary | ICD-10-CM

## 2021-04-23 DIAGNOSIS — G4700 Insomnia, unspecified: Secondary | ICD-10-CM

## 2021-04-23 MED ORDER — DULOXETINE HCL 30 MG PO CPEP: ORAL_CAPSULE | ORAL | 3 refills | Status: DC

## 2021-04-23 MED ORDER — TOPIRAMATE 50 MG PO TABS: ORAL_TABLET | ORAL | 1 refills | Status: DC

## 2021-04-23 MED ORDER — ROSUVASTATIN CALCIUM 20 MG PO TABS: ORAL_TABLET | ORAL | 3 refills | Status: DC

## 2021-04-23 MED ORDER — DEXAMETHASONE 4 MG PO TABS: ORAL_TABLET | ORAL | 0 refills | Status: DC

## 2021-04-23 MED ORDER — TRAZODONE HCL 150 MG PO TABS: ORAL_TABLET | ORAL | 3 refills | Status: DC

## 2021-04-23 MED ORDER — BUPROPION HCL ER (XL) 300 MG PO TB24
ORAL_TABLET | ORAL | 3 refills | Status: DC
Start: 2021-04-23 — End: 2021-06-06

## 2021-04-23 MED ORDER — GABAPENTIN 300 MG PO CAPS: ORAL_CAPSULE | ORAL | 3 refills | Status: DC

## 2021-05-02 ENCOUNTER — Ambulatory Visit: Payer: BC Managed Care – PPO | Admitting: Obstetrics and Gynecology

## 2021-05-20 ENCOUNTER — Encounter: Payer: Self-pay | Admitting: Internal Medicine

## 2021-05-20 ENCOUNTER — Other Ambulatory Visit: Payer: Self-pay | Admitting: Internal Medicine

## 2021-05-20 ENCOUNTER — Other Ambulatory Visit: Payer: Self-pay | Admitting: Obstetrics and Gynecology

## 2021-05-20 DIAGNOSIS — N94819 Vulvodynia, unspecified: Secondary | ICD-10-CM

## 2021-05-20 MED ORDER — PHENTERMINE HCL 37.5 MG PO TABS: ORAL_TABLET | ORAL | 0 refills | Status: DC

## 2021-06-01 ENCOUNTER — Other Ambulatory Visit: Payer: Self-pay

## 2021-06-01 DIAGNOSIS — G8929 Other chronic pain: Secondary | ICD-10-CM

## 2021-06-01 DIAGNOSIS — M545 Low back pain, unspecified: Secondary | ICD-10-CM

## 2021-06-01 MED ORDER — DULOXETINE HCL 30 MG PO CPEP: ORAL_CAPSULE | ORAL | 3 refills | Status: DC

## 2021-06-04 ENCOUNTER — Other Ambulatory Visit: Payer: Self-pay | Admitting: Internal Medicine

## 2021-06-04 DIAGNOSIS — M545 Low back pain, unspecified: Secondary | ICD-10-CM

## 2021-06-04 DIAGNOSIS — G8929 Other chronic pain: Secondary | ICD-10-CM

## 2021-06-04 MED ORDER — DEXAMETHASONE 4 MG PO TABS: ORAL_TABLET | ORAL | 0 refills | Status: DC

## 2021-06-06 ENCOUNTER — Other Ambulatory Visit: Payer: Self-pay | Admitting: Internal Medicine

## 2021-06-06 DIAGNOSIS — F900 Attention-deficit hyperactivity disorder, predominantly inattentive type: Secondary | ICD-10-CM

## 2021-06-06 DIAGNOSIS — K2100 Gastro-esophageal reflux disease with esophagitis, without bleeding: Secondary | ICD-10-CM

## 2021-06-06 MED ORDER — BUPROPION HCL ER (XL) 300 MG PO TB24: ORAL_TABLET | ORAL | 3 refills | Status: DC

## 2021-06-06 MED ORDER — PANTOPRAZOLE SODIUM 40 MG PO TBEC: DELAYED_RELEASE_TABLET | ORAL | 3 refills | Status: DC

## 2021-06-12 NOTE — Progress Notes (Signed)
56 y.o. [id number] Single White or Caucasian Not Hispanic or Latino female here for annual exam.  Not sexually active for a long time. No vaginal bleeding.   She is still having hot flashes. She runs hot all the time. Occasional flashes. No night sweats.   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 It has improved. The azo helps it the most, occurs with urination, gets a burning sensation and it persists. It doesn't feel like the urine hitting the skin is uncomfortable. She is using estrogen cream. Lidocaine ointment helps some.    The patient was seen on 02/20/21 with vulvar burning. Vaginitis panel was negative for infection. She was already on vaginal estrogen. 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 She was given lidocaine ointment, vulvar skin care was reviewed.  She was referred to PT. Didn't go. Was started on gabapentin (primary is giving her a larger dose for her back pain). She is taking 300 mg at night.  She is also on Cymbalta (from primary)  H/O kidney disease. Her last creatine was 1.35 with a GFR of 46 (on 03/21/21)  Patient's last menstrual period was 12/26/2015.          Sexually active: No.  The current method of family planning is post menopausal status.    Exercising: Yes.     Walking daily 2-3 miles  Smoker:  no  Health Maintenance: Pap:  06/03/2018 wnl Hr Hpv neg;  07/17/14 Wnl Hr HPV Neg History of abnormal Pap:  no MMG:  is scheduled for tomorrow  BMD:   none  Colonoscopy: 06/15/13 f/u 10 years  TDaP:  04/08/12  Gardasil: n/a   reports that she has never smoked. She has never used smokeless tobacco. She reports current alcohol use of about 1.0 standard drink per week. She reports that she does not use drugs. 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 She has 57 year old fraternal twin girls, had then with her prior partner. Both girls are local.   Past Medical History:  Diagnosis Date   Allergy    SEASONAL   Anxiety    Back injury    Barrett's esophagus    Chest  pain, unspecified    normal myoview 2000;  echo 5/10:  mild LVH, EF 60-65%   DDD (degenerative disc disease)    Depression    GERD (gastroesophageal reflux disease)    Hiatal hernia    HSV-1 (herpes simplex virus 1) infection    Hyperlipidemia    IBS (irritable bowel syndrome)    Insomnia    Lumbago    Melanosis coli    Palpitations    h/o symptomatic PVCs   Palpitations    Persistent disorder of initiating or maintaining sleep    Restless legs syndrome (RLS)     Past Surgical History:  Procedure Laterality Date   COLONOSCOPY  2013   ESOPHAGOGASTRODUODENOSCOPY N/A 03/04/2014   Procedure: ESOPHAGOGASTRODUODENOSCOPY (EGD);  Surgeon: Ladene Artist, MD;  Location: [facility];  Service: Endoscopy;  Laterality: N/A;   EYE SURGERY  2001   REFRACTIVE SURGERY     bilateral eyes    TUBES IN EARS     as an adult   UPPER GASTROINTESTINAL ENDOSCOPY      Current Outpatient Medications  Medication Sig Dispense Refill   betamethasone valerate ointment (VALISONE) 0.1 % Apply 1 application topically 2 (two) times daily. 30 g 0   buPROPion (WELLBUTRIN XL) 300 MG 24 hr tablet TAKE  1 TABLET  EVERY  MORNING  FOR MOOD, FOCUS AND CONCENTRATION 90 tablet 3   cyclobenzaprine (FLEXERIL) 10 MG tablet Take 1/2 to 1 tablet 3 x /day as needed for Muscle Spasm 90 tablet 3   DULoxetine (CYMBALTA) 30 MG capsule Take  1 capsule  2 x /day  for Chronic Back Pain 180 capsule 3   estradiol (ESTRACE) 0.1 MG/GM vaginal cream Apply a pea size amount two weeks at bedtime then 3 times a week 42.5 g 12   fluticasone (FLONASE) 50 MCG/ACT nasal spray Place 2 sprays into both nostrils as needed. Reported on 06/27/2015     gabapentin (NEURONTIN) 300 MG capsule Take  1 capsule 2 to 3 x /day as needed for Back Pain / Patient knows to take by mouth 270 capsule 3   lidocaine (XYLOCAINE) 5 % ointment Apply 1 application topically 4 (four) times daily as needed. 30 g 1   LINZESS 145 MCG CAPS capsule Take 145 mcg by mouth  daily.     pantoprazole (PROTONIX) 40 MG tablet TAKE 1 TABLET 2 TIMES DAILY TO PREVENT INDIGESTION AND HEARTBURN 180 tablet 3   phentermine (ADIPEX-P) 37.5 MG tablet TAKE 1 TABLET EVERY MORNING FOR DIETING & WEIGHT LOSS 90 tablet 0   rosuvastatin (CRESTOR) 20 MG tablet Take  1 tablet  Daily   for Cholesterol 90 tablet 3   topiramate (TOPAMAX) 50 MG tablet Take  1 tablet  2 x /day  at Suppertime & Bedtime  for Dieting & Weight Loss.  Patient knows to take by mouth 180 tablet 1   traZODone (DESYREL) 150 MG tablet Take  2 tablets  1 hour  before Bedtime as needed for Sleep 180 tablet 3   valACYclovir (VALTREX) 500 MG tablet Take 1 tablet Daily prophylaxis for Cold Sores / Fever Blisters 90 tablet 3   No current facility-administered medications for this visit.    Family History  Problem Relation Age of Onset   Heart disease Cousin    Lung cancer Cousin    Hypertension Mother    Osteoporosis Mother    Breast cancer Sister 23       BRCA NEG/BILAT MASTECTOMY/CHEMO X 1 YR   Hyperlipidemia Sister    Ovarian cancer Sister    Colon cancer Maternal Grandmother    Stroke Maternal Grandmother    Osteoporosis Maternal Grandmother    Colon cancer Maternal Grandfather    Multiple myeloma Paternal Grandmother    Hypertension Father    Hyperlipidemia Father    Esophageal cancer Neg Hx    Liver cancer Neg Hx    Pancreatic cancer Neg Hx    Rectal cancer Neg Hx    Stomach cancer Neg Hx     Review of Systems  Exam:   BP 110/70    Pulse 66    Ht _0  (1.753 m)    Wt 167 lb (75.8 kg)    LMP 12/26/2015    SpO2 99%    BMI 24.66 kg/m   Weight change: _1 @ Height:   Height: _2  (175.3 cm)  Ht Readings from Last 3 Encounters:  06/14/21 _3  (1.753 m)  03/21/21 _4  (1.753 m)  03/19/21 _5  (1.753 m)    General appearance: alert, cooperative and appears stated age Head: Normocephalic, without obvious abnormality, atraumatic Neck: no adenopathy, supple, symmetrical, trachea midline  and thyroid normal to inspection and palpation Lungs: clear to auscultation bilaterally Cardiovascular: regular rate and rhythm Breasts: normal appearance, no masses or tenderness Abdomen: soft, non-tender; non  distended,  no masses,  no organomegaly Extremities: extremities normal, atraumatic, no cyanosis or edema Skin: Skin color, texture, turgor normal. No rashes or lesions Lymph nodes: Cervical, supraclavicular, and axillary nodes normal. No abnormal inguinal nodes palpated Neurologic: Grossly normal   Pelvic: External genitalia:  no lesions. Not tender to palpation at the vestibule.               Urethra:  normal appearing urethra with no masses, tenderness or lesions              Bartholins and Skenes: normal                 Vagina: atrophic appearing vagina with normal color and discharge, no lesions              Cervix:  small, pedunculated nabothian cyst at the external os               Bimanual Exam:  Uterus:  normal size, contour, position, consistency, mobility, non-tender              Adnexa: no mass, fullness, tenderness               Rectovaginal: Confirms               Anus:  normal sphincter tone, no lesions  Karmen Bongo, RN chaperoned for the exam.  1. Well woman exam Discussed breast self exam Discussed calcium and vit D intake No pap this year Mammogram tomorrow Colonoscopy UTD  2. Vaginal atrophy - estradiol (ESTRACE) 0.1 MG/GM vaginal cream; Apply 1 gram vaginally 2 x a week at hs  Dispense: 42.5 g; Refill: 1  3. 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 She has some urethral pain and will f/u with Urology for that.  - lidocaine (XYLOCAINE) 5 % ointment; Apply 1 application topically 4 (four) times daily as needed.  Dispense: 30 g; Refill: 1

## 2021-06-14 ENCOUNTER — Other Ambulatory Visit: Payer: Self-pay

## 2021-06-14 ENCOUNTER — Ambulatory Visit (INDEPENDENT_AMBULATORY_CARE_PROVIDER_SITE_OTHER): Payer: BC Managed Care – PPO | Admitting: Obstetrics and Gynecology

## 2021-06-14 ENCOUNTER — Encounter: Payer: Self-pay | Admitting: Obstetrics and Gynecology

## 2021-06-14 VITALS — BP 110/70 | HR 66 | Ht 69.0 in | Wt 167.0 lb

## 2021-06-14 DIAGNOSIS — N952 Postmenopausal atrophic vaginitis: Secondary | ICD-10-CM | POA: Diagnosis not present

## 2021-06-14 DIAGNOSIS — Z01419 Encounter for gynecological examination (general) (routine) without abnormal findings: Secondary | ICD-10-CM

## 2021-06-14 DIAGNOSIS — N94819 Vulvodynia, unspecified: Secondary | ICD-10-CM | POA: Diagnosis not present

## 2021-06-14 MED ORDER — ESTRADIOL 0.1 MG/GM VA CREA: TOPICAL_CREAM | VAGINAL | 1 refills | Status: DC

## 2021-06-14 MED ORDER — LIDOCAINE 5 % EX OINT: 1.0000 "application " | TOPICAL_OINTMENT | Freq: Four times a day (QID) | CUTANEOUS | 1 refills | Status: DC | PRN

## 2021-06-14 NOTE — Patient Instructions (Signed)

## 2021-06-15 DIAGNOSIS — Z1231 Encounter for screening mammogram for malignant neoplasm of breast: Secondary | ICD-10-CM | POA: Diagnosis not present

## 2021-06-15 LAB — HM MAMMOGRAPHY

## 2021-06-18 ENCOUNTER — Other Ambulatory Visit: Payer: Self-pay | Admitting: Internal Medicine

## 2021-06-18 ENCOUNTER — Other Ambulatory Visit: Payer: Self-pay | Admitting: Adult Health Nurse Practitioner

## 2021-06-18 ENCOUNTER — Encounter: Payer: Self-pay | Admitting: Internal Medicine

## 2021-06-18 MED ORDER — CEPHALEXIN 500 MG PO CAPS: ORAL_CAPSULE | ORAL | 0 refills | Status: DC

## 2021-06-19 ENCOUNTER — Other Ambulatory Visit: Payer: Self-pay

## 2021-06-19 ENCOUNTER — Encounter: Payer: Self-pay | Admitting: Internal Medicine

## 2021-06-19 ENCOUNTER — Ambulatory Visit (INDEPENDENT_AMBULATORY_CARE_PROVIDER_SITE_OTHER): Payer: BC Managed Care – PPO | Admitting: Internal Medicine

## 2021-06-19 VITALS — BP 137/89 | HR 96 | Temp 97.9°F | Resp 16 | Ht 69.0 in | Wt 169.0 lb

## 2021-06-19 DIAGNOSIS — L0211 Cutaneous abscess of neck: Secondary | ICD-10-CM

## 2021-06-19 MED ORDER — DOXYCYCLINE HYCLATE 100 MG PO CAPS: ORAL_CAPSULE | ORAL | 0 refills | Status: DC

## 2021-06-19 NOTE — Progress Notes (Signed)
Future Appointments  Date Time Provider Department  06/19/2021 11:30 AM Unk Pinto, MD [facility]  07/04/2021 11:00 AM Hollice Espy, MD [facility]  09/19/2021  2:30 PM Unk Pinto, MD [facility]  03/21/2022  2:00 PM Unk Pinto, MD [facility]    History of Present Illness:       Patient is a very nice 56 yo single WF who presents with concerns of a "lump"  suspected boil or abscess of her occiput area which she scratched & it did drain some purulent secretions.  She was started on g Keflex yesterday for concern of possible draining abscess.  Medications    rosuvastatin (CRESTOR) 20 MG tablet, Take  1 tablet  Daily   for Cholesterol   fluticasone (FLONASE) 50 MCG/ACT nasal spray, Place 2 sprays into both nostrils as needed. Reported on 06/27/2015   betamethasone valerate ointment (VALISONE) 0.1 %, Apply 1 application topically 2 (two) times daily.   buPROPion (WELLBUTRIN XL) 300 MG 24 hr tablet, TAKE  1 TABLET  EVERY MORNING  FOR MOOD, FOCUS AND CONCENTRATION   cephALEXin (KEFLEX) 500 MG capsule, Take 1 capsule 4 x /day with meals/food for Skin Infection   cyclobenzaprine (FLEXERIL) 10 MG tablet, Take 1/2 to 1 tablet 3 x /day as needed for Muscle Spasm   DULoxetine (CYMBALTA) 30 MG capsule, Take  1 capsule  2 x /day  for Chronic Back Pain   estradiol (ESTRACE) 0.1 MG/GM vaginal cream, Apply 1 gram vaginally 2 x a week at hs   gabapentin (NEURONTIN) 300 MG capsule, Take  1 capsule 2 to 3 x /day as needed for Back Pain / Patient knows to take by mouth   lidocaine (XYLOCAINE) 5 % ointment, Apply 1 application topically 4 (four) times daily as needed.   linaclotide (LINZESS) 145 MCG CAPS capsule, Take  1 capsule  Daily for IBS - C   pantoprazole (PROTONIX) 40 MG tablet, TAKE 1 TABLET 2 TIMES DAILY TO PREVENT INDIGESTION AND HEARTBURN   phentermine (ADIPEX-P) 37.5 MG tablet, TAKE 1 TABLET EVERY MORNING FOR DIETING & WEIGHT LOSS   topiramate (TOPAMAX) 50 MG tablet, Take  1 tablet   2 x /day  at Suppertime & Bedtime  for Dieting & Weight Loss.  Patient knows to take by mouth   traZODone (DESYREL) 150 MG tablet, Take  2 tablets  1 hour  before Bedtime as needed for Sleep   valACYclovir (VALTREX) 500 MG tablet, Take 1 tablet Daily prophylaxis for Cold Sores / Fever Blisters  Problem list She has OSA (obstructive sleep apnea); RESTLESS LEGS SYNDROME; HNP (herniated nucleus pulposus), lumbar; Hyperlipidemia; Lumbago; Insomnia; HSV-1 (herpes simplex virus 1) infection; DDD (degenerative disc disease); GERD (gastroesophageal reflux disease); Barrett's esophagus; Vitamin D deficiency; Abnormal glucose; Medication management; IBS (irritable colon syndrome); Labile hypertension; Memory loss; Chronic daily headache; Generalized anxiety disorder; CKD (chronic kidney disease) stage 3, GFR 30-59 ml/min; Overweight (BMI 25.0-29.9); and Chronic constipation on their problem list.   Observations/Objective:  BP 137/89    Pulse 96    Temp 97.9 F (36.6 C)    Resp 16    Ht 5\' 9"  (1.753 m)    Wt 169 lb (76.7 kg)    LMP 12/26/2015    SpO2 95%    BMI 24.96 kg/m   HEENT - WNL. Neck supple. Skin of suboccipital scap finds a small dry apparently dehissed abscess or boil. No lymphangitic streaking.   Assessment and Plan:   1. Neck abscess  - Switch from Keflex  to Doxycycline bid    Follow Up Instructions:        I discussed the assessment and treatment plan with the patient. The patient was provided an opportunity to ask questions and all were answered. The patient agreed with the plan and demonstrated an understanding of the instructions.       The patient was advised to call back or seek an in-person evaluation if the symptoms worsen or if the condition fails to improve as anticipated.    Kirtland Bouchard, MD

## 2021-06-20 ENCOUNTER — Encounter: Payer: Self-pay | Admitting: Internal Medicine

## 2021-06-20 ENCOUNTER — Ambulatory Visit: Payer: BC Managed Care – PPO | Admitting: Internal Medicine

## 2021-07-03 ENCOUNTER — Other Ambulatory Visit: Payer: Self-pay | Admitting: Internal Medicine

## 2021-07-03 ENCOUNTER — Encounter: Payer: Self-pay | Admitting: Internal Medicine

## 2021-07-03 MED ORDER — DOXYCYCLINE HYCLATE 100 MG PO CAPS: ORAL_CAPSULE | ORAL | 0 refills | Status: DC

## 2021-07-03 MED ORDER — CEPHALEXIN 500 MG PO CAPS: ORAL_CAPSULE | ORAL | 0 refills | Status: DC

## 2021-07-03 NOTE — Progress Notes (Incomplete)
07/03/21 2:12 PM   Pamela Welch 03-31-1966 [id number]  Referring provider:  Unk Pinto, MD [address] [place],  [place] [address] No chief complaint on file.    HPI: Pamela Welch is a 56 y.o.female with a personal history of vaginal atrophy, stage 3a chronic kidney disease with creatinine level, and rUTI, who presents today for further evaluation of rUTI.   She was seen by Dr. Lawson Radar at [facility], he suggested a referral to urology for bladder and urinary track symptoms.   She has IBS.   She had negative STI testing in 01/2021.      PMH: Past Medical History:  Diagnosis Date   Allergy    SEASONAL   Anxiety    Back injury    Barrett's esophagus    Chest pain, unspecified    normal myoview 2000;  echo 5/10:  mild LVH, EF 60-65%   DDD (degenerative disc disease)    Depression    GERD (gastroesophageal reflux disease)    Hiatal hernia    HSV-1 (herpes simplex virus 1) infection    Hyperlipidemia    IBS (irritable bowel syndrome)    Insomnia    Lumbago    Melanosis coli    Palpitations    h/o symptomatic PVCs   Palpitations    Persistent disorder of initiating or maintaining sleep    Restless legs syndrome (RLS)     Surgical History: Past Surgical History:  Procedure Laterality Date   COLONOSCOPY  2013   ESOPHAGOGASTRODUODENOSCOPY N/A 03/04/2014   Procedure: ESOPHAGOGASTRODUODENOSCOPY (EGD);  Surgeon: Ladene Artist, MD;  Location: [facility];  Service: Endoscopy;  Laterality: N/A;   EYE SURGERY  2001   REFRACTIVE SURGERY     bilateral eyes    TUBES IN EARS     as an adult   UPPER GASTROINTESTINAL ENDOSCOPY      Home Medications:  Allergies as of 07/04/2021       Reactions   Atenolol Other (See Comments)   Made patient feel like a "zombie"   Ambien [zolpidem Tartrate]    Memory issues   Cholestatin    Hot flashes   Mirapex [pramipexole Dihydrochloride]    Hot flashes   Requip [ropinirole Hcl]     Hot flashes        Medication List        Accurate as of July 03, 2021  2:12 PM. If you have any questions, ask your nurse or doctor.          betamethasone valerate ointment 0.1 % Commonly known as: VALISONE Apply 1 application topically 2 (two) times daily.   buPROPion 300 MG 24 hr tablet Commonly known as: WELLBUTRIN XL TAKE  1 TABLET  EVERY MORNING  FOR MOOD, FOCUS AND CONCENTRATION   cephALEXin 500 MG capsule Commonly known as: Keflex Take 1 capsule 4 x /day with meals/food for Skin Infection   cyclobenzaprine 10 MG tablet Commonly known as: FLEXERIL Take 1/2 to 1 tablet 3 x /day as needed for Muscle Spasm   doxycycline 100 MG capsule Commonly known as: VIBRAMYCIN Take 1 capsule 2 x /day with meals for Infection   DULoxetine 30 MG capsule Commonly known as: Cymbalta Take  1 capsule  2 x /day  for Chronic Back Pain   estradiol 0.1 MG/GM vaginal cream Commonly known as: ESTRACE Apply 1 gram vaginally 2 x a week at hs   fluticasone 50 MCG/ACT nasal spray Commonly known as:  FLONASE Place 2 sprays into both nostrils as needed. Reported on 06/27/2015   gabapentin 300 MG capsule Commonly known as: NEURONTIN Take  1 capsule 2 to 3 x /day as needed for Back Pain / Patient knows to take by mouth   lidocaine 5 % ointment Commonly known as: XYLOCAINE Apply 1 application topically 4 (four) times daily as needed.   linaclotide 145 MCG Caps capsule Commonly known as: Linzess Take  1 capsule  Daily for IBS - C   pantoprazole 40 MG tablet Commonly known as: PROTONIX TAKE 1 TABLET 2 TIMES DAILY TO PREVENT INDIGESTION AND HEARTBURN   phentermine 37.5 MG tablet Commonly known as: ADIPEX-P TAKE 1 TABLET EVERY MORNING FOR DIETING & WEIGHT LOSS   rosuvastatin 20 MG tablet Commonly known as: CRESTOR Take  1 tablet  Daily   for Cholesterol   topiramate 50 MG tablet Commonly known as: TOPAMAX Take  1 tablet  2 x /day  at Suppertime & Bedtime  for Dieting &  Weight Loss.  Patient knows to take by mouth   traZODone 150 MG tablet Commonly known as: DESYREL Take  2 tablets  1 hour  before Bedtime as needed for Sleep   valACYclovir 500 MG tablet Commonly known as: VALTREX Take 1 tablet Daily prophylaxis for Cold Sores / Fever Blisters        Allergies:  Allergies  Allergen Reactions   Atenolol Other (See Comments)    Made patient feel like a "zombie"   Ambien [Zolpidem Tartrate]     Memory issues   Cholestatin     Hot flashes   Mirapex [Pramipexole Dihydrochloride]     Hot flashes   Requip [Ropinirole Hcl]     Hot flashes    Family History: Family History  Problem Relation Age of Onset   Heart disease Cousin    Lung cancer Cousin    Hypertension Mother    Osteoporosis Mother    Breast cancer Sister 63       BRCA NEG/BILAT MASTECTOMY/CHEMO X 1 YR   Hyperlipidemia Sister    Ovarian cancer Sister    Colon cancer Maternal Grandmother    Stroke Maternal Grandmother    Osteoporosis Maternal Grandmother    Colon cancer Maternal Grandfather    Multiple myeloma Paternal Grandmother    Hypertension Father    Hyperlipidemia Father    Esophageal cancer Neg Hx    Liver cancer Neg Hx    Pancreatic cancer Neg Hx    Rectal cancer Neg Hx    Stomach cancer Neg Hx     Social History:  reports that she has never smoked. She has never used smokeless tobacco. She reports current alcohol use of about 1.0 standard drink per week. She reports that she does not use drugs.   Physical Exam: LMP 12/26/2015   Constitutional:  Alert and oriented, No acute distress. HEENT: [place] AT, moist mucus membranes.  Trachea midline, no masses. Cardiovascular: No clubbing, cyanosis, or edema. Respiratory: Normal respiratory effort, no increased work of breathing. Skin: No rashes, bruises or suspicious lesions. Neurologic: Grossly intact, no focal deficits, moving all 4 extremities. Psychiatric: Normal mood and affect.  Laboratory Data:  Lab Results   Component Value Date   CREATININE 1.35 (H) 03/21/2021   Lab Results  Component Value Date   HGBA1C 4.2 03/21/2021    Urinalysis   Pertinent Imaging:    Assessment & Plan:     No follow-ups on file.  I,Kailey Littlejohn,acting as a Education administrator for  Hollice Espy, MD.,have documented all relevant documentation on the behalf of Hollice Espy, MD,as directed by  Hollice Espy, MD while in the presence of Hollice Espy, MD.  [facility] [address] [place], [place] [address] [phone]

## 2021-07-04 ENCOUNTER — Ambulatory Visit: Payer: BC Managed Care – PPO | Admitting: Urology

## 2021-07-05 ENCOUNTER — Other Ambulatory Visit: Payer: Self-pay | Admitting: Internal Medicine

## 2021-07-05 MED ORDER — DOXYCYCLINE HYCLATE 100 MG PO CAPS: ORAL_CAPSULE | ORAL | 0 refills | Status: DC

## 2021-07-18 ENCOUNTER — Ambulatory Visit: Payer: BC Managed Care – PPO | Admitting: Urology

## 2021-08-13 ENCOUNTER — Other Ambulatory Visit: Payer: Self-pay | Admitting: Internal Medicine

## 2021-08-13 DIAGNOSIS — E782 Mixed hyperlipidemia: Secondary | ICD-10-CM

## 2021-08-13 DIAGNOSIS — G4700 Insomnia, unspecified: Secondary | ICD-10-CM

## 2021-08-14 ENCOUNTER — Ambulatory Visit: Payer: BC Managed Care – PPO | Admitting: Urology

## 2021-08-14 ENCOUNTER — Encounter: Payer: Self-pay | Admitting: *Deleted

## 2021-08-20 ENCOUNTER — Other Ambulatory Visit: Payer: Self-pay | Admitting: Internal Medicine

## 2021-08-20 MED ORDER — PHENTERMINE HCL 37.5 MG PO TABS: ORAL_TABLET | ORAL | 1 refills | Status: DC

## 2021-08-27 ENCOUNTER — Other Ambulatory Visit: Payer: Self-pay | Admitting: Internal Medicine

## 2021-08-27 ENCOUNTER — Other Ambulatory Visit: Payer: Self-pay | Admitting: Obstetrics and Gynecology

## 2021-08-27 DIAGNOSIS — N9489 Other specified conditions associated with female genital organs and menstrual cycle: Secondary | ICD-10-CM

## 2021-09-11 ENCOUNTER — Other Ambulatory Visit: Payer: Self-pay | Admitting: Internal Medicine

## 2021-09-11 ENCOUNTER — Encounter: Payer: Self-pay | Admitting: Internal Medicine

## 2021-09-17 NOTE — Telephone Encounter (Signed)
Can you check if this needs a prior [place]

## 2021-09-19 ENCOUNTER — Ambulatory Visit: Payer: BC Managed Care – PPO | Admitting: Internal Medicine

## 2021-09-26 ENCOUNTER — Ambulatory Visit: Payer: BC Managed Care – PPO | Admitting: Urology

## 2021-11-19 ENCOUNTER — Other Ambulatory Visit: Payer: Self-pay | Admitting: Nurse Practitioner

## 2021-11-19 DIAGNOSIS — G4700 Insomnia, unspecified: Secondary | ICD-10-CM

## 2021-11-24 ENCOUNTER — Other Ambulatory Visit: Payer: Self-pay | Admitting: Internal Medicine

## 2021-11-24 ENCOUNTER — Encounter: Payer: Self-pay | Admitting: Internal Medicine

## 2021-11-24 DIAGNOSIS — M545 Low back pain, unspecified: Secondary | ICD-10-CM

## 2021-11-25 ENCOUNTER — Other Ambulatory Visit: Payer: Self-pay | Admitting: Internal Medicine

## 2021-11-25 DIAGNOSIS — N952 Postmenopausal atrophic vaginitis: Secondary | ICD-10-CM

## 2021-11-25 DIAGNOSIS — N94819 Vulvodynia, unspecified: Secondary | ICD-10-CM

## 2021-11-25 MED ORDER — LIDOCAINE 5 % EX OINT: 1.0000 | TOPICAL_OINTMENT | Freq: Four times a day (QID) | CUTANEOUS | 1 refills | Status: DC | PRN

## 2021-11-25 MED ORDER — ESTRADIOL 0.1 MG/GM VA CREA: TOPICAL_CREAM | VAGINAL | 3 refills | Status: DC

## 2021-11-29 ENCOUNTER — Other Ambulatory Visit: Payer: Self-pay | Admitting: Internal Medicine

## 2021-11-29 DIAGNOSIS — N952 Postmenopausal atrophic vaginitis: Secondary | ICD-10-CM

## 2021-11-29 MED ORDER — ESTRADIOL 0.1 MG/GM VA CREA: TOPICAL_CREAM | VAGINAL | 3 refills | Status: DC

## 2021-12-01 IMAGING — US US RENAL
1 series · 14 of 25 positions shown · non-contrast
Comparison: Prior ultrasound from 03/05/2014.

CLINICAL DATA: Initial evaluation for stage IIIb chronic kidney
disease.

EXAM:
RENAL / URINARY TRACT ULTRASOUND COMPLETE

[Series 1: us renal · 0.20mm/px · 14 of 45 slices shown]
[im 1/45]
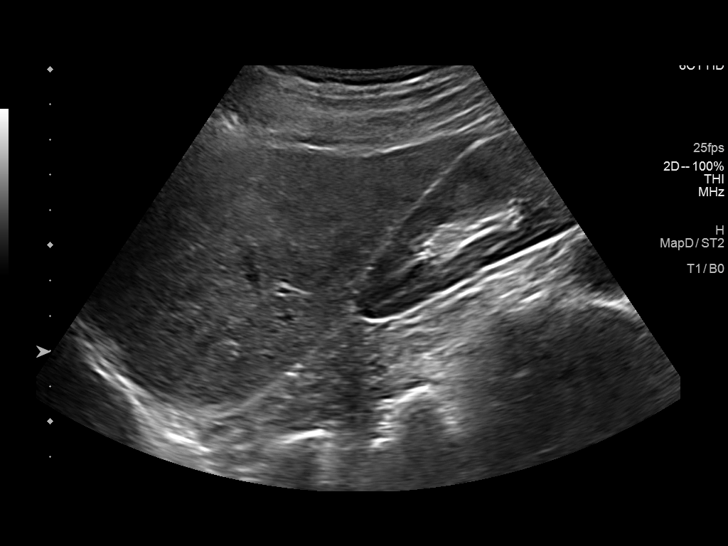
[im 4/45]
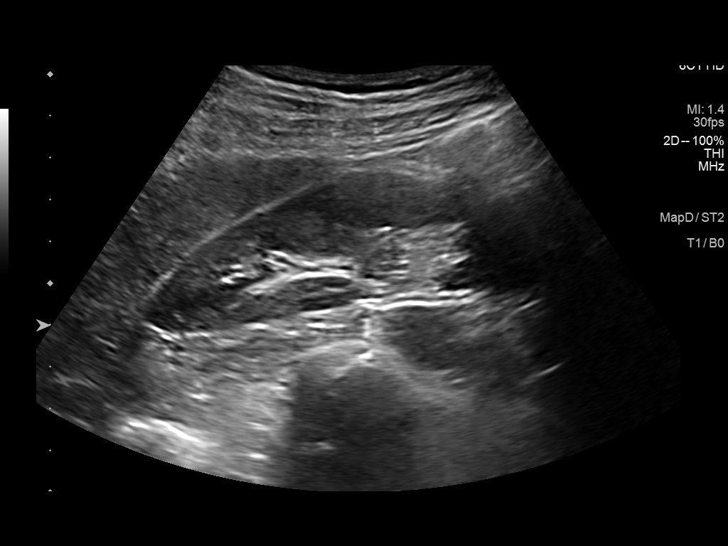
[im 8/45]
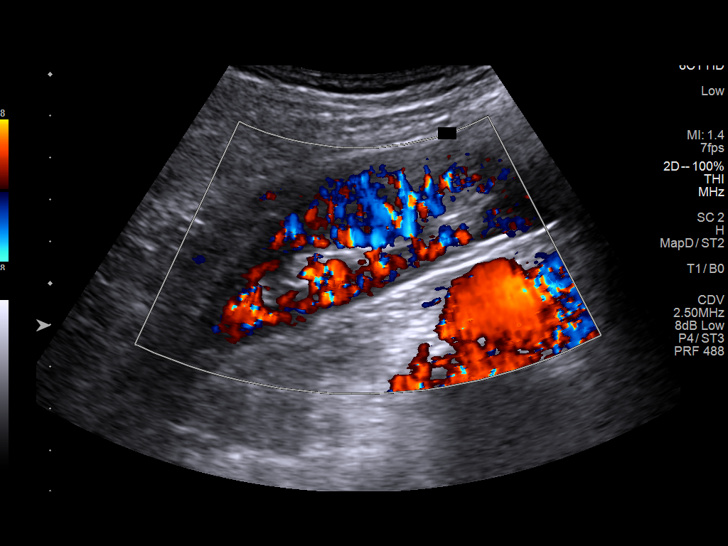
[im 12/45]
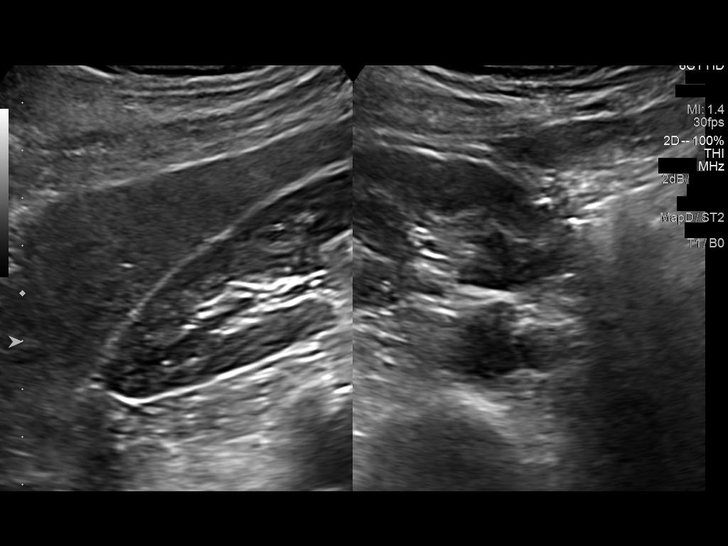
[im 15/45]
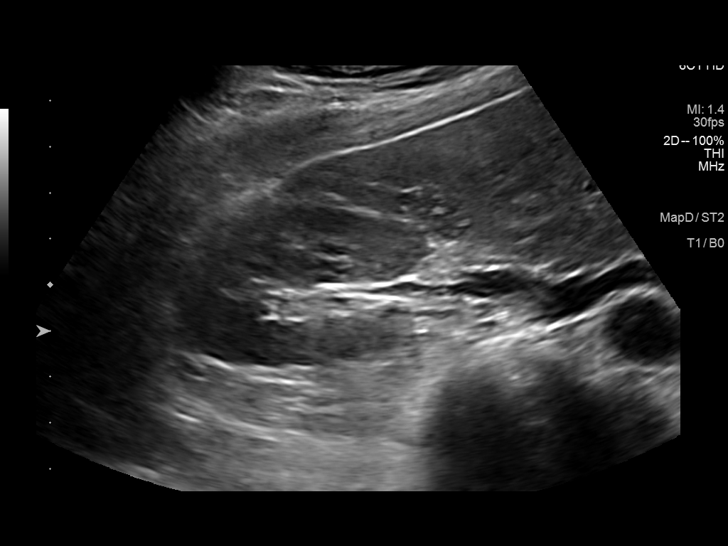
[im 17/45]
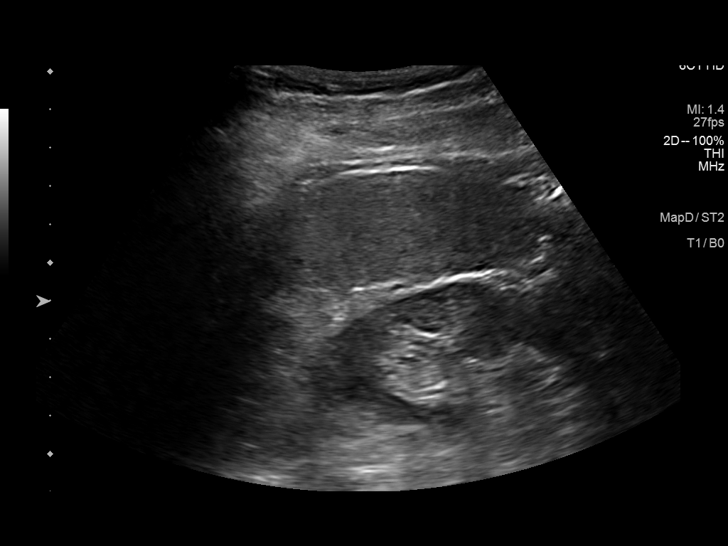
[im 21/45]
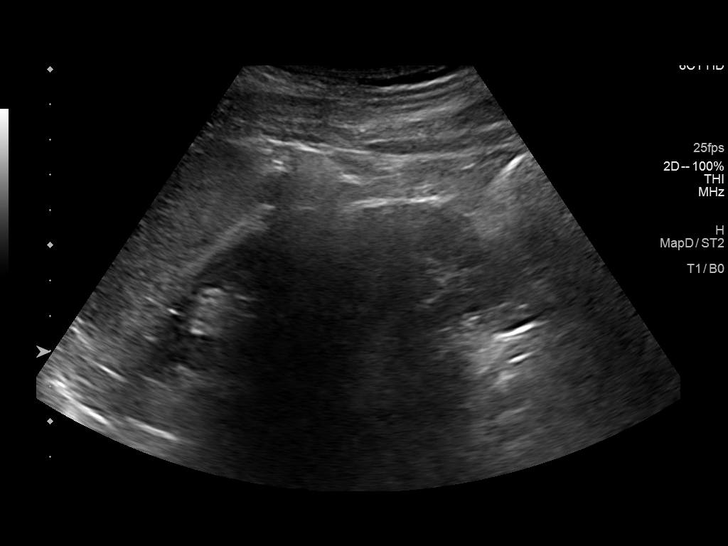
[im 24/45]
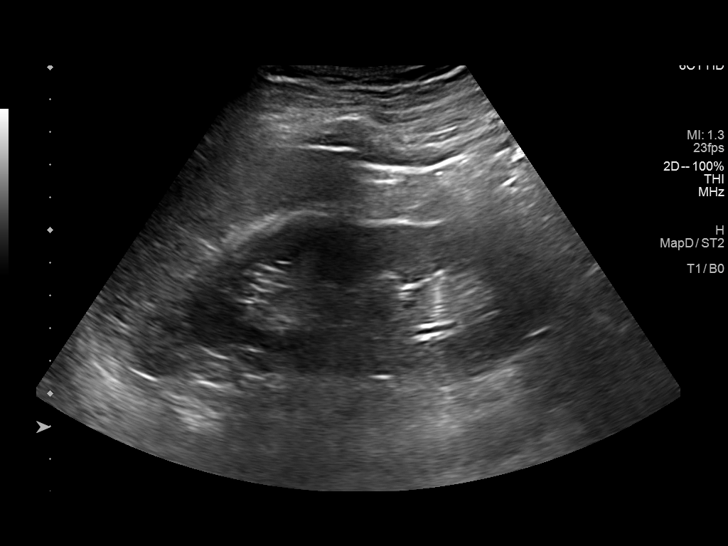
[im 28/45]
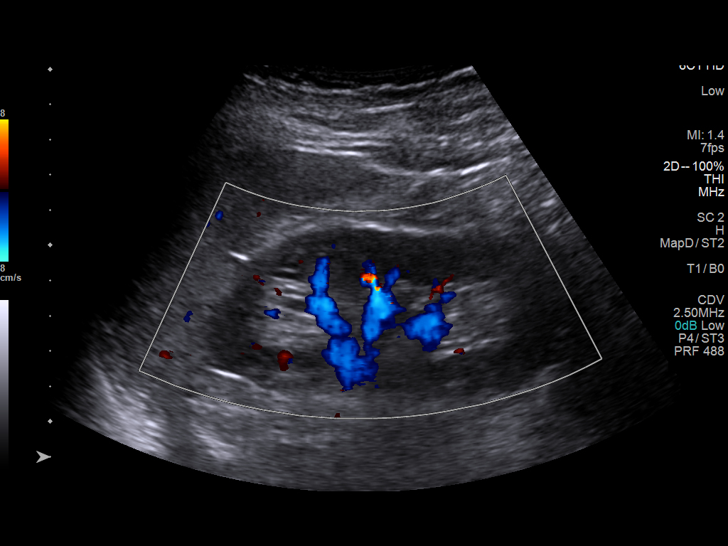
[im 30/45]
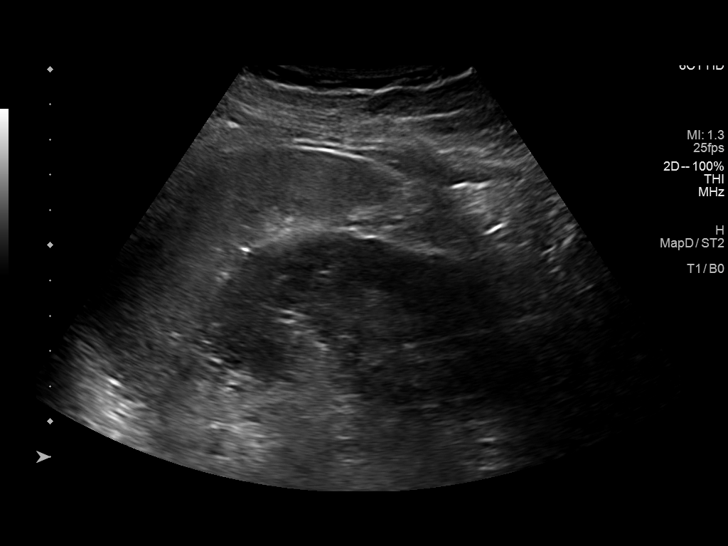
[im 34/45]
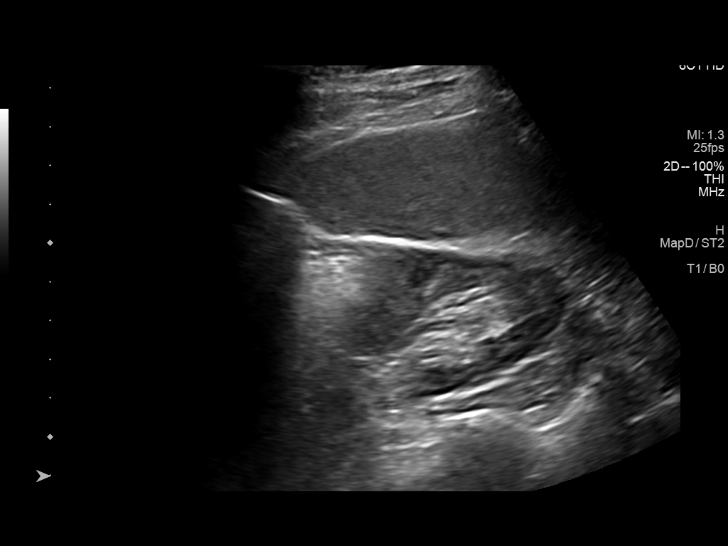
[im 37/45]
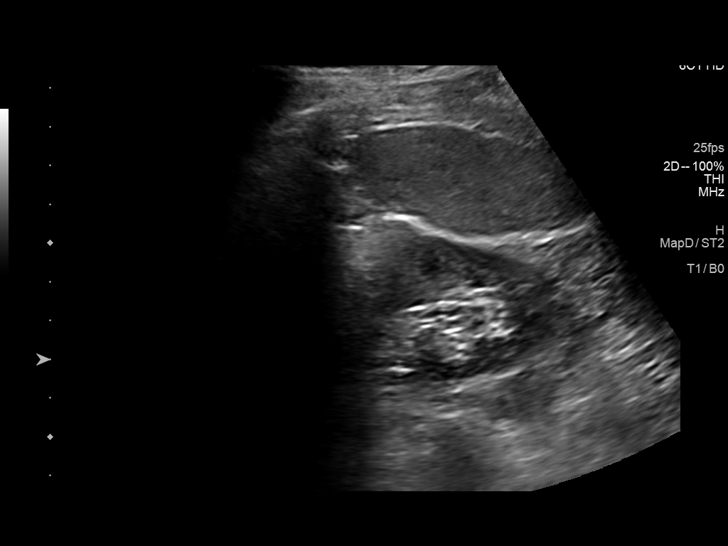
[im 41/45]
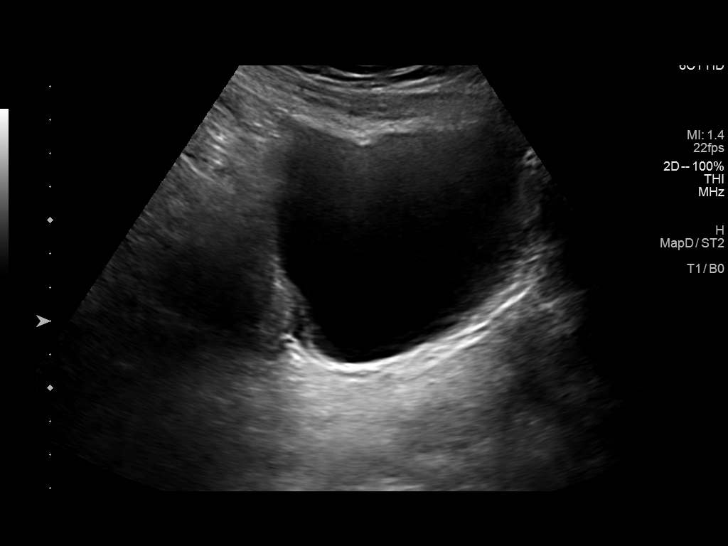
[im 45/45]
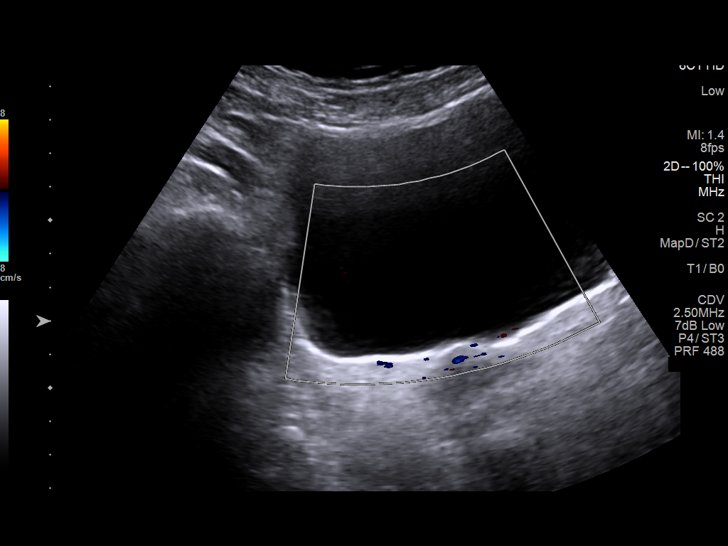

[14 of 25 positions shown; findings below may reference images not displayed]

FINDINGS: Right Kidney:

Renal measurements: 10.0 x 3.1 x 4.7 cm = volume: 76 mL. Renal
echogenicity within normal limits. No nephrolithiasis or
hydronephrosis. No focal renal mass.

Left Kidney:

Renal measurements: 11.4 x 4.6 x 4.3 cm = volume: 118 mL. Renal
echogenicity within normal limits. No nephrolithiasis or
hydronephrosis. No focal renal mass.

Bladder:

Appears normal for degree of bladder distention.

Other:

None.
IMPRESSION: Normal renal ultrasound. Renal echogenicity within normal limits. No
hydronephrosis or other significant finding.

## 2022-01-10 ENCOUNTER — Encounter: Payer: Self-pay | Admitting: Internal Medicine

## 2022-01-15 ENCOUNTER — Other Ambulatory Visit: Payer: Self-pay | Admitting: Internal Medicine

## 2022-01-15 DIAGNOSIS — G8929 Other chronic pain: Secondary | ICD-10-CM

## 2022-01-20 ENCOUNTER — Other Ambulatory Visit: Payer: Self-pay | Admitting: Internal Medicine

## 2022-01-20 DIAGNOSIS — E782 Mixed hyperlipidemia: Secondary | ICD-10-CM

## 2022-03-12 ENCOUNTER — Encounter: Payer: Self-pay | Admitting: Internal Medicine

## 2022-03-17 ENCOUNTER — Other Ambulatory Visit: Payer: Self-pay | Admitting: Internal Medicine

## 2022-03-17 DIAGNOSIS — K2100 Gastro-esophageal reflux disease with esophagitis, without bleeding: Secondary | ICD-10-CM

## 2022-03-21 ENCOUNTER — Ambulatory Visit (INDEPENDENT_AMBULATORY_CARE_PROVIDER_SITE_OTHER): Payer: BC Managed Care – PPO | Admitting: Internal Medicine

## 2022-03-21 ENCOUNTER — Encounter: Payer: Self-pay | Admitting: Internal Medicine

## 2022-03-21 VITALS — BP 130/80 | HR 89 | Temp 97.7°F | Resp 17 | Ht 69.0 in | Wt 183.2 lb

## 2022-03-21 DIAGNOSIS — Z0001 Encounter for general adult medical examination with abnormal findings: Secondary | ICD-10-CM

## 2022-03-21 DIAGNOSIS — Z1389 Encounter for screening for other disorder: Secondary | ICD-10-CM | POA: Diagnosis not present

## 2022-03-21 DIAGNOSIS — Z13220 Encounter for screening for lipoid disorders: Secondary | ICD-10-CM | POA: Diagnosis not present

## 2022-03-21 DIAGNOSIS — R5383 Other fatigue: Secondary | ICD-10-CM

## 2022-03-21 DIAGNOSIS — Z131 Encounter for screening for diabetes mellitus: Secondary | ICD-10-CM

## 2022-03-21 DIAGNOSIS — Z79899 Other long term (current) drug therapy: Secondary | ICD-10-CM

## 2022-03-21 DIAGNOSIS — M545 Low back pain, unspecified: Secondary | ICD-10-CM

## 2022-03-21 DIAGNOSIS — E559 Vitamin D deficiency, unspecified: Secondary | ICD-10-CM | POA: Diagnosis not present

## 2022-03-21 DIAGNOSIS — R7309 Other abnormal glucose: Secondary | ICD-10-CM

## 2022-03-21 DIAGNOSIS — Z111 Encounter for screening for respiratory tuberculosis: Secondary | ICD-10-CM

## 2022-03-21 DIAGNOSIS — N1831 Chronic kidney disease, stage 3a: Secondary | ICD-10-CM

## 2022-03-21 DIAGNOSIS — Z1329 Encounter for screening for other suspected endocrine disorder: Secondary | ICD-10-CM | POA: Diagnosis not present

## 2022-03-21 DIAGNOSIS — Z1211 Encounter for screening for malignant neoplasm of colon: Secondary | ICD-10-CM

## 2022-03-21 DIAGNOSIS — R0989 Other specified symptoms and signs involving the circulatory and respiratory systems: Secondary | ICD-10-CM | POA: Diagnosis not present

## 2022-03-21 DIAGNOSIS — Z0000 Encounter for general adult medical examination without abnormal findings: Secondary | ICD-10-CM

## 2022-03-21 DIAGNOSIS — Z8249 Family history of ischemic heart disease and other diseases of the circulatory system: Secondary | ICD-10-CM

## 2022-03-21 DIAGNOSIS — K581 Irritable bowel syndrome with constipation: Secondary | ICD-10-CM

## 2022-03-21 DIAGNOSIS — Z23 Encounter for immunization: Secondary | ICD-10-CM | POA: Diagnosis not present

## 2022-03-21 DIAGNOSIS — Z136 Encounter for screening for cardiovascular disorders: Secondary | ICD-10-CM | POA: Diagnosis not present

## 2022-03-21 DIAGNOSIS — I700 Atherosclerosis of aorta: Secondary | ICD-10-CM

## 2022-03-21 DIAGNOSIS — Z130 Encounter for screening for diseases of the blood and blood-forming organs and certain disorders involving the immune mechanism: Secondary | ICD-10-CM

## 2022-03-21 DIAGNOSIS — K2100 Gastro-esophageal reflux disease with esophagitis, without bleeding: Secondary | ICD-10-CM

## 2022-03-21 DIAGNOSIS — E782 Mixed hyperlipidemia: Secondary | ICD-10-CM

## 2022-03-21 MED ORDER — PHENTERMINE HCL 37.5 MG PO TABS: ORAL_TABLET | ORAL | 1 refills | Status: DC

## 2022-03-21 MED ORDER — AZITHROMYCIN 250 MG PO TABS: ORAL_TABLET | ORAL | 1 refills | Status: DC

## 2022-03-21 MED ORDER — GABAPENTIN 100 MG PO CAPS: ORAL_CAPSULE | ORAL | 3 refills | Status: DC

## 2022-03-21 NOTE — Progress Notes (Signed)
Annual Screening/Preventative Visit & Comprehensive Evaluation &  Examination   Future Appointments  Date Time Provider Department  03/21/2022                             cpe  2:00 PM Unk Pinto, MD [facility]  03/27/2023                             cpe  2:00 PM Unk Pinto, MD [facility]         This very nice 56 y.o. single WF  with  labile HTN, HLD, IBS-C, GERD (hx Barrett's), Prediabetes  and Vitamin D Deficiency  presents for a Screening /Preventative Visit & comprehensive evaluation and management of multiple medical co-morbidities.           Labile HTN predates since 2004.   Patient's BP has been controlled at home and patient denies any cardiac symptoms as chest pain, palpitations, shortness of breath, dizziness or ankle swelling. Today's BP\  is at goal - 130/80 .  Patient has CKD3a (GFR 46) and is followed by Dr Raymondo Band .        Patient's hyperlipidemia is controlled with diet & Rosuvastatin. Patient denies myalgias or other medication SE's. Last lipids were not at goal :  Lab Results  Component Value Date   CHOL 208 (H) 09/26/2020   HDL 71 09/26/2020   LDLCALC 104 (H) 09/26/2020   TRIG 221 (H) 09/26/2020   CHOLHDL 2.9 09/26/2020         Patient his followed proactively for glucose intolerance and patient denies reactive hypoglycemic symptoms, visual blurring, diabetic polys or paresthesias. Last A1c was normal & at goal :  Lab Results  Component Value Date   HGBA1C 5.3 09/26/2020         Finally, patient has history of Vitamin D Deficiency a ("36" /2012)  and last Vitamin D was at goal :  Lab Results  Component Value Date   VD25OH 58 09/26/2020     Current Outpatient Medications  Medication Instructions   betamethasone  oint (VALISONE) 0.1 %  2 times daily   buPROPion XL 300 MG  Take  1 table t every Morning    cyclobenzaprine 10 MG tablet Take 1/2 to 1 tablet 3 x /day as needed for Muscle Spasm   DULoxetine  30 MG capsule  Take 1 capsule by mouth twice daily for chronic back pain.   estradiol  0.1 MG/GM vaginal crm Apply 1 gram topically vaginally 2 x a week at hs   FLONASE nasal spray 2 sprays  As needed   gabapentin  300 MG capsule Take 1 capsule 2 - 3 x /  day as needed for back pain    lidocaine  5 % ointment 1 Application, Topical, 4 times daily PRN   linaclotide (LINZESS) 145 MCG  Take  1 capsule  Daily for IBS - C   pantoprazole \ 40 MG tablet TAKE 1 TABLET 2 TIMES DAILY    phentermine  37.5 MG tablet TAKE 1 TABLET EVERY MORNING    rosuvastatin 20 MG tablet Take  1 tablet  Daily    topiramate  50 MG tablet Take  1 tablet  2 x /day  at Suppertime & Bedtime     traZODone  150 MG tablet Take 2 tablets  1 hour before bedtime as needed for sleep.  valACYclovir 500 MG tablet Take 1 tablet Daily prophylaxis for Cold Sores      Allergies  Allergen Reactions   Atenolol Other (See Comments)    Made patient feel like a "zombie"   Ambien [Zolpidem Tartrate]     Memory issues   Cholestatin     Hot flashes   Mirapex [Pramipexole Dihydrochloride]     Hot flashes   Requip [Ropinirole Hcl]     Hot flashes     Past Medical History:  Diagnosis Date   Allergy    SEASONAL   Anxiety    Back injury    Barrett's esophagus    Chest pain, unspecified    normal myoview 2000;  echo 5/10:  mild LVH, EF 60-65%   DDD (degenerative disc disease)    Depression    GERD (gastroesophageal reflux disease)    Hiatal hernia    HSV-1 (herpes simplex virus 1) infection    Hyperlipidemia    IBS (irritable bowel syndrome)    Insomnia    Lumbago    Melanosis coli    Palpitations    h/o symptomatic PVCs   Palpitations    Persistent disorder of initiating or maintaining sleep    Restless legs syndrome (RLS)      Health Maintenance  Topic Date Due   Pneumococcal Vaccine 78-59 Years old (1 - PCV) 10/27/1970   COVID-19 Vaccine (4 - Booster for Pfizer series) 06/01/2020   Zoster Vaccines- Shingrix (2 of 2)  06/13/2020   INFLUENZA VACCINE  12/25/2020   MAMMOGRAM  04/05/2021   PAP SMEAR-Modifier  06/03/2021   TETANUS/TDAP  04/08/2022   COLONOSCOPY  04/15/2024   Hepatitis C Screening  Completed   HIV Screening  Completed   HPV VACCINES  Aged Out     Immunization History  Administered Date(s) Administered   Influenza Inj Mdck Quad  04/15/2019   Influenza  04/15/2013, 04/27/2014   Influenza, Seasonal 04/23/2016   Influenza,inj,Quad  04/06/2020   Influenza- 03/27/2012   PFIZER-SARS-COV-2 Vacc 08/13/2019, 09/03/2019, 04/06/2020   PPD Test 11/14/2017, 12/24/2018, 03/15/2020   Pneumococcal- 23 05/27/2009   Td 05/27/2001   Tdap 04/08/2012   Zoster Recombinat (Shingrix) 04/18/2020    Last Colon - 04/15/2014 - Dr Fuller Plan - Negative - Recc 10 yr f/u - due Nov 2025  Last EGD - 12/10/2017 - Dr Fuller Plan - Negative Bx's - Recc f/u 3 yrs - Over due - patient aware   Last MGM - 06/15/2021    Past Surgical History:  Procedure Laterality Date   COLONOSCOPY  2013   ESOPHAGOGASTRODUODENOSCOPY N/A 03/04/2014   EGD);  Ladene Artist, MD;     EYE SURGERY  2001   REFRACTIVE SURGERY     bilateral eyes    TUBES IN EARS     as an adult   UPPER GASTROINTESTINAL ENDOSCOPY       Family History  Problem Relation Age of Onset   Heart disease Cousin    Lung cancer Cousin    Hypertension Mother    Osteoporosis Mother    Breast cancer Sister 73       BRCA NEG/BILAT MASTECTOMY/CHEMO X 1 YR   Hyperlipidemia Sister    Ovarian cancer Sister    Colon cancer Maternal Grandmother    Stroke Maternal Grandmother    Osteoporosis Maternal Grandmother    Colon cancer Maternal Grandfather    Multiple myeloma Paternal Grandmother    Hypertension Father    Hyperlipidemia Father    Esophageal cancer Neg  Hx    Liver cancer Neg Hx    Pancreatic cancer Neg Hx    Rectal cancer Neg Hx    Stomach cancer Neg Hx      Social History   Tobacco Use   Smoking status: Never   Smokeless tobacco: Never  Vaping Use    Vaping Use: Never used  Substance Use Topics   Alcohol use: Yes    Alcohol/week: 1.0 standard drink    Types: 1 Standard drinks or equivalent per week    Comment: Social   Drug use: No      ROS Constitutional: Denies fever, chills, weight loss/gain, headaches, insomnia,  night sweats, and change in appetite. Does c/o fatigue. Eyes: Denies redness, blurred vision, diplopia, discharge, itchy, watery eyes.  ENT: Denies discharge, congestion, post nasal drip, epistaxis, sore throat, earache, hearing loss, dental pain, Tinnitus, Vertigo, Sinus pain, snoring.  Cardio: Denies chest pain, palpitations, irregular heartbeat, syncope, dyspnea, diaphoresis, orthopnea, PND, claudication, edema Respiratory: denies cough, dyspnea, DOE, pleurisy, hoarseness, laryngitis, wheezing.  Gastrointestinal: Denies dysphagia, heartburn, reflux, water brash, pain, cramps, nausea, vomiting, bloating, diarrhea, constipation, hematemesis, melena, hematochezia, jaundice, hemorrhoids Genitourinary: Denies dysuria, frequency, urgency, nocturia, hesitancy, discharge, hematuria, flank pain Breast: Breast lumps, nipple discharge, bleeding.  Musculoskeletal: Denies arthralgia, myalgia, stiffness, Jt. Swelling, pain, limp, and strain/sprain. Denies falls. Skin: Denies puritis, rash, hives, warts, acne, eczema, changing in skin lesion Neuro: No weakness, tremor, incoordination, spasms, paresthesia, pain Psychiatric: Denies confusion, memory loss, sensory loss. Denies Depression. Endocrine: Denies change in weight, skin, hair change, nocturia, and paresthesia, diabetic polys, visual blurring, hyper / hypo glycemic episodes.  Heme/Lymph: No excessive bleeding, bruising, enlarged lymph nodes.  Physical Exam  BP 130/80   Pulse 89   Temp 97.7 F (36.5 C)   Resp 17   Ht 5' 9"  (1.753 m)   Wt 183 lb 3.2 oz (83.1 kg)   LMP 12/26/2015   SpO2 99%   BMI 27.05 kg/m   General Appearance: Well nourished, well groomed and in no  apparent distress.  Eyes: PERRLA, EOMs, conjunctiva no swelling or erythema, normal fundi and vessels. Sinuses: No frontal/maxillary tenderness ENT/Mouth: EACs patent / TMs  nl. Nares clear without erythema, swelling, mucoid exudates. Oral hygiene is good. No erythema, swelling, or exudate. Tongue normal, non-obstructing. Tonsils not swollen or erythematous. Hearing normal.  Neck: Supple, thyroid not palpable. No bruits, nodes or JVD. Respiratory: Respiratory effort normal.  BS equal and clear bilateral without rales, rhonci, wheezing or stridor. Cardio: Heart sounds are normal with regular rate and rhythm and no murmurs, rubs or gallops. Peripheral pulses are normal and equal bilaterally without edema. No aortic or femoral bruits. Chest: symmetric with normal excursions and percussion. Breasts: Symmetric, without lumps, nipple discharge, retractions, or fibrocystic changes.  Abdomen: Flat, soft with bowel sounds active. Nontender, no guarding, rebound, hernias, masses, or organomegaly.  Lymphatics: Non tender without lymphadenopathy.  Genitourinary:  Musculoskeletal: Full ROM all peripheral extremities, joint stability, 5/5 strength, and normal gait. Skin: Warm and dry without rashes, lesions, cyanosis, clubbing or  ecchymosis.  Neuro: Cranial nerves intact, reflexes equal bilaterally. Normal muscle tone, no cerebellar symptoms. Sensation intact.  Pysch: Alert and oriented X 3, normal affect, Insight and Judgment appropriate.    Assessment and Plan  1. Annual Preventative Screening Examination   2. Labile hypertension  - EKG 12-Lead - [place], RETROPERITNL ABD,  LTD - Urinalysis, Routine w reflex microscopic - Microalbumin / creatinine urine ratio - CBC with Differential/Platelet - COMPLETE METABOLIC PANEL WITH  GFR - Magnesium - TSH  3. Hyperlipidemia, mixed  - EKG 12-Lead - [place], RETROPERITNL ABD,  LTD - Lipid panel - TSH  4. Abnormal glucose  - EKG 12-Lead - [place], RETROPERITNL  ABD,  LTD - Hemoglobin A1c - Insulin, random  5. Vitamin D deficiency  - VITAMIN D 25 Hydroxy   6. Chronic midline low back pain without sciatica   7. Gastroesophageal reflux disease   - CBC with Differential/Platelet  8. Stage 3a chronic kidney disease (CKD) (HCC)  - Urinalysis, Routine w reflex microscopic - Microalbumin / creatinine urine ratio - PTH, intact and calcium  9. Irritable bowel syndrome with constipation   10. Screening examination for pulmonary tuberculosis  - TB Skin Test  11. Screening for colorectal cancer  - POC Hemoccult Bld/Stl   12. Screening for heart disease  - EKG 12-Lead  13. FHx: heart disease  - [place], RETROPERITNL ABD,  LTD  14. Fatigue  - Iron, Total/Total Iron Binding Cap - Vitamin B12 - CBC with Differential/Platelet - TSH  15. Medication management  - Urinalysis, Routine w reflex microscopic - Microalbumin / creatinine urine ratio - CBC with Differential/Platelet - COMPLETE METABOLIC PANEL WITH GFR - Magnesium - Lipid panel - TSH - Hemoglobin A1c - Insulin, random - VITAMIN D 25 Hydroxy           Patient was counseled in prudent diet to achieve/maintain BMI less than 25 for weight control, BP monitoring, regular exercise and medications. Discussed med's effects and SE's. Screening labs and tests as requested with regular follow-up as recommended. Over 40 minutes of exam, counseling, chart review and high complex critical decision making was performed.   Kirtland Bouchard, MD

## 2022-03-21 NOTE — Patient Instructions (Signed)
Due to recent changes in healthcare laws, you may see the results of your imaging and laboratory studies on MyChart before your provider has had a chance to review them.  We understand that in some cases there may be results that are confusing or concerning to you. Not all laboratory results come back in the same time frame and the provider may be waiting for multiple results in order to interpret others.  Please give [place] 48 hours in order for your provider to thoroughly review all the results before contacting the office for clarification of your results.   ++++++++++++++++++++++++++++++  Vit D  & Vit C 1,000 mg   are recommended to help protect  against the Covid-19 and other Corona viruses.    Also it's recommended  to take  Zinc 50 mg  to help  protect against the Covid-19   and best place to get  is also on [email or web address]  and don't pay more than 6-8 cents /pill !  ================================ Coronavirus (COVID-19) Are you at risk?  Are you at risk for the Coronavirus (COVID-19)?  To be considered HIGH RISK for Coronavirus (COVID-19), you have to meet the following criteria:  Traveled to [place], [place], [place], [place] or [place]; or in the [place] to [place], [place], [place]  or [place]; and have fever, cough, and shortness of breath within the last 2 weeks of travel OR Been in close contact with a person diagnosed with COVID-19 within the last 2 weeks and have  fever, cough,and shortness of breath  IF YOU DO NOT MEET THESE CRITERIA, YOU ARE CONSIDERED LOW RISK FOR COVID-19.  What to do if you are HIGH RISK for COVID-19?  If you are having a medical emergency, call 911. Seek medical care right away. Before you go to a doctor's office, urgent care or emergency department,  call ahead and tell them about your recent travel, contact with someone diagnosed with COVID-19   and your symptoms.  You should receive instructions from your physician's office regarding  next steps of care.  When you arrive at healthcare provider, tell the healthcare staff immediately you have returned from  visiting [place], [place], [place], [place] or [place]; or traveled in the [place] to [place], [place],  [place] or [place] in the last two weeks or you have been in close contact with a person diagnosed with  COVID-19 in the last 2 weeks.   Tell the health care staff about your symptoms: fever, cough and shortness of breath. After you have been seen by a medical provider, you will be either: Tested for (COVID-19) and discharged home on quarantine except to seek medical care if  symptoms worsen, and asked to  Stay home and avoid contact with others until you get your results (4-5 days)  Avoid travel on public transportation if possible (such as bus, train, or airplane) or Sent to the Emergency Department by EMS for evaluation, COVID-19 testing  and  possible admission depending on your condition and test results.  What to do if you are LOW RISK for COVID-19?  Reduce your risk of any infection by using the same precautions used for avoiding the common cold or flu:  Wash your hands often with soap and warm water for at least 20 seconds.  If soap and water are not readily available,  use an alcohol-based hand sanitizer with at least 60% alcohol.  If coughing or sneezing, cover your mouth and nose by coughing  or sneezing into the elbow areas of your shirt or coat,  into a tissue or into your sleeve (not your hands). Avoid shaking hands with others and consider head nods or verbal greetings only. Avoid touching your eyes, nose, or mouth with unwashed hands.  Avoid close contact with people who are sick. Avoid places or events with large numbers of people in one location, like concerts or sporting events. Carefully consider travel plans you have or are making. If you are planning any travel outside or inside the [place], visit the CDC's Travelers' Health webpage for  the latest health notices. If you have some symptoms but not all symptoms, continue to monitor at home and seek medical attention  if your symptoms worsen. If you are having a medical emergency, call 911. >>>>>>>>>>>>>>>>>>>>>>> Preventive Care for Adults  A healthy lifestyle and preventive care can promote health and wellness. Preventive health guidelines for women include the following key practices. A routine yearly physical is a good way to check with your health care provider about your health and preventive screening. It is a chance to share any concerns and updates on your health and to receive a thorough exam. Visit your dentist for a routine exam and preventive care every 6 months. Brush your teeth twice a day and floss once a day. Good oral hygiene prevents tooth decay and gum disease. The frequency of eye exams is based on your age, health, family medical history, use of contact lenses, and other factors. Follow your health care provider's recommendations for frequency of eye exams. Eat a healthy diet. Foods like vegetables, fruits, whole grains, low-fat dairy products, and lean protein foods contain the nutrients you need without too many calories. Decrease your intake of foods high in solid fats, added sugars, and salt. Eat the right amount of calories for you. Get information about a proper diet from your health care provider, if necessary. Regular physical exercise is one of the most important things you can do for your health. Most adults should get at least 150 minutes of moderate-intensity exercise (any activity that increases your heart rate and causes you to sweat) each week. In addition, most adults need muscle-strengthening exercises on 2 or more days a week. Maintain a healthy weight. The body mass index (BMI) is a screening tool to identify possible weight problems. It provides an estimate of body fat based on height and weight. Your health care provider can find your BMI and can  help you achieve or maintain a healthy weight. For adults 20 years and older: A BMI below 18.5 is considered underweight. A BMI of 18.5 to 24.9 is normal. A BMI of 25 to 29.9 is considered overweight. A BMI of 30 and above is considered obese. Maintain normal blood lipids and cholesterol levels by exercising and minimizing your intake of saturated fat. Eat a balanced diet with plenty of fruit and vegetables. Blood tests for lipids and cholesterol should begin at age 43 and be repeated every 5 years. If your lipid or cholesterol levels are high, you are over 50, or you are at high risk for heart disease, you may need your cholesterol levels checked more frequently. Ongoing high lipid and cholesterol levels should be treated with medicines if diet and exercise are not working. If you smoke, find out from your health care provider how to quit. If you do not use tobacco, do not start. Lung cancer screening is recommended for adults aged 11-80 years who are at high risk for developing  lung cancer because of a history of smoking. A yearly low-dose CT scan of the lungs is recommended for people who have at least a 30-pack-year history of smoking and are a current smoker or have quit within the past 15 years. A pack year of smoking is smoking an average of 1 pack of cigarettes a day for 1 year (for example: 1 pack a day for 30 years or 2 packs a day for 15 years). Yearly screening should continue until the smoker has stopped smoking for at least 15 years. Yearly screening should be stopped for people who develop a health problem that would prevent them from having lung cancer treatment. High blood pressure causes heart disease and increases the risk of stroke. Your blood pressure should be checked at least every 1 to 2 years. Ongoing high blood pressure should be treated with medicines if weight loss and exercise do not work. If you are 59-73 years old, ask your health care provider if you should take aspirin to  prevent strokes. Diabetes screening involves taking a blood sample to check your fasting blood sugar level. This should be done once every 3 years, after age 66, if you are within normal weight and without risk factors for diabetes. Testing should be considered at a younger age or be carried out more frequently if you are overweight and have at least 1 risk factor for diabetes. Breast cancer screening is essential preventive care for women. You should practice "breast self-awareness." This means understanding the normal appearance and feel of your breasts and may include breast self-examination. Any changes detected, no matter how small, should be reported to a health care provider. Women in their 96s and 30s should have a clinical breast exam (CBE) by a health care provider as part of a regular health exam every 1 to 3 years. After age 28, women should have a CBE every year. Starting at age 88, women should consider having a mammogram (breast X-ray test) every year. Women who have a family history of breast cancer should talk to their health care provider about genetic screening. Women at a high risk of breast cancer should talk to their health care providers about having an MRI and a mammogram every year. Breast cancer gene (BRCA)-related cancer risk assessment is recommended for women who have family members with BRCA-related cancers. BRCA-related cancers include breast, ovarian, tubal, and peritoneal cancers. Having family members with these cancers may be associated with an increased risk for harmful changes (mutations) in the breast cancer genes BRCA1 and BRCA2. Results of the assessment will determine the need for genetic counseling and BRCA1 and BRCA2 testing. Routine pelvic exams to screen for cancer are no longer recommended for nonpregnant women who are considered low risk for cancer of the pelvic organs (ovaries, uterus, and vagina) and who do not have symptoms. Ask your health care provider if a  screening pelvic exam is right for you. If you have had past treatment for cervical cancer or a condition that could lead to cancer, you need Pap tests and screening for cancer for at least 20 years after your treatment. If Pap tests have been discontinued, your risk factors (such as having a new sexual partner) need to be reassessed to determine if screening should be resumed. Some women have medical problems that increase the chance of getting cervical cancer. In these cases, your health care provider may recommend more frequent screening and Pap tests. Colorectal cancer can be detected and often prevented. Most routine colorectal  cancer screening begins at the age of 10 years and continues through age 19 years. However, your health care provider may recommend screening at an earlier age if you have risk factors for colon cancer. On a yearly basis, your health care provider may provide home test kits to check for hidden blood in the stool. Use of a small camera at the end of a tube, to directly examine the colon (sigmoidoscopy or colonoscopy), can detect the earliest forms of colorectal cancer. Talk to your health care provider about this at age 28, when routine screening begins.  Direct exam of the colon should be repeated every 5-10 years through age 71 years, unless early forms of pre-cancerous polyps or small growths are found. Hepatitis C blood testing is recommended for all people born from [birth year] through 1965 and any individual with known risks for hepatitis C.  Osteoporosis is a disease in which the bones lose minerals and strength with aging. This can result in serious bone fractures or breaks. The risk of osteoporosis can be identified using a bone density scan. Women ages 21 years and over and women at risk for fractures or osteoporosis should discuss screening with their health care providers. Ask your health care provider whether you should take a calcium supplement or vitamin D to reduce the rate  of osteoporosis. Menopause can be associated with physical symptoms and risks. Hormone replacement therapy is available to decrease symptoms and risks. You should talk to your health care provider about whether hormone replacement therapy is right for you. Use sunscreen. Apply sunscreen liberally and repeatedly throughout the day. You should seek shade when your shadow is shorter than you. Protect yourself by wearing long sleeves, pants, a wide-brimmed hat, and sunglasses year round, whenever you are outdoors. Once a month, do a whole body skin exam, using a mirror to look at the skin on your back. Tell your health care provider of new moles, moles that have irregular borders, moles that are larger than a pencil eraser, or moles that have changed in shape or color. Stay current with required vaccines (immunizations). Influenza vaccine. All adults should be immunized every year. Tetanus, diphtheria, and acellular pertussis (Td, Tdap) vaccine. Pregnant women should receive 1 dose of Tdap vaccine during each pregnancy. The dose should be obtained regardless of the length of time since the last dose. Immunization is preferred during the 27th-36th week of gestation. An adult who has not previously received Tdap or who does not know her vaccine status should receive 1 dose of Tdap. This initial dose should be followed by tetanus and diphtheria toxoids (Td) booster doses every 10 years. Adults with an unknown or incomplete history of completing a 3-dose immunization series with Td-containing vaccines should begin or complete a primary immunization series including a Tdap dose. Adults should receive a Td booster every 10 years. Varicella vaccine. An adult without evidence of immunity to varicella should receive 2 doses or a second dose if she has previously received 1 dose. Pregnant females who do not have evidence of immunity should receive the first dose after pregnancy. This first dose should be obtained before  leaving the health care facility. The second dose should be obtained 4-8 weeks after the first dose. Human papillomavirus (HPV) vaccine. Females aged 13-26 years who have not received the vaccine previously should obtain the 3-dose series. The vaccine is not recommended for use in pregnant females. However, pregnancy testing is not needed before receiving a dose. If a female is found to  be pregnant after receiving a dose, no treatment is needed. In that case, the remaining doses should be delayed until after the pregnancy. Immunization is recommended for any person with an immunocompromised condition through the age of 55 years if she did not get any or all doses earlier. During the 3-dose series, the second dose should be obtained 4-8 weeks after the first dose. The third dose should be obtained 24 weeks after the first dose and 16 weeks after the second dose. Zoster vaccine. One dose is recommended for adults aged 15 years or older unless certain conditions are present. Measles, mumps, and rubella (MMR) vaccine. Adults born before 20 generally are considered immune to measles and mumps. Adults born in [birth year] or later should have 1 or more doses of MMR vaccine unless there is a contraindication to the vaccine or there is laboratory evidence of immunity to each of the three diseases. A routine second dose of MMR vaccine should be obtained at least 28 days after the first dose for students attending postsecondary schools, health care workers, or international travelers. People who received inactivated measles vaccine or an unknown type of measles vaccine during 1963-1967 should receive 2 doses of MMR vaccine. People who received inactivated mumps vaccine or an unknown type of mumps vaccine before 1979 and are at high risk for mumps infection should consider immunization with 2 doses of MMR vaccine. For females of childbearing age, rubella immunity should be determined. If there is no evidence of immunity, females  who are not pregnant should be vaccinated. If there is no evidence of immunity, females who are pregnant should delay immunization until after pregnancy. Unvaccinated health care workers born before [birth year] who lack laboratory evidence of measles, mumps, or rubella immunity or laboratory confirmation of disease should consider measles and mumps immunization with 2 doses of MMR vaccine or rubella immunization with 1 dose of MMR vaccine. Pneumococcal 13-valent conjugate (PCV13) vaccine. When indicated, a person who is uncertain of her immunization history and has no record of immunization should receive the PCV13 vaccine. An adult aged 4 years or older who has certain medical conditions and has not been previously immunized should receive 1 dose of PCV13 vaccine. This PCV13 should be followed with a dose of pneumococcal polysaccharide (PPSV23) vaccine. The PPSV23 vaccine dose should be obtained at least 1 or more year(s) after the dose of PCV13 vaccine. An adult aged 15 years or older who has certain medical conditions and previously received 1 or more doses of PPSV23 vaccine should receive 1 dose of PCV13. The PCV13 vaccine dose should be obtained 1 or more years after the last PPSV23 vaccine dose.  Pneumococcal polysaccharide (PPSV23) vaccine. When PCV13 is also indicated, PCV13 should be obtained first. All adults aged 9 years and older should be immunized. An adult younger than age 14 years who has certain medical conditions should be immunized. Any person who resides in a nursing home or long-term care facility should be immunized. An adult smoker should be immunized. People with an immunocompromised condition and certain other conditions should receive both PCV13 and PPSV23 vaccines. People with human immunodeficiency virus (HIV) infection should be immunized as soon as possible after diagnosis. Immunization during chemotherapy or radiation therapy should be avoided. Routine use of PPSV23 vaccine is not  recommended for American Indians, [place] Natives, or people younger than 65 years unless there are medical conditions that require PPSV23 vaccine. When indicated, people who have unknown immunization and have no record of immunization should receive  PPSV23 vaccine. One-time revaccination 5 years after the first dose of PPSV23 is recommended for people aged 19-64 years who have chronic kidney failure, nephrotic syndrome, asplenia, or immunocompromised conditions. People who received 1-2 doses of PPSV23 before age 36 years should receive another dose of PPSV23 vaccine at age 52 years or later if at least 5 years have passed since the previous dose. Doses of PPSV23 are not needed for people immunized with PPSV23 at or after age 34 years.  Preventive Services / Frequency  Ages 74 to 1 years Blood pressure check. Lipid and cholesterol check. Lung cancer screening. / Every year if you are aged 53-80 years and have a 30-pack-year history of smoking and currently smoke or have quit within the past 15 years. Yearly screening is stopped once you have quit smoking for at least 15 years or develop a health problem that would prevent you from having lung cancer treatment. Clinical breast exam.** / Every year after age 16 years.  BRCA-related cancer risk assessment.** / For women who have family members with a BRCA-related cancer (breast, ovarian, tubal, or peritoneal cancers). Mammogram.** / Every year beginning at age 26 years and continuing for as long as you are in good health. Consult with your health care provider. Pap test.** / Every 3 years starting at age 19 years through age 52 or 29 years with a history of 3 consecutive normal Pap tests. HPV screening.** / Every 3 years from ages 18 years through ages 4 to 110 years with a history of 3 consecutive normal Pap tests. Fecal occult blood test (FOBT) of stool. / Every year beginning at age 70 years and continuing until age 53 years. You may not need to do this  test if you get a colonoscopy every 10 years. Flexible sigmoidoscopy or colonoscopy.** / Every 5 years for a flexible sigmoidoscopy or every 10 years for a colonoscopy beginning at age 66 years and continuing until age 44 years. Hepatitis C blood test.** / For all people born from [birth year] through 1966 and any individual with known risks for hepatitis C. Skin self-exam. / Monthly. Influenza vaccine. / Every year. Tetanus, diphtheria, and acellular pertussis (Tdap/Td) vaccine.** / Consult your health care provider. Pregnant women should receive 1 dose of Tdap vaccine during each pregnancy. 1 dose of Td every 10 years. Varicella vaccine.** / Consult your health care provider. Pregnant females who do not have evidence of immunity should receive the first dose after pregnancy. Zoster vaccine.** / 1 dose for adults aged 90 years or older. Pneumococcal 13-valent conjugate (PCV13) vaccine.** / Consult your health care provider. Pneumococcal polysaccharide (PPSV23) vaccine.** / 1 to 2 doses if you smoke cigarettes or if you have certain conditions. Meningococcal vaccine.** / Consult your health care provider. Hepatitis A vaccine.** / Consult your health care provider. Hepatitis B vaccine.** / Consult your health care provider. Screening for abdominal aortic aneurysm (AAA)  by ultrasound is recommended for people over 50 who have history of high blood pressure or who are current or former smokers. ++++++++++++++++++ Recommend Adult Low Dose Aspirin or  coated  Aspirin 81 mg daily  To reduce risk of Colon Cancer 40 %,  Skin Cancer 26 % ,  Melanoma 46%  and  Pancreatic cancer 60% +++++++++++++++++++ Vitamin D goal  is between 70-100.  Please make sure that you are taking your Vitamin D as directed.  It is very important as a natural anti-inflammatory  helping hair, skin, and nails, as well as reducing stroke and heart  attack risk.  It helps your bones and helps with mood. It also decreases numerous  cancer risks so please take it as directed.  Low Vit D is associated with a 200-300% higher risk for CANCER  and 200-300% higher risk for HEART   ATTACK  &  STROKE.   .....................................Marland Kitchen It is also associated with higher death rate at younger ages,  autoimmune diseases like Rheumatoid arthritis, Lupus, Multiple Sclerosis.    Also many other serious conditions, like depression, Alzheimer's Dementia, infertility, muscle aches, fatigue, fibromyalgia - just to name a few. ++++++++++++++++++ Recommend the book "The END of DIETING" by Dr Excell Seltzer  & the book "The END of DIABETES " by Dr Excell Seltzer At [email or web address] - get book & Audio CD's    Being diabetic has a  300% increased risk for heart attack, stroke, cancer, and alzheimer- type vascular dementia. It is very important that you work harder with diet by avoiding all foods that are white. Avoid white rice (brown & wild rice is OK), white potatoes (sweetpotatoes in moderation is OK), White bread or wheat bread or anything made out of white flour like bagels, donuts, rolls, buns, biscuits, cakes, pastries, cookies, pizza crust, and pasta (made from white flour & egg whites) - vegetarian pasta or spinach or wheat pasta is OK. Multigrain breads like Arnold's or [employer], or multigrain sandwich thins or flatbreads.  Diet, exercise and weight loss can reverse and cure diabetes in the early stages.  Diet, exercise and weight loss is very important in the control and prevention of complications of diabetes which affects every system in your body, ie. Brain - dementia/stroke, eyes - glaucoma/blindness, heart - heart attack/heart failure, kidneys - dialysis, stomach - gastric paralysis, intestines - malabsorption, nerves - severe painful neuritis, circulation - gangrene & loss of a leg(s), and finally cancer and Alzheimers.    I recommend avoid fried & greasy foods,  sweets/candy, white rice (brown or wild rice or Quinoa is OK), white  potatoes (sweet potatoes are OK) - anything made from white flour - bagels, doughnuts, rolls, buns, biscuits,white and wheat breads, pizza crust and traditional pasta made of white flour & egg white(vegetarian pasta or spinach or wheat pasta is OK).  Multi-grain bread is OK - like multi-grain flat bread or sandwich thins. Avoid alcohol in excess. Exercise is also important.    Eat all the vegetables you want - avoid meat, especially red meat and dairy - especially cheese.  Cheese is the most concentrated form of trans-fats which is the worst thing to clog up our arteries. Veggie cheese is OK which can be found in the fresh produce section at [employer] or [employer] or [facility]  ++++++++++++++++++++++ DASH Eating Plan  DASH stands for "Dietary Approaches to Stop Hypertension."   The DASH eating plan is a healthy eating plan that has been shown to reduce high blood pressure (hypertension). Additional health benefits may include reducing the risk of type 2 diabetes mellitus, heart disease, and stroke. The DASH eating plan may also help with weight loss. WHAT DO I NEED TO KNOW ABOUT THE DASH EATING PLAN? For the DASH eating plan, you will follow these general guidelines: Choose foods with a percent daily value for sodium of less than 5% (as listed on the food label). Use salt-free seasonings or herbs instead of table salt or sea salt. Check with your health care provider or pharmacist before using salt substitutes. Eat lower-sodium products, often labeled as "lower sodium" or "no  salt added." Eat fresh foods. Eat more vegetables, fruits, and low-fat dairy products. Choose whole grains. Look for the word "whole" as the first word in the ingredient list. Choose fish  Limit sweets, desserts, sugars, and sugary drinks. Choose heart-healthy fats. Eat veggie cheese  Eat more home-cooked food and less restaurant, buffet, and fast food. Limit fried foods. Cook foods using methods other than  frying. Limit canned vegetables. If you do use them, rinse them well to decrease the sodium. When eating at a restaurant, ask that your food be prepared with less salt, or no salt if possible.                      WHAT FOODS CAN I EAT? Read Dr Fara Olden Fuhrman's books on The End of Dieting & The End of Diabetes  Grains Whole grain or whole wheat bread. Brown rice. Whole grain or whole wheat pasta. Quinoa, bulgur, and whole grain cereals. Low-sodium cereals. Corn or whole wheat flour tortillas. Whole grain cornbread. Whole grain crackers. Low-sodium crackers.  Vegetables Fresh or frozen vegetables (raw, steamed, roasted, or grilled). Low-sodium or reduced-sodium tomato and vegetable juices. Low-sodium or reduced-sodium tomato sauce and paste. Low-sodium or reduced-sodium canned vegetables.   Fruits All fresh, canned (in natural juice), or frozen fruits.  Protein Products  All fish and seafood.  Dried beans, peas, or lentils. Unsalted nuts and seeds. Unsalted canned beans.  Dairy Low-fat dairy products, such as skim or 1% milk, 2% or reduced-fat cheeses, low-fat ricotta or cottage cheese, or plain low-fat yogurt. Low-sodium or reduced-sodium cheeses.  Fats and Oils Tub margarines without trans fats. Light or reduced-fat mayonnaise and salad dressings (reduced sodium). Avocado. Safflower, olive, or canola oils. Natural peanut or almond butter.  Other Unsalted popcorn and pretzels. The items listed above may not be a complete list of recommended foods or beverages. Contact your dietitian for more options.  ++++++++++++++++++  WHAT FOODS ARE NOT RECOMMENDED? Grains/ White flour or wheat flour White bread. White pasta. White rice. Refined cornbread. Bagels and croissants. Crackers that contain trans fat.  Vegetables  Creamed or fried vegetables. Vegetables in a . Regular canned vegetables. Regular canned tomato sauce and paste. Regular tomato and vegetable juices.  Fruits Dried fruits.  Canned fruit in light or heavy syrup. Fruit juice.  Meat and Other Protein Products Meat in general - RED meat & White meat.  Fatty cuts of meat. Ribs, chicken wings, all processed meats as bacon, sausage, bologna, salami, fatback, hot dogs, bratwurst and packaged luncheon meats.  Dairy Whole or 2% milk, cream, half-and-half, and cream cheese. Whole-fat or sweetened yogurt. Full-fat cheeses or blue cheese. Non-dairy creamers and whipped toppings. Processed cheese, cheese spreads, or cheese curds.  Condiments Onion and garlic salt, seasoned salt, table salt, and sea salt. Canned and packaged gravies. Worcestershire sauce. Tartar sauce. Barbecue sauce. Teriyaki sauce. Soy sauce, including reduced sodium. Steak sauce. Fish sauce. Oyster sauce. Cocktail sauce. Horseradish. Ketchup and mustard. Meat flavorings and tenderizers. Bouillon cubes. Hot sauce. Tabasco sauce. Marinades. Taco seasonings. Relishes.  Fats and Oils Butter, stick margarine, lard, shortening and bacon fat. Coconut, palm kernel, or palm oils. Regular salad dressings.  Pickles and olives. Salted popcorn and pretzels.  The items listed above may not be a complete list of foods and beverages to avoid.

## 2022-03-22 ENCOUNTER — Other Ambulatory Visit: Payer: Self-pay | Admitting: Internal Medicine

## 2022-03-22 DIAGNOSIS — R300 Dysuria: Secondary | ICD-10-CM

## 2022-03-22 DIAGNOSIS — R8281 Pyuria: Secondary | ICD-10-CM

## 2022-03-22 LAB — MICROALBUMIN / CREATININE URINE RATIO
Creatinine, Urine: 34 mg/dL (ref 20–275)
Microalb, Ur: <0.2 mg/dL

## 2022-03-22 LAB — URINALYSIS, ROUTINE W REFLEX MICROSCOPIC
Bacteria, UA: NONE SEEN /HPF
Bilirubin Urine: NEGATIVE
Glucose, UA: NEGATIVE
Hgb urine dipstick: NEGATIVE
Hyaline Cast: NONE SEEN /LPF
Ketones, ur: NEGATIVE
Nitrite: NEGATIVE
Protein, ur: NEGATIVE
RBC / HPF: NONE SEEN /HPF (ref 0–2)
Specific Gravity, Urine: 1.004 (ref 1.001–1.035)
Squamous Epithelial / HPF: NONE SEEN /HPF (ref <=5)
pH: <=5 (ref 5.0–8.0)

## 2022-03-22 LAB — CBC WITH DIFFERENTIAL/PLATELET
Absolute Monocytes: 414 cells/uL (ref 200–950)
Basophils Absolute: 69 cells/uL (ref 0–200)
Basophils Relative: 1 %
Eosinophils Absolute: 200 cells/uL (ref 15–500)
Eosinophils Relative: 2.9 %
HCT: 39.5 % (ref 35.0–45.0)
Hemoglobin: 13.1 g/dL (ref 11.7–15.5)
Lymphs Abs: 2512 cells/uL (ref 850–3900)
MCH: 29.7 pg (ref 27.0–33.0)
MCHC: 33.2 g/dL (ref 32.0–36.0)
MCV: 89.6 fL (ref 80.0–100.0)
MPV: 10.7 fL (ref 7.5–12.5)
Monocytes Relative: 6 %
Neutro Abs: 3705 cells/uL (ref 1500–7800)
Neutrophils Relative %: 53.7 %
Platelets: 304 10*3/uL (ref 140–400)
RBC: 4.41 10*6/uL (ref 3.80–5.10)
RDW: 12.4 % (ref 11.0–15.0)
Total Lymphocyte: 36.4 %
WBC: 6.9 10*3/uL (ref 3.8–10.8)

## 2022-03-22 LAB — PTH, INTACT AND CALCIUM
Calcium: 9.4 mg/dL (ref 8.6–10.4)
PTH: 49 pg/mL (ref 16–77)

## 2022-03-22 LAB — IRON, TOTAL/TOTAL IRON BINDING CAP
%SAT: 17 % (calc) (ref 16–45)
Iron: 60 ug/dL (ref 45–160)
TIBC: 344 mcg/dL (calc) (ref 250–450)

## 2022-03-22 LAB — LIPID PANEL
Cholesterol: 238 mg/dL — ABNORMAL HIGH (ref <200)
HDL: 53 mg/dL (ref >=50)
Non-HDL Cholesterol (Calc): 185 mg/dL (calc) — ABNORMAL HIGH (ref <130)
Total CHOL/HDL Ratio: 4.5 (calc) (ref <5.0)
Triglycerides: 546 mg/dL — ABNORMAL HIGH (ref <150)

## 2022-03-22 LAB — HEMOGLOBIN A1C
Hgb A1c MFr Bld: 5.7 % of total Hgb — ABNORMAL HIGH (ref <5.7)
Mean Plasma Glucose: 117 mg/dL
eAG (mmol/L): 6.5 mmol/L

## 2022-03-22 LAB — COMPLETE METABOLIC PANEL WITH GFR
AG Ratio: 1.7 (calc) (ref 1.0–2.5)
ALT: 13 U/L (ref 6–29)
AST: 19 U/L (ref 10–35)
Albumin: 4.3 g/dL (ref 3.6–5.1)
Alkaline phosphatase (APISO): 89 U/L (ref 37–153)
BUN/Creatinine Ratio: 16 (calc) (ref 6–22)
BUN: 19 mg/dL (ref 7–25)
CO2: 31 mmol/L (ref 20–32)
Calcium: 9.4 mg/dL (ref 8.6–10.4)
Chloride: 103 mmol/L (ref 98–110)
Creat: 1.16 mg/dL — ABNORMAL HIGH (ref 0.50–1.03)
Globulin: 2.6 g/dL (calc) (ref 1.9–3.7)
Glucose, Bld: 90 mg/dL (ref 65–99)
Potassium: 4.4 mmol/L (ref 3.5–5.3)
Sodium: 140 mmol/L (ref 135–146)
Total Bilirubin: 0.2 mg/dL (ref 0.2–1.2)
Total Protein: 6.9 g/dL (ref 6.1–8.1)
eGFR: 55 mL/min/{1.73_m2} — ABNORMAL LOW (ref >=60)

## 2022-03-22 LAB — VITAMIN B12: Vitamin B-12: 1855 pg/mL — ABNORMAL HIGH (ref 200–1100)

## 2022-03-22 LAB — MICROSCOPIC MESSAGE

## 2022-03-22 LAB — MAGNESIUM: Magnesium: 2.1 mg/dL (ref 1.5–2.5)

## 2022-03-22 LAB — INSULIN, RANDOM: Insulin: 14.8 u[IU]/mL

## 2022-03-22 LAB — TSH: TSH: 2.09 mIU/L (ref 0.40–4.50)

## 2022-03-22 LAB — VITAMIN D 25 HYDROXY (VIT D DEFICIENCY, FRACTURES): Vit D, 25-Hydroxy: 84 ng/mL (ref 30–100)

## 2022-03-22 NOTE — Progress Notes (Signed)
<><><><><><><><><><><><><><><><><><><><><><><><><><><><><><><><><> <><><><><><><><><><><><><><><><><><><><><><><><><><><><><><><><><> - Test results slightly outside the reference range are not unusual. If there is anything important, I will review this with you,  otherwise it is considered normal test values.  If you have further questions,  please do not hesitate to contact me at the office or via My Chart.  <><><><><><><><><><><><><><><><><><><><><><><><><><><><><><><><><> <><><><><><><><><><><><><><><><><><><><><><><><><><><><><><><><><>  -  U/A is suspicious for UTI  & culture was ordered <><><><><><><><><><><><><><><><><><><><><><><><><><><><><><><><><>  -  Kidney functions actually look better  /  GFR up from 46 to 55  - Great !  <><><><><><><><><><><><><><><><><><><><><><><><><><><><><><><><><>  -  Total Chol= 238   - Too High  !  !  !   -  Cholesterol is too high - Recommend low cholesterol diet   - Cholesterol only comes from animal sources                                                                                                                                                                                                                                                                                                             . meat, dairy, egg yolks  - Eat all the vegetables you want.  - Avoid Meat, Avoid Meat,  Avoid Meat                                                                    - especially Red Meat - Beef AND Pork .  - Avoid cheese & dairy - milk & ice cream.     - Cheese is the most concentrated form of trans-fats which  is the worst thing to clog up our arteries.   - Veggie cheese is OK which can be found in the fresh  produce section at [employer] or [employer] or [facility]  - Please be sure taking your Rosuvastatin  / Crestor &    you need a much  better diet or you'll pay the price.  <><><><><><><><><><><><><><><><><><><><><><><><><><><><><><><><><>  -  Iron level is Normal   - Vitamin B12 level is very high , So you may cut back to 1 x / week.  <><><><><><><><><><><><><><><><><><><><><><><><><><><><><><><><><>  -  A1c = 5.7 %   -  Borderline -    - Avoid Sweets, Candy & White Stuff   - White Rice, White Sorrel, White Flour  - Breads &  Pasta <><><><><><><><><><><><><><><><><><><><><><><><><><><><><><><><><>  -  PTH  is a hormone  that regulates calcium balance &  is Normal  <><><><><><><><><><><><><><><><><><><><><><><><><><><><><><><><><>  -  Vitamin D = 84 - Excellent =- Please continue dose same  <><><><><><><><><><><><><><><><><><><><><><><><><><><><><><><><><> <><><><><><><><><><><><><><><><><><><><><><><><><><><><><><><><><>  -  All Else - CBC -  Electrolytes - Liver - Magnesium & Thyroid    - all  Normal / OK  <><><><><><><><><><><><><><><><><><><><><><><><><><><><><><><><><> <><><><><><><><><><><><><><><><><><><><><><><><><><><><><><><><><>                            \ \

## 2022-03-23 ENCOUNTER — Other Ambulatory Visit: Payer: Self-pay | Admitting: Internal Medicine

## 2022-03-23 DIAGNOSIS — N94819 Vulvodynia, unspecified: Secondary | ICD-10-CM

## 2022-03-26 ENCOUNTER — Other Ambulatory Visit: Payer: Self-pay | Admitting: Internal Medicine

## 2022-03-26 DIAGNOSIS — N390 Urinary tract infection, site not specified: Secondary | ICD-10-CM

## 2022-03-26 LAB — URINE CULTURE
MICRO NUMBER:: 14111759
SPECIMEN QUALITY:: ADEQUATE

## 2022-03-26 MED ORDER — NITROFURANTOIN MONOHYD MACRO 100 MG PO CAPS: ORAL_CAPSULE | ORAL | 0 refills | Status: DC

## 2022-03-26 NOTE — Progress Notes (Signed)
<><><><><><><><><><><><><><><><><><><><><><><><><><><><><><><><><> <><><><><><><><><><><><><><><><><><><><><><><><><><><><><><><><><>  -   Urine culture did return (+) positive, for E.coli bacterial UTI  - Sending Antibiotic to your Drug store.   - Please also schedule a Nurse Visit in about a month                                                         to recheck urine to assure infection all cleared up.   <><><><><><><><><><><><><><><><><><><><><><><><><><><><><><><><><> <><><><><><><><><><><><><><><><><><><><><><><><><><><><><><><><><>

## 2022-04-04 ENCOUNTER — Other Ambulatory Visit: Payer: Self-pay | Admitting: Internal Medicine

## 2022-04-04 ENCOUNTER — Encounter: Payer: Self-pay | Admitting: Internal Medicine

## 2022-04-04 DIAGNOSIS — B9620 Unspecified Escherichia coli [E. coli] as the cause of diseases classified elsewhere: Secondary | ICD-10-CM

## 2022-04-04 DIAGNOSIS — N390 Urinary tract infection, site not specified: Secondary | ICD-10-CM

## 2022-04-04 MED ORDER — NITROFURANTOIN MONOHYD MACRO 100 MG PO CAPS: ORAL_CAPSULE | ORAL | 0 refills | Status: DC

## 2022-04-16 ENCOUNTER — Other Ambulatory Visit: Payer: Self-pay | Admitting: Internal Medicine

## 2022-04-24 DIAGNOSIS — M5136 Other intervertebral disc degeneration, lumbar region: Secondary | ICD-10-CM | POA: Diagnosis not present

## 2022-05-10 DIAGNOSIS — M5136 Other intervertebral disc degeneration, lumbar region: Secondary | ICD-10-CM | POA: Diagnosis not present

## 2022-05-16 ENCOUNTER — Other Ambulatory Visit: Payer: Self-pay | Admitting: Internal Medicine

## 2022-05-16 DIAGNOSIS — K2100 Gastro-esophageal reflux disease with esophagitis, without bleeding: Secondary | ICD-10-CM

## 2022-05-22 DIAGNOSIS — M5136 Other intervertebral disc degeneration, lumbar region: Secondary | ICD-10-CM | POA: Diagnosis not present

## 2022-05-30 ENCOUNTER — Other Ambulatory Visit: Payer: Self-pay | Admitting: Internal Medicine

## 2022-05-30 DIAGNOSIS — E782 Mixed hyperlipidemia: Secondary | ICD-10-CM

## 2022-05-31 DIAGNOSIS — M5136 Other intervertebral disc degeneration, lumbar region: Secondary | ICD-10-CM | POA: Diagnosis not present

## 2022-06-16 ENCOUNTER — Other Ambulatory Visit: Payer: Self-pay | Admitting: Internal Medicine

## 2022-06-16 DIAGNOSIS — B9620 Unspecified Escherichia coli [E. coli] as the cause of diseases classified elsewhere: Secondary | ICD-10-CM

## 2022-06-17 DIAGNOSIS — Z1231 Encounter for screening mammogram for malignant neoplasm of breast: Secondary | ICD-10-CM | POA: Diagnosis not present

## 2022-06-17 LAB — HM MAMMOGRAPHY

## 2022-06-19 ENCOUNTER — Ambulatory Visit: Payer: BC Managed Care – PPO | Admitting: Nurse Practitioner

## 2022-06-19 ENCOUNTER — Encounter: Payer: Self-pay | Admitting: Nurse Practitioner

## 2022-06-19 VITALS — BP 124/82 | HR 91 | Temp 97.9°F | Wt 180.0 lb

## 2022-06-19 DIAGNOSIS — E559 Vitamin D deficiency, unspecified: Secondary | ICD-10-CM | POA: Diagnosis not present

## 2022-06-19 DIAGNOSIS — R0989 Other specified symptoms and signs involving the circulatory and respiratory systems: Secondary | ICD-10-CM

## 2022-06-19 DIAGNOSIS — N1831 Chronic kidney disease, stage 3a: Secondary | ICD-10-CM

## 2022-06-19 DIAGNOSIS — K2100 Gastro-esophageal reflux disease with esophagitis, without bleeding: Secondary | ICD-10-CM

## 2022-06-19 DIAGNOSIS — E782 Mixed hyperlipidemia: Secondary | ICD-10-CM

## 2022-06-19 DIAGNOSIS — R7309 Other abnormal glucose: Secondary | ICD-10-CM | POA: Diagnosis not present

## 2022-06-19 DIAGNOSIS — R232 Flushing: Secondary | ICD-10-CM

## 2022-06-19 DIAGNOSIS — F411 Generalized anxiety disorder: Secondary | ICD-10-CM

## 2022-06-19 DIAGNOSIS — K22719 Barrett's esophagus with dysplasia, unspecified: Secondary | ICD-10-CM

## 2022-06-19 DIAGNOSIS — N390 Urinary tract infection, site not specified: Secondary | ICD-10-CM

## 2022-06-19 DIAGNOSIS — Z79899 Other long term (current) drug therapy: Secondary | ICD-10-CM

## 2022-06-19 NOTE — Patient Instructions (Addendum)
Cranberry supplement  Interstitial Cystitis  Interstitial cystitis is inflammation of the bladder. This condition is also known as painful bladder syndrome. This may cause pain in the bladder area as well as a frequent and urgent need to urinate. The bladder is an organ that stores urine after the urine is made in the kidneys. The severity of interstitial cystitis can vary from person to person. You may have flare-ups, and then your symptoms may go away for a while. For many people, it becomes a long-term (chronic) problem. What are the causes? The cause of this condition is not known. What increases the risk? The following factors may make you more likely to develop this condition: Being female. Having fibromyalgia. Having irritable bowel syndrome (IBS). Having endometriosis. Having chronic fatigue syndrome. This condition may be aggravated by: Stress. Smoking. Spicy foods. What are the signs or symptoms? Symptoms of interstitial cystitis vary, and they can change over time. Symptoms may include: Discomfort or pain in the bladder area, which is in the lower abdomen. Pain can range from mild to severe. The pain may change in intensity as the bladder fills with urine or as it empties. Pain in the pelvic area, between the hip bones. A constant urge to urinate. Frequent urination. Pain during urination. Pain during sex. Blood in the urine. Feeling tired (fatigue). For women, symptoms often get worse during menstruation. How is this diagnosed? This condition is diagnosed based on your symptoms, your medical history, and a physical exam. Your health care provider may need to rule out other conditions and may order other tests, such as: Urine tests. Cystoscopy. For this test, a tool similar to a very thin telescope is used to look into your bladder. Biopsy. This involves taking a sample of tissue from the bladder to be examined under a microscope. How is this treated? There is no cure  for this condition, but treatment can help you control your symptoms. Work closely with your health care provider to find the most effective treatments for you. Treatment options may include: Medicines to relieve pain and reduce how often you feel the need to urinate. This treatment may include: A procedure where a small amount of medicine that eases irritation is put inside your bladder through a catheter (bladder instillation). Lifestyle changes, such as changing your diet or taking steps to control stress. Physical therapy. This may include: Exercises to help relax the pelvic floor muscles. Massage to relax tight muscles (myofascial release). Learning ways to control when you urinate (bladder training). Using a device that provides electrical stimulation to your nerves, which can relieve pain (neuromodulation therapy). The device is placed on your back, where it blocks the nerves that cause you to feel pain in your bladder area. A procedure that stretches your bladder by filling it with air or fluid (hydrodistention). Surgery. This is rare. It is only done for extreme cases, if other treatments do not help. Follow these instructions at home: Lifestyle Learn and practice relaxation techniques, such as deep breathing and muscle relaxation. Get care for your body and mental well-being, such as: Cognitive behavioral therapy (CBT). This therapy changes the way you think or act in response to different situations. This may improve how you feel. Seeing a mental health therapist to evaluate and treat depression, if necessary. Work with your health care provider on other ways to manage pain. Acupuncture may be helpful. Avoid drinking alcohol. Do not use any products that contain nicotine or tobacco. These products include cigarettes, chewing tobacco, and  vaping devices, such as e-cigarettes. If you need help quitting, ask your health care provider. Eating and drinking Make dietary changes as  recommended by your health care provider. You may need to avoid: Spicy foods. Foods that contain a lot of potassium. Limit your intake of drinks that increase your urge to urinate. These include alcohol and caffeinated drinks like soda, coffee, and tea. Bladder training  Use bladder training techniques as directed. Techniques may include: Urinating at scheduled times. Training yourself to delay urination. Keep a bladder diary. Write down the times you urinate and any symptoms that you have. This can help you find out which foods, liquids, or activities make your symptoms worse. Use your bladder diary to schedule bathroom trips. If you are away from home, plan to be near a bathroom at each of your scheduled times. Make sure that you urinate just before you leave the house and just before you go to bed. General instructions Take over-the-counter and prescription medicines only as told by your health care provider. Try a warm or cool compress over your bladder for comfort. Avoid wearing tight clothing. Do exercises to relax your pelvic floor muscles as told by your physical therapist. Keep all follow-up visits. This is important. Where to find more information To find more information or a support group near you, visit: [facility] [facility]: urologyhealth.org Interstitial Cystitis Association: [email or web address] Contact a health care provider if you have: Symptoms that do not get better with treatment. Pain or discomfort that gets worse. More frequent urges to urinate. A fever. Get help right away if: You have no control over when you urinate. Summary Interstitial cystitis is inflammation of the bladder. This condition may cause pain in the bladder area as well as a frequent and urgent need to urinate. You may have flare-ups of the condition, and then it may go away for a while. For many people, it becomes a long-term (chronic) problem. There is no cure for interstitial cystitis, but  treatment methods are available to control your symptoms. This information is not intended to replace advice given to you by your health care provider. Make sure you discuss any questions you have with your health care provider. Document Revised: 12/17/2019 Document Reviewed: 12/17/2019 Elsevier Patient Education  [employer] Tablets What is this medication? FEZOLINETANT (FEZ oh LIN e tant) reduces the number and severity of hot flashes due to menopause. It works by blocking substances in your body that cause hot flashes and night sweats. This medicine may be used for other purposes; ask your health care provider or pharmacist if you have questions. COMMON BRAND NAME(S): VEOZAH What should I tell my care team before I take this medication? They need to know if you have any of these conditions: Kidney disease Liver disease An unusual or allergic reaction to fezolinetant, other medications, foods, dyes, or preservatives Pregnant or trying to get pregnant Breastfeeding How should I use this medication? Take this medication by mouth with water. Take it as directed on the prescription label at the same time every day. Do not cut, crush, or chew this medication. Swallow the tablets whole. You can take it with or without food. If it upsets your stomach, take it with food. Keep taking it unless your care team tells you to stop. Talk to your care team about the use of this medication in children. Special care may be needed. Overdosage: If you think you have taken too much of this medicine contact a poison control  center or emergency room at once. NOTE: This medicine is only for you. Do not share this medicine with others. What if I miss a dose? If you miss a dose, take it as soon as you can unless it is more than 12 hours late. If it is more than 12 hours late, skip the missed dose. Take the next dose at the normal time. What may interact with this medication? Other  medications may affect the way this medication works. Talk with your care team about all of the medications you take. They may suggest changes to your treatment plan to lower the risk of side effects and to make sure your medications work as intended. This list may not describe all possible interactions. Give your health care provider a list of all the medicines, herbs, non-prescription drugs, or dietary supplements you use. Also tell them if you smoke, drink alcohol, or use illegal drugs. Some items may interact with your medicine. What should I watch for while using this medication? Visit your care team for regular checks on your progress. Tell your care team if your symptoms do not start to get better or if they get worse. You may need blood work while taking this medication. What side effects may I notice from receiving this medication? Side effects that you should report to your care team as soon as possible: Allergic reactions--skin rash, itching, hives, swelling of the face, lips, tongue, or throat Liver injury--right upper belly pain, loss of appetite, nausea, light-colored stool, dark yellow or brown urine, yellowing skin or eyes, unusual weakness or fatigue Side effects that usually do not require medical attention (report these to your care team if they continue or are bothersome): Back pain Diarrhea Hot flashes Stomach pain Trouble sleeping This list may not describe all possible side effects. Call your doctor for medical advice about side effects. You may report side effects to FDA at 1-800-FDA-1088. Where should I keep my medication? Keep out of the reach of children and pets. Store at room temperature between 20 and 25 degrees C (68 and 77 degrees F). Get rid of any unused medication after the expiration date. To get rid of medications that are no longer needed or have expired: Take the medication to a medication take-back program. Check with your pharmacy or law enforcement to  find a location. If you cannot return the medication, check the label or package insert to see if the medication should be thrown out in the garbage or flushed down the toilet. If you are not sure, ask your care team. If it is safe to put it in the trash, take the medication out of the container. Mix the medication with cat litter, dirt, coffee grounds, or other unwanted substance. Seal the mixture in a bag or container. Put it in the trash. NOTE: This sheet is a summary. It may not cover all possible information. If you have questions about this medicine, talk to your doctor, pharmacist, or health care provider.  2023 Elsevier/Gold Standard (2021-10-18 00:00:00)

## 2022-06-19 NOTE — Progress Notes (Unsigned)
FOLLOW UP  Assessment and Plan:   Labile hypertension Controlled Discussed DASH (Dietary Approaches to Stop Hypertension) DASH diet is lower in sodium than a typical American diet. Cut back on foods that are high in saturated fat, cholesterol, and trans fats. Eat more whole-grain foods, fish, poultry, and nuts Remain active and exercise as tolerated daily.  Monitor BP at home-Call if greater than 130/80.  Check CMP/CBC  - CBC with Differential/Platelet - COMPLETE METABOLIC PANEL WITH GFR  Hyperlipidemia, mixed Discussed lifestyle modifications. Recommended diet heavy in fruits and veggies, omega 3's. Decrease consumption of animal meats, cheeses, and dairy products. Remain active and exercise as tolerated. Continue to monitor. Check lipids/TSH  - Lipid panel  Vitamin D deficiency Suggested supplement. Monitor levels  - VITAMIN D 25 Hydroxy (Vit-D Deficiency, Fractures)  Abnormal glucose Education: Reviewed 'ABCs' of diabetes management  Discussed goals to be met and/or maintained include A1C (<7) Blood pressure (<130/80) Cholesterol (LDL <70) Continue Eye Exam yearly  Continue Dental Exam Q6 mo Discussed dietary recommendations Discussed Physical Activity recommendations Check A1C  - Hemoglobin A1c  Gastroesophageal reflux disease with esophagitis, unspecified whether hemorrhage/Barretts  Lifestyle modification:  wt loss, avoid meals 2-3h before bedtime. Consider eliminating food triggers:  chocolate, caffeine, EtOH, acid/spicy food. Keep an eye of lymph nodes, other presenting symptoms, nasal congestion, fever for increase s/s of viral/bacterial illness.    Stage 3a chronic kidney disease (CKD) ([place]) Discussed how what you eat and drink can aide in kidney protection. Stay well hydrated. Avoid high salt foods. Avoid NSAIDS. Keep BP and BG well controlled.   Take medications as prescribed. Remain active and exercise as tolerated daily. Maintain weight.   Continue to monitor. Check CMP/GFR/Microablumin  Urinary tract infection without hematuria, site unspecified Discussed daily antibiotic Continue daily probiotic Dysuria versus IS Continue to monitor  - Urinalysis, Routine w reflex microscopic - Urine Culture  Medication management All medications discussed and reviewed in full. All questions and concerns regarding medications addressed.    - CBC with Differential/Platelet - COMPLETE METABOLIC PANEL WITH GFR - Lipid panel - VITAMIN D 25 Hydroxy (Vit-D Deficiency, Fractures) - Urinalysis, Routine w reflex microscopic - Urine Culture - Hemoglobin A1c  Hot Flashes [place] samples provided Continue to monitor  Generalized anxiety Continue Wellbutrin Review relaxation techniques.  Sleep hygiene. Encouraged personality growth wand development through coping techniques and problem-solving skills. Limit/Decrease/Monitor drug/alcohol intake.     Orders Placed This Encounter  Procedures   Urine Culture   CBC with Differential/Platelet   COMPLETE METABOLIC PANEL WITH GFR   Lipid panel   VITAMIN D 25 Hydroxy (Vit-D Deficiency, Fractures)   Urinalysis, Routine w reflex microscopic   Hemoglobin A1c    Notify office for further evaluation and treatment, questions or concerns if any reported s/s fail to improve.   The patient was advised to call back or seek an in-person evaluation if any symptoms worsen or if the condition fails to improve as anticipated.   Further disposition pending results of labs. Discussed med's effects and SE's.    I discussed the assessment and treatment plan with the patient. The patient was provided an opportunity to ask questions and all were answered. The patient agreed with the plan and demonstrated an understanding of the instructions.  Discussed med's effects and SE's. Screening labs and tests as requested with regular follow-up as recommended.  I provided 30 minutes of face-to-face time during  this encounter including counseling, chart review, and critical decision making was preformed.  The patient was advised to call back or seek an in-person evaluation if any symptoms worsen or if the condition fails to improve as anticipated.   Further disposition pending results of labs. Discussed med's effects and SE's.    I discussed the assessment and treatment plan with the patient. The patient was provided an opportunity to ask questions and all were answered. The patient agreed with the plan and demonstrated an understanding of the instructions.  Discussed med's effects and SE's. Screening labs and tests as requested with regular follow-up as recommended.  Future Appointments  Date Time Provider [place]  10/01/2022  3:30 PM Unk Pinto, MD [facility] None  03/27/2023  2:00 PM Unk Pinto, MD [facility] None    ----------------------------------------------------------------------------------------------------------------------  HPI 57 y.o. female  presents for 3 month follow up on hypertension, cholesterol, diabetes, weight and vitamin D deficiency.   She is concerned about feeling hot/hot flashes.  Currently going thorough menopause.  Prescribed Estradiol vaginal cream, not using.  Has tried black cohosh in the past with limited benefit.    She is treated for anxiety, currently on Wellbutrin.  Doing well at this time.  Mood is stabilized.    Has Barrett's, continues to notice sore throat.  Unable to clear tell difference between feeling unwell from a cold or if sore throat is d/t reflux.  She continues Pantoprazole.   She is also concerned for persistent symptoms of dysuria, not feeling today but comes and goes.  Has been seen by Urology, has been told things are "clear/negative."  No cystoscopy.  Unsure of IS.  Thinks AZO and abx tmt works best for her symptoms.  Denies fever, chills, N/V, hematuria, abdominal, back pain.  BMI is Body mass index is 26.58  kg/m., she has been working on diet and exercise.  Taking Phentermine and Topamax. Wt Readings from Last 3 Encounters:  06/19/22 180 lb (81.6 kg)  03/21/22 183 lb 3.2 oz (83.1 kg)  06/19/21 169 lb (76.7 kg)    Her blood pressure has been controlled at home, today their BP is BP: 124/82  She does workout at this time, very busy with work, Engineer, petroleum. She denies chest pain, shortness of breath, dizziness.   She is on cholesterol medication Rosuvastatin and denies myalgias. Her cholesterol is not at goal. The cholesterol last visit was:   Lab Results  Component Value Date   CHOL 238 (H) 03/21/2022   HDL 53 03/21/2022   [place]  03/21/2022     Comment:     . LDL cholesterol not calculated. Triglyceride levels greater than 400 mg/dL invalidate calculated LDL results. . Reference range: <100 . Desirable range <100 mg/dL for primary prevention;   <70 mg/dL for patients with CHD or diabetic patients  with > or = 2 CHD risk factors. Marland Kitchen LDL-C is now calculated using the Martin-Hopkins  calculation, which is a validated novel method providing  better accuracy than the Friedewald equation in the  estimation of LDL-C.  Cresenciano Genre et al. Annamaria Helling. 6010;932(35): 2061-2068  (http://education.[facility].com/faq/FAQ164)    TRIG 546 (H) 03/21/2022   CHOLHDL 4.5 03/21/2022    She has been working on diet and exercise for prediabetes, and denies polydipsia and polyuria. Last A1C in the office was:  Lab Results  Component Value Date   HGBA1C 5.7 (H) 03/21/2022   Patient is on Vitamin D supplement.   Lab Results  Component Value Date   VD25OH 84 03/21/2022        Current Medications:  Current  Outpatient Medications on File Prior to Visit  Medication Sig   buPROPion (WELLBUTRIN XL) 300 MG 24 hr tablet Take  1 table t every Morning for Mood, Focus and Concentration                           /                                 TAKE                                BY                                 MOUTH   DULoxetine (CYMBALTA) 30 MG capsule Take 1 capsule by mouth twice daily for chronic back pain.   estradiol (ESTRACE) 0.1 MG/GM vaginal cream Apply 1 gram topically vaginally 2 x a week at hs   fluticasone (FLONASE) 50 MCG/ACT nasal spray Place 2 sprays into both nostrils as needed. Reported on 06/27/2015   gabapentin (NEURONTIN) 100 MG capsule Take 1 to 2  capsule(s)  2 - 3 x / day as needed for back pain   lidocaine (XYLOCAINE) 5 % ointment Apply 1 application topically four times daily as needed.   linaclotide (LINZESS) 145 MCG CAPS capsule Take  1 capsule  Daily for IBS - C   nitrofurantoin, macrocrystal-monohydrate, (MACROBID) 100 MG capsule Take  1 capsule  Daily   with Food for UTI Prevention   pantoprazole (PROTONIX) 40 MG tablet Take 1 tablet by mouth twice daily to prevent indigestion and heartburn.   phentermine (ADIPEX-P) 37.5 MG tablet TAKE 1 TABLET EVERY MORNING FOR DIETING & WEIGHT LOSS   rosuvastatin (CRESTOR) 20 MG tablet Take 1 tablet mouth daily for cholesterol.   topiramate (TOPAMAX) 50 MG tablet Take 1 tablet by mouth twice daily with supper and at bedtime.   traZODone (DESYREL) 150 MG tablet Take 2 tablets by mouth 1 hour before bedtime as needed for sleep. **Generic equivalent for Desyrel.**   valACYclovir (VALTREX) 500 MG tablet Take 1 tablet Daily prophylaxis for Cold Sores / Fever Blisters   azithromycin (ZITHROMAX) 250 MG tablet Take 2 tablets w/ Food on  Day 1, then 1 tablet Daily w/ Food for Infection   betamethasone valerate ointment (VALISONE) 0.1 % Apply 1 application topically 2 (two) times daily. (Patient not taking: Reported on 06/19/2022)   cyclobenzaprine (FLEXERIL) 10 MG tablet Take 1/2 to 1 tablet 3 x /day as needed for Muscle Spasm (Patient not taking: Reported on 06/19/2022)   No current facility-administered medications on file prior to visit.     Allergies:  Allergies  Allergen Reactions   Atenolol Other (See Comments)    Made  patient feel like a "zombie"   Ambien [Zolpidem Tartrate]     Memory issues   Mirapex [Pramipexole Dihydrochloride]     Hot flashes   Octacosanol     Hot flashes   Requip [Ropinirole Hcl]     Hot flashes     Medical History:  Past Medical History:  Diagnosis Date   Allergy    SEASONAL   Anxiety    Back injury    Barrett's esophagus    Chest pain, unspecified    normal myoview 2000;  echo 5/10:  mild LVH, EF 60-65%   DDD (degenerative disc disease)    Depression    GERD (gastroesophageal reflux disease)    Hiatal hernia    HSV-1 (herpes simplex virus 1) infection    Hyperlipidemia    IBS (irritable bowel syndrome)    Insomnia    Lumbago    Melanosis coli    Palpitations    h/o symptomatic PVCs   Palpitations    Persistent disorder of initiating or maintaining sleep    Restless legs syndrome (RLS)    Family history- Reviewed and unchanged Social history- Reviewed and unchanged   Review of Systems: A complete ROS was performed with pertinent positives/negatives noted in the HPI. The remainder of the ROS are negative.  Physical Exam: BP 124/82   Pulse 91   Temp 97.9 F (36.6 C)   Wt 180 lb (81.6 kg)   LMP 12/26/2015   SpO2 99%   BMI 26.58 kg/m  Wt Readings from Last 3 Encounters:  06/19/22 180 lb (81.6 kg)  03/21/22 183 lb 3.2 oz (83.1 kg)  06/19/21 169 lb (76.7 kg)   General Appearance: Well nourished, in no apparent distress. Eyes: PERRLA, EOMs, conjunctiva no swelling or erythema Sinuses: No Frontal/maxillary tenderness ENT/Mouth: Ext aud canals clear, TMs without erythema, bulging. No erythema, swelling, or exudate on post pharynx.  Tonsils not swollen or erythematous. Hearing normal.  Neck: Supple, thyroid normal.  Respiratory: Respiratory effort normal, BS equal bilaterally without rales, rhonchi, wheezing or stridor.  Cardio: RRR with no MRGs. Brisk peripheral pulses without edema.  Abdomen: Soft, + BS.  Non tender, no guarding, rebound, hernias,  masses. Lymphatics: Non tender without lymphadenopathy.  Musculoskeletal: Full ROM, 5/5 strength, Normal gait Skin: Warm, dry without rashes, lesions, ecchymosis.  Neuro: Cranial nerves intact. No cerebellar symptoms.  Psych: Awake and oriented X 3, normal affect, Insight and Judgment appropriate.    Darrol Jump, NP 4:15 PM [facility]

## 2022-06-20 LAB — URINE CULTURE
MICRO NUMBER:: 14468442
Result:: NO GROWTH
SPECIMEN QUALITY:: ADEQUATE

## 2022-06-20 LAB — HEMOGLOBIN A1C
Hgb A1c MFr Bld: 5.8 % of total Hgb — ABNORMAL HIGH (ref <5.7)
Mean Plasma Glucose: 120 mg/dL
eAG (mmol/L): 6.6 mmol/L

## 2022-06-20 LAB — COMPLETE METABOLIC PANEL WITH GFR
AG Ratio: 1.7 (calc) (ref 1.0–2.5)
ALT: 14 U/L (ref 6–29)
AST: 17 U/L (ref 10–35)
Albumin: 4.3 g/dL (ref 3.6–5.1)
Alkaline phosphatase (APISO): 94 U/L (ref 37–153)
BUN: 16 mg/dL (ref 7–25)
CO2: 28 mmol/L (ref 20–32)
Calcium: 9.6 mg/dL (ref 8.6–10.4)
Chloride: 102 mmol/L (ref 98–110)
Creat: 0.95 mg/dL (ref 0.50–1.03)
Globulin: 2.5 g/dL (calc) (ref 1.9–3.7)
Glucose, Bld: 97 mg/dL (ref 65–99)
Potassium: 4.3 mmol/L (ref 3.5–5.3)
Sodium: 139 mmol/L (ref 135–146)
Total Bilirubin: 0.2 mg/dL (ref 0.2–1.2)
Total Protein: 6.8 g/dL (ref 6.1–8.1)
eGFR: 70 mL/min/{1.73_m2} (ref >=60)

## 2022-06-20 LAB — URINALYSIS, ROUTINE W REFLEX MICROSCOPIC
Bilirubin Urine: NEGATIVE
Glucose, UA: NEGATIVE
Hgb urine dipstick: NEGATIVE
Ketones, ur: NEGATIVE
Leukocytes,Ua: NEGATIVE
Nitrite: NEGATIVE
Protein, ur: NEGATIVE
Specific Gravity, Urine: 1.005 (ref 1.001–1.035)
pH: 5.5 (ref 5.0–8.0)

## 2022-06-20 LAB — CBC WITH DIFFERENTIAL/PLATELET
Absolute Monocytes: 421 cells/uL (ref 200–950)
Basophils Absolute: 73 cells/uL (ref 0–200)
Basophils Relative: 0.9 %
Eosinophils Absolute: 162 cells/uL (ref 15–500)
Eosinophils Relative: 2 %
HCT: 38.9 % (ref 35.0–45.0)
Hemoglobin: 13.4 g/dL (ref 11.7–15.5)
Lymphs Abs: 2349 cells/uL (ref 850–3900)
MCH: 30.7 pg (ref 27.0–33.0)
MCHC: 34.4 g/dL (ref 32.0–36.0)
MCV: 89 fL (ref 80.0–100.0)
MPV: 10.4 fL (ref 7.5–12.5)
Monocytes Relative: 5.2 %
Neutro Abs: 5095 cells/uL (ref 1500–7800)
Neutrophils Relative %: 62.9 %
Platelets: 327 10*3/uL (ref 140–400)
RBC: 4.37 10*6/uL (ref 3.80–5.10)
RDW: 12.3 % (ref 11.0–15.0)
Total Lymphocyte: 29 %
WBC: 8.1 10*3/uL (ref 3.8–10.8)

## 2022-06-20 LAB — LIPID PANEL
Cholesterol: 210 mg/dL — ABNORMAL HIGH (ref <200)
HDL: 52 mg/dL (ref >=50)
Non-HDL Cholesterol (Calc): 158 mg/dL (calc) — ABNORMAL HIGH (ref <130)
Total CHOL/HDL Ratio: 4 (calc) (ref <5.0)
Triglycerides: 484 mg/dL — ABNORMAL HIGH (ref <150)

## 2022-06-20 LAB — VITAMIN D 25 HYDROXY (VIT D DEFICIENCY, FRACTURES): Vit D, 25-Hydroxy: 90 ng/mL (ref 30–100)

## 2022-06-21 ENCOUNTER — Encounter: Payer: Self-pay | Admitting: Nurse Practitioner

## 2022-06-21 DIAGNOSIS — N390 Urinary tract infection, site not specified: Secondary | ICD-10-CM

## 2022-06-21 MED ORDER — NITROFURANTOIN MONOHYD MACRO 100 MG PO CAPS: ORAL_CAPSULE | ORAL | 0 refills | Status: DC

## 2022-06-24 ENCOUNTER — Encounter: Payer: Self-pay | Admitting: Nurse Practitioner

## 2022-06-25 ENCOUNTER — Other Ambulatory Visit: Payer: Self-pay

## 2022-06-25 ENCOUNTER — Other Ambulatory Visit: Payer: Self-pay | Admitting: Nurse Practitioner

## 2022-06-25 DIAGNOSIS — K581 Irritable bowel syndrome with constipation: Secondary | ICD-10-CM

## 2022-06-25 DIAGNOSIS — B9620 Unspecified Escherichia coli [E. coli] as the cause of diseases classified elsewhere: Secondary | ICD-10-CM

## 2022-06-25 MED ORDER — LINACLOTIDE 145 MCG PO CAPS: ORAL_CAPSULE | ORAL | 3 refills | Status: DC

## 2022-06-25 MED ORDER — PHENTERMINE HCL 37.5 MG PO TABS: ORAL_TABLET | ORAL | 1 refills | Status: DC

## 2022-06-25 MED ORDER — NITROFURANTOIN MONOHYD MACRO 100 MG PO CAPS: ORAL_CAPSULE | ORAL | 0 refills | Status: DC

## 2022-07-14 ENCOUNTER — Other Ambulatory Visit: Payer: Self-pay | Admitting: Nurse Practitioner

## 2022-07-14 DIAGNOSIS — G4700 Insomnia, unspecified: Secondary | ICD-10-CM

## 2022-08-05 ENCOUNTER — Encounter: Payer: Self-pay | Admitting: Internal Medicine

## 2022-08-13 DIAGNOSIS — M5412 Radiculopathy, cervical region: Secondary | ICD-10-CM | POA: Diagnosis not present

## 2022-08-13 DIAGNOSIS — M5416 Radiculopathy, lumbar region: Secondary | ICD-10-CM | POA: Diagnosis not present

## 2022-08-15 ENCOUNTER — Other Ambulatory Visit: Payer: Self-pay | Admitting: Internal Medicine

## 2022-08-15 DIAGNOSIS — N390 Urinary tract infection, site not specified: Secondary | ICD-10-CM

## 2022-08-28 ENCOUNTER — Other Ambulatory Visit: Payer: Self-pay | Admitting: Nurse Practitioner

## 2022-08-28 DIAGNOSIS — M545 Low back pain, unspecified: Secondary | ICD-10-CM

## 2022-09-04 DIAGNOSIS — M5416 Radiculopathy, lumbar region: Secondary | ICD-10-CM | POA: Diagnosis not present

## 2022-09-22 ENCOUNTER — Other Ambulatory Visit: Payer: Self-pay | Admitting: Internal Medicine

## 2022-09-26 DIAGNOSIS — M5412 Radiculopathy, cervical region: Secondary | ICD-10-CM | POA: Diagnosis not present

## 2022-10-01 ENCOUNTER — Encounter: Payer: Self-pay | Admitting: Internal Medicine

## 2022-10-01 ENCOUNTER — Ambulatory Visit: Payer: BC Managed Care – PPO | Admitting: Internal Medicine

## 2022-10-01 NOTE — Progress Notes (Signed)
C  A  N  C  E  L  L  E  D  Future Appointments  Date Time Provider Department  10/01/2022  3:30 PM Lucky Cowboy, MD [facility]  10/22/2022  2:00 PM Romualdo Bolk, MD [facility]  03/27/2023  2:00 PM Lucky Cowboy, MD [facility]    History of Present Illness:       This very nice 57 y.o.female presents for 6  month follow up with HTN, CKD3a, HLD, Pre-Diabetes and Vitamin D Deficiency.         Patient is treated for HTN  since  & BP has been controlled at home. Today's  . Patient has had no complaints of any cardiac type chest pain, palpitations, dyspnea / orthopnea / PND, dizziness, claudication, or dependent edema.        Hyperlipidemia is not controlled with diet & meds. Patient denies myalgias or other med SE's. Last Lipids  were  Lab Results  Component Value Date   CHOL 210 (H) 06/19/2022   HDL 52 06/19/2022   LDLCALC not calculated 06/19/2022   TRIG 484 (H) 06/19/2022   CHOLHDL 4.0 06/19/2022      Also, the patient has history of PreDiabetes and has had no symptoms of reactive hypoglycemia, diabetic polys, paresthesias or visual blurring.  Last A1c was not at goal :  Lab Results  Component Value Date   HGBA1C 5.8 (H) 06/19/2022         Further, the patient also has history of Vitamin D Deficiency and supplements vitamin D . Last vitamin D was  Lab Results  Component Value Date   VD25OH 90 06/19/2022      Current Outpatient Medications on File Prior to Visit  Medication Sig   buPROPion (WELLBUTRIN XL) 300 MG 24 hr tablet Take  1 table t every Morning for Mood, Focus and Concentration                           /                                 TAKE                                BY                                MOUTH   DULoxetine (CYMBALTA) 30 MG capsule Take 1 capsule by mouth twice daily for chronic back pain.   estradiol (ESTRACE) 0.1 MG/GM vaginal cream Apply 1 gram topically vaginally 2 x a week at hs   fluticasone (FLONASE) 50 MCG/ACT nasal spray Place 2 sprays into both nostrils as needed. Reported on 06/27/2015   gabapentin (NEURONTIN) 100 MG capsule Take 1 to 2  capsule(s)  2 - 3 x / day as needed for back pain   lidocaine (XYLOCAINE) 5 % ointment Apply 1 application topically four times daily as needed.   linaclotide (LINZESS) 145 MCG CAPS capsule Take  1 capsule  Daily for IBS - C   nitrofurantoin, macrocrystal-monohydrate, (MACROBID) 100 MG capsule Take 1 capsule by mouth daily with food for UTI prevention.   pantoprazole (PROTONIX) 40 MG tablet Take 1 tablet by mouth twice daily to prevent indigestion and heartburn.   phentermine (ADIPEX-P) 37.5 MG tablet TAKE 1 TABLET EVERY MORNING FOR DIETING & WEIGHT LOSS   rosuvastatin (CRESTOR)  20 MG tablet Take 1 tablet mouth daily for  cholesterol.   topiramate (TOPAMAX) 50 MG tablet Take 1 tablet by mouth twice daily with supper and at bedtime.   traZODone (DESYREL) 150 MG tablet Take 2 tablets by mouth 1 hour before bedtime as needed for sleep.**Generic equivalent for Desyrel.**   valACYclovir (VALTREX) 500 MG tablet Take 1 tablet Daily prophylaxis for Cold Sores / Fever Blisters   No current facility-administered medications on file prior to visit.      Allergies  Allergen Reactions   Atenolol Other (See Comments)    Made patient feel like a "zombie"   Ambien [Zolpidem Tartrate]     Memory issues   Mirapex [Pramipexole Dihydrochloride]     Hot flashes   Octacosanol     Hot flashes   Requip [Ropinirole Hcl]     Hot flashes      PMHx:   Past Medical History:  Diagnosis Date   Allergy    SEASONAL   Anxiety    Back injury    Barrett's esophagus    Chest pain, unspecified    normal myoview 2000;  echo 5/10:  mild LVH, EF 60-65%   DDD (degenerative disc disease)    Depression    GERD (gastroesophageal reflux disease)    Hiatal hernia    HSV-1 (herpes simplex virus 1) infection    Hyperlipidemia    IBS (irritable bowel syndrome)    Insomnia    Lumbago    Melanosis coli    Palpitations    h/o symptomatic PVCs   Palpitations    Persistent disorder of initiating or maintaining sleep    Restless legs syndrome (RLS)       Immunization History  Administered Date(s) Administered   COVID-19, mRNA, vaccine(Comirnaty)12 years and older 03/28/2022   Influenza Inj Mdck Quad With Preservative 04/15/2019   Influenza Split 04/15/2013, 04/27/2014   Influenza, Seasonal, Injecte, Preservative Fre 04/23/2016   Influenza,inj,Quad PF,6+ Mos 04/06/2020, 03/21/2021, 03/21/2022   Influenza-Unspecified 03/27/2012   PFIZER(Purple Top)SARS-COV-2 Vaccination 08/13/2019, 09/03/2019, 04/06/2020   PPD Test 04/15/2013, 04/27/2014, 06/30/2015, 09/18/2016, 11/14/2017, 12/24/2018, 03/15/2020, 03/21/2021, 03/21/2022    Pneumococcal-Unspecified 05/27/2009   Td 05/27/2001   Tdap 04/08/2012   Zoster Recombinat (Shingrix) 04/18/2020      Past Surgical History:  Procedure Laterality Date   COLONOSCOPY  2013   ESOPHAGOGASTRODUODENOSCOPY N/A 03/04/2014   Procedure: ESOPHAGOGASTRODUODENOSCOPY (EGD);  Surgeon: Meryl Dare, MD;  Location: [facility];  Service: Endoscopy;  Laterality: N/A;   EYE SURGERY  2001   REFRACTIVE SURGERY     bilateral eyes    TUBES IN EARS     as an adult   UPPER GASTROINTESTINAL ENDOSCOPY       FHx:    Reviewed / unchanged   SHx:    Reviewed / unchanged    Systems Review:  Constitutional: Denies fever, chills, wt changes, headaches, insomnia, fatigue, night sweats, change in appetite. Eyes: Denies redness, blurred vision, diplopia, discharge, itchy, watery eyes.  ENT: Denies discharge, congestion, post nasal drip, epistaxis, sore throat, earache, hearing loss, dental pain, tinnitus, vertigo, sinus pain, snoring.  CV: Denies chest pain, palpitations, irregular heartbeat, syncope, dyspnea, diaphoresis, orthopnea, PND, claudication or edema. Respiratory: denies cough, dyspnea, DOE, pleurisy, hoarseness, laryngitis, wheezing.  Gastrointestinal: Denies dysphagia, odynophagia, heartburn, reflux, water brash, abdominal pain or cramps, nausea, vomiting, bloating, diarrhea, constipation, hematemesis, melena, hematochezia  or hemorrhoids. Genitourinary: Denies dysuria, frequency, urgency, nocturia, hesitancy, discharge, hematuria or flank pain. Musculoskeletal: Denies arthralgias, myalgias, stiffness, jt.  swelling, pain, limping or strain/sprain.  Skin: Denies pruritus, rash, hives, warts, acne, eczema or change in skin lesion(s). Neuro: No weakness, tremor, incoordination, spasms, paresthesia or pain. Psychiatric: Denies confusion, memory loss or sensory loss. Endo: Denies change in weight, skin or hair change.  Heme/Lymph: No excessive bleeding, bruising or enlarged lymph  nodes.   Physical Exam  LMP 12/26/2015   Appears  well nourished, well groomed  and in no distress.  Eyes: PERRLA, EOMs, conjunctiva no swelling or erythema. Sinuses: No frontal/maxillary tenderness ENT/Mouth: EAC's clear, TM's nl w/o erythema, bulging. Nares clear w/o erythema, swelling, exudates. Oropharynx clear without erythema or exudates. Oral hygiene is good. Tongue normal, non obstructing. Hearing intact.  Neck: Supple. Thyroid not palpable. Car 2+/2+ without bruits, nodes or JVD. Chest: Respirations nl with BS clear & equal w/o rales, rhonchi, wheezing or stridor.  Cor: Heart sounds normal w/ regular rate and rhythm without sig. murmurs, gallops, clicks or rubs. Peripheral pulses normal and equal  without edema.  Abdomen: Soft & bowel sounds normal. Non-tender w/o guarding, rebound, hernias, masses or organomegaly.  Lymphatics: Unremarkable.  Musculoskeletal: Full ROM all peripheral extremities, joint stability, 5/5 strength and normal gait.  Skin: Warm, dry without exposed rashes, lesions or ecchymosis apparent.  Neuro: Cranial nerves intact, reflexes equal bilaterally. Sensory-motor testing grossly intact. Tendon reflexes grossly intact.  Pysch: Alert & oriented x 3.  Insight and judgement nl & appropriate. No ideations.   Assessment and Plan:  - Continue medication, monitor blood pressure at home.  - Continue DASH diet.  Reminder to go to the ER if any CP,  SOB, nausea, dizziness, severe HA, changes vision/speech.  - Continue diet/meds, exercise,& lifestyle modifications.  - Continue monitor periodic cholesterol/liver & renal functions    - Continue diet, exercise  - Lifestyle modifications.  - Monitor appropriate labs. - Continue supplementation.        Discussed  regular exercise, BP monitoring, weight control to achieve/maintain BMI less than 25 and discussed med and SE's. Recommended labs to assess /monitor clinical status .  I discussed the assessment and  treatment plan with the patient. The patient was provided an opportunity to ask questions and all were answered. The patient agreed with the plan and demonstrated an understanding of the instructions.  I provided over 30 minutes of exam, counseling, chart review and  complex critical decision making.        The patient was advised to call back or seek an in-person evaluation if the symptoms worsen or if the condition fails to improve as anticipated.   Marinus Maw, MD

## 2022-10-01 NOTE — Patient Instructions (Signed)

## 2022-10-15 NOTE — Progress Notes (Deleted)
57 y.o. G0P0 Single White or Caucasian Not Hispanic or Latino female here for annual exam.      Patient's last menstrual period was 12/26/2015.          Sexually active: {yes no:314532}  The current method of family planning is {contraception:315051}.    Exercising: {yes no:314532}  {types:19826} Smoker:  {YES [id number]  Health Maintenance: Pap: 06/03/2018 wnl Hr Hpv neg;  07/17/14 Wnl Hr HPV Neg History of abnormal Pap:  no MMG:  06/17/22 Bi-rads 1 neg  BMD:   none  Colonoscopy:  06/15/13 f/u 10 years  TDaP:  04/08/12  Gardasil: n/a   reports that she has never smoked. She has never used smokeless tobacco. She reports current alcohol use of about 1.0 standard drink of alcohol per week. She reports that she does not use drugs.  Past Medical History:  Diagnosis Date   Allergy    SEASONAL   Anxiety    Back injury    Barrett's esophagus    Chest pain, unspecified    normal myoview 2000;  echo 5/10:  mild LVH, EF 60-65%   DDD (degenerative disc disease)    Depression    GERD (gastroesophageal reflux disease)    Hiatal hernia    HSV-1 (herpes simplex virus 1) infection    Hyperlipidemia    IBS (irritable bowel syndrome)    Insomnia    Lumbago    Melanosis coli    Palpitations    h/o symptomatic PVCs   Palpitations    Persistent disorder of initiating or maintaining sleep    Restless legs syndrome (RLS)     Past Surgical History:  Procedure Laterality Date   COLONOSCOPY  2013   ESOPHAGOGASTRODUODENOSCOPY N/A 03/04/2014   Procedure: ESOPHAGOGASTRODUODENOSCOPY (EGD);  Surgeon: Meryl Dare, MD;  Location: [facility];  Service: Endoscopy;  Laterality: N/A;   EYE SURGERY  2001   REFRACTIVE SURGERY     bilateral eyes    TUBES IN EARS     as an adult   UPPER GASTROINTESTINAL ENDOSCOPY      Current Outpatient Medications  Medication Sig Dispense Refill   buPROPion (WELLBUTRIN XL) 300 MG 24 hr tablet Take  1 table t every Morning for Mood, Focus and Concentration                            /                                 TAKE                                BY                                MOUTH 90 tablet 3   DULoxetine (CYMBALTA) 30 MG capsule Take 1 capsule by mouth twice daily for chronic back pain. 180 capsule 1   estradiol (ESTRACE) 0.1 MG/GM vaginal cream Apply 1 gram topically vaginally 2 x a week at hs 127.5 g 3   fluticasone (FLONASE) 50 MCG/ACT nasal spray Place 2 sprays into both nostrils as needed. Reported on 06/27/2015     gabapentin (NEURONTIN) 100 MG capsule Take 1 to 2  capsule(s)  2 - 3 x / day as needed  for back pain 270 capsule 3   lidocaine (XYLOCAINE) 5 % ointment Apply 1 application topically four times daily as needed. 240 g 3   linaclotide (LINZESS) 145 MCG CAPS capsule Take  1 capsule  Daily for IBS - C 90 capsule 3   nitrofurantoin, macrocrystal-monohydrate, (MACROBID) 100 MG capsule Take 1 capsule by mouth daily with food for UTI prevention. 90 capsule 0   pantoprazole (PROTONIX) 40 MG tablet Take 1 tablet by mouth twice daily to prevent indigestion and heartburn. 180 tablet 2   phentermine (ADIPEX-P) 37.5 MG tablet TAKE 1 TABLET EVERY MORNING FOR DIETING & WEIGHT LOSS 90 tablet 1   rosuvastatin (CRESTOR) 20 MG tablet Take 1 tablet mouth daily for cholesterol. 90 tablet 2   topiramate (TOPAMAX) 50 MG tablet Take 1 tablet by mouth twice daily with supper and at bedtime. 180 tablet 2   traZODone (DESYREL) 150 MG tablet Take 2 tablets by mouth 1 hour before bedtime as needed for sleep.**Generic equivalent for Desyrel.** 180 tablet 0   valACYclovir (VALTREX) 500 MG tablet Take 1 tablet Daily prophylaxis for Cold Sores / Fever Blisters 90 tablet 3   No current facility-administered medications for this visit.    Family History  Problem Relation Age of Onset   Heart disease Cousin    Lung cancer Cousin    Hypertension Mother    Osteoporosis Mother    Breast cancer Sister 62       BRCA NEG/BILAT MASTECTOMY/CHEMO X 1 YR   Hyperlipidemia  Sister    Ovarian cancer Sister    Colon cancer Maternal Grandmother    Stroke Maternal Grandmother    Osteoporosis Maternal Grandmother    Colon cancer Maternal Grandfather    Multiple myeloma Paternal Grandmother    Hypertension Father    Hyperlipidemia Father    Esophageal cancer Neg Hx    Liver cancer Neg Hx    Pancreatic cancer Neg Hx    Rectal cancer Neg Hx    Stomach cancer Neg Hx     Review of Systems  Exam:   LMP 12/26/2015   Weight change: @WEIGHTCHANGE @ Height:      Ht Readings from Last 3 Encounters:  03/21/22 5\' 9"  (1.753 m)  06/19/21 5\' 9"  (1.753 m)  06/14/21 5\' 9"  (1.753 m)    General appearance: alert, cooperative and appears stated age Head: Normocephalic, without obvious abnormality, atraumatic Neck: no adenopathy, supple, symmetrical, trachea midline and thyroid {CHL AMB PHY EX THYROID NORM [phone]::"normal to inspection and palpation"} Lungs: clear to auscultation bilaterally Cardiovascular: regular rate and rhythm Breasts: {Exam; breast:13139::"normal appearance, no masses or tenderness"} Abdomen: soft, non-tender; non distended,  no masses,  no organomegaly Extremities: extremities normal, atraumatic, no cyanosis or edema Skin: Skin color, texture, turgor normal. No rashes or lesions Lymph nodes: Cervical, supraclavicular, and axillary nodes normal. No abnormal inguinal nodes palpated Neurologic: Grossly normal   Pelvic: External genitalia:  no lesions              Urethra:  normal appearing urethra with no masses, tenderness or lesions              Bartholins and Skenes: normal                 Vagina: normal appearing vagina with normal color and discharge, no lesions              Cervix: {CHL AMB PHY EX CERVIX NORM DEFAULT:[phone]::"no lesions"}  Bimanual Exam:  Uterus:  {CHL AMB PHY EX UTERUS NORM DEFAULT:[phone]::"normal size, contour, position, consistency, mobility, non-tender"}              Adnexa: {CHL AMB PHY  EX ADNEXA NO MASS DEFAULT:[phone]::"no mass, fullness, tenderness"}               Rectovaginal: Confirms               Anus:  normal sphincter tone, no lesions  *** chaperoned for the exam.  A:  Well Woman with normal exam  P:

## 2022-10-22 ENCOUNTER — Ambulatory Visit: Payer: BC Managed Care – PPO | Admitting: Obstetrics and Gynecology

## 2022-10-22 DIAGNOSIS — M5412 Radiculopathy, cervical region: Secondary | ICD-10-CM | POA: Diagnosis not present

## 2022-10-27 ENCOUNTER — Other Ambulatory Visit: Payer: Self-pay | Admitting: Nurse Practitioner

## 2022-10-27 DIAGNOSIS — N390 Urinary tract infection, site not specified: Secondary | ICD-10-CM

## 2022-11-08 ENCOUNTER — Other Ambulatory Visit: Payer: Self-pay | Admitting: Internal Medicine

## 2022-11-08 DIAGNOSIS — N952 Postmenopausal atrophic vaginitis: Secondary | ICD-10-CM

## 2022-11-14 DIAGNOSIS — M5412 Radiculopathy, cervical region: Secondary | ICD-10-CM | POA: Diagnosis not present

## 2022-12-17 ENCOUNTER — Other Ambulatory Visit: Payer: Self-pay | Admitting: Internal Medicine

## 2022-12-17 ENCOUNTER — Other Ambulatory Visit: Payer: Self-pay | Admitting: Nurse Practitioner

## 2022-12-17 DIAGNOSIS — K2100 Gastro-esophageal reflux disease with esophagitis, without bleeding: Secondary | ICD-10-CM

## 2022-12-17 DIAGNOSIS — E782 Mixed hyperlipidemia: Secondary | ICD-10-CM

## 2023-01-05 ENCOUNTER — Encounter: Payer: Self-pay | Admitting: Nurse Practitioner

## 2023-01-05 DIAGNOSIS — K581 Irritable bowel syndrome with constipation: Secondary | ICD-10-CM

## 2023-01-08 ENCOUNTER — Other Ambulatory Visit: Payer: Self-pay

## 2023-01-08 ENCOUNTER — Telehealth: Payer: Self-pay | Admitting: Nurse Practitioner

## 2023-01-08 DIAGNOSIS — K581 Irritable bowel syndrome with constipation: Secondary | ICD-10-CM

## 2023-01-08 MED ORDER — LINACLOTIDE 145 MCG PO CAPS
ORAL_CAPSULE | ORAL | 3 refills | Status: DC
Start: 2023-01-08 — End: 2023-09-17

## 2023-01-08 NOTE — Telephone Encounter (Signed)
[employer] called and waned to let us know linzess was approved

## 2023-01-18 ENCOUNTER — Other Ambulatory Visit: Payer: Self-pay | Admitting: Internal Medicine

## 2023-01-18 DIAGNOSIS — N390 Urinary tract infection, site not specified: Secondary | ICD-10-CM

## 2023-02-19 DIAGNOSIS — H6981 Other specified disorders of Eustachian tube, right ear: Secondary | ICD-10-CM | POA: Diagnosis not present

## 2023-02-19 DIAGNOSIS — H6521 Chronic serous otitis media, right ear: Secondary | ICD-10-CM | POA: Diagnosis not present

## 2023-03-13 ENCOUNTER — Other Ambulatory Visit: Payer: Self-pay | Admitting: Internal Medicine

## 2023-03-13 DIAGNOSIS — G4700 Insomnia, unspecified: Secondary | ICD-10-CM

## 2023-03-13 DIAGNOSIS — K2100 Gastro-esophageal reflux disease with esophagitis, without bleeding: Secondary | ICD-10-CM

## 2023-03-23 ENCOUNTER — Other Ambulatory Visit: Payer: Self-pay | Admitting: Nurse Practitioner

## 2023-03-23 DIAGNOSIS — K2100 Gastro-esophageal reflux disease with esophagitis, without bleeding: Secondary | ICD-10-CM

## 2023-03-27 ENCOUNTER — Other Ambulatory Visit: Payer: Self-pay | Admitting: Nurse Practitioner

## 2023-03-27 ENCOUNTER — Encounter: Payer: BC Managed Care – PPO | Admitting: Internal Medicine

## 2023-03-31 ENCOUNTER — Other Ambulatory Visit: Payer: Self-pay | Admitting: Nurse Practitioner

## 2023-03-31 DIAGNOSIS — G4700 Insomnia, unspecified: Secondary | ICD-10-CM

## 2023-05-01 ENCOUNTER — Encounter: Payer: BC Managed Care – PPO | Admitting: Internal Medicine

## 2023-05-19 ENCOUNTER — Encounter: Payer: Self-pay | Admitting: Internal Medicine

## 2023-05-19 ENCOUNTER — Encounter: Payer: Self-pay | Admitting: Nurse Practitioner

## 2023-05-19 ENCOUNTER — Ambulatory Visit: Payer: BC Managed Care – PPO | Admitting: Internal Medicine

## 2023-05-19 ENCOUNTER — Ambulatory Visit: Payer: BC Managed Care – PPO

## 2023-05-19 VITALS — BP 140/88 | HR 69 | Temp 97.9°F | Ht 69.0 in | Wt 176.8 lb

## 2023-05-19 DIAGNOSIS — L03012 Cellulitis of left finger: Secondary | ICD-10-CM

## 2023-05-19 MED ORDER — DEXAMETHASONE 2 MG PO TABS: ORAL_TABLET | ORAL | 0 refills | Status: DC

## 2023-05-19 MED ORDER — DOXYCYCLINE HYCLATE 100 MG PO CAPS: ORAL_CAPSULE | ORAL | 0 refills | Status: DC

## 2023-05-19 NOTE — Progress Notes (Unsigned)
[facility]      ADULT   &   ADOLESCENT      INTERNAL MEDICINE  Lucky Cowboy, M.D.          Rance Muir, ANP        Adela Glimpse, FNP  [facility] [address]  [place], [place]. [address] Telephone [phone] Telefax [phone]   Future Appointments  Date Time Provider Department  06/09/2023  3:00 PM Lucky Cowboy, MD [facility]    History of Present Illness:     This very nice 57 y.o.female with HTN, CKD3a, HLD, Pre-Diabetes and Vitamin D Deficiency  presents with a 1 week history of redness about her left ring fingernail that she has treated with warm salt water soaks and antibiotic ointment and despite these interventions, the redness & discomfort has progressed with development of blisters about the distal finger.   Current Outpatient Medications on File Prior to Visit  Medication Sig   buPROPion  XL 300 MG 24 hr tablet Take  1 tablet    every Morning     DULoxetine  30 MG capsule Take 1 capsule twice daily for chronic back pain.   estradiol 0.1 MG/GM vaginal cream Insert 1 gram vaginally using applicator twice weekly at bedtime.   FLONASE T nasal spray Place 2 sprays into both nostrils as needed. Reported on 06/27/2015   gabapentin  100 MG capsule Take 1 to 2  capsule(s)  2 - 3 x / day as needed for back pain   lidocaine 5 % ointment Apply  topically 4 x / daily as needed.   linaclotide )145 MCG CAPS capsule Take  1 capsule  Daily for IBS - C   nitrofurantoin 100 MG capsule Take 1 capsule Daily with Food    pantoprazole  40 MG tablet Take 1 tablet 2 x / day    phentermine  37.5 MG tablet TAKE 1 TABLET EVERY MORNING    rosuvastatin  20 MG tablet Take  1 tablet   Daily      topiramate 50 MG tablet Take 1 tablet 2x / day with supper /bedtime.   traZODone  150 MG tablet Take 2 tablets 1 hour before bedtime    valACYclovir 500 MG tablet Take 1 tablet Daily      Allergies  Allergen Reactions   Atenolol Other (See Comments)    Made  patient feel like a "zombie"   Ambien [Zolpidem Tartrate]     Memory issues   Mirapex [Pramipexole Dihydrochloride]     Hot flashes   Octacosanol     Hot flashes   Requip [Ropinirole Hcl]     Hot flashes    Problem list She has OSA (obstructive sleep apnea); RESTLESS LEGS SYNDROME; HNP (herniated nucleus pulposus), lumbar; Hyperlipidemia; Lumbago; Insomnia; HSV-1 (herpes simplex virus 1) infection; DDD (degenerative disc disease); GERD (gastroesophageal reflux disease); Barrett's esophagus; Vitamin D deficiency; Abnormal glucose; Medication management; IBS (irritable colon syndrome); Labile hypertension; Memory loss; Chronic daily headache; Generalized anxiety disorder; CKD (chronic kidney disease) stage 3, GFR 30-59 ml/min; Overweight (BMI 25.0-29.9); and Chronic constipation on their problem list.   Observations/Objective:  BP (!) 140/88   Pulse 69   Temp 97.9 F (36.6 C)   Ht 5\' 9"  (1.753 m)   Wt 176 lb 12.8 oz (80.2 kg)   LMP 12/26/2015   SpO2 99%   BMI 26.11 kg/m   Exam focused on Left index finger finds nl ROM & capillary refill  distal paronychial  erythema and few tiny vesicles . No lymphangitic streaking .   Assessment and Plan:   1. Cellulitis of left index finger   - doxycycline 100 MG capsule;     Take 1 capsule 2 x / day with Food for Infection    Dispense: 20 capsule; Refill: 0  - dexamethasone  2 MG tablet;   Take 1 tab 3 x /day for 2 days, then 2 x /day for 2  Days, then 1 tab daily   Dispense: 13 tablet; Refill: 0   Follow Up Instructions:        I discussed the assessment and treatment plan with the patient. The patient was provided an opportunity to ask questions and all were answered. The patient agreed with the plan and demonstrated an understanding of the instructions.        The patient was advised to call back or seek an in-person evaluation if the symptoms worsen or if the condition fails to improve as anticipated.    Marinus Maw,  MD

## 2023-05-26 ENCOUNTER — Other Ambulatory Visit: Payer: Self-pay | Admitting: Internal Medicine

## 2023-05-26 DIAGNOSIS — E66812 Obesity, class 2: Secondary | ICD-10-CM

## 2023-05-26 MED ORDER — PHENTERMINE HCL 37.5 MG PO TABS: ORAL_TABLET | ORAL | 0 refills | Status: DC

## 2023-06-09 ENCOUNTER — Ambulatory Visit: Payer: BC Managed Care – PPO | Admitting: Internal Medicine

## 2023-06-09 ENCOUNTER — Encounter: Payer: Self-pay | Admitting: Internal Medicine

## 2023-06-09 NOTE — Progress Notes (Signed)
 [facility]   Couldn't Get car out of Ice / [employer]     NEEDS  3 mo   OV   [name]  NEEDS  6 mo   OV   [name]  NEEDS  9 mo  OV   Pamela Pamela      ADULT   &   ADOLESCENT      INTERNAL MEDICINE  Pamela Pamela, M.D.          Pamela Pamela, ANP        Pamela Necessary, FNP  [facility] [address]  [place], [place]. [address] Telephone [phone] Telefax [phone]   Annual Screening/Preventative Visit & Comprehensive Evaluation &  Examination   Future Appointments  Date Time Provider Department  06/09/2023                    cpe  3:00 PM Pamela Elsie, MD [facility]  06/21/2024                    cpe  3:00 PM Pamela Elsie, MD [facility]        This very nice 58 y.o. single WF  with  labile HTN, HLD, IBS-C, GERD (hx Barrett's), Prediabetes  and Vitamin D  Deficiency  presents for a Screening /Preventative Visit & comprehensive evaluation and management of multiple medical co-morbidities.           Labile HTN predates since 2004.   Patient's BP has been controlled at home and patient denies any cardiac symptoms as chest pain, palpitations, shortness of breath, dizziness or ankle swelling. Today's BP  is at goal -                        .  Patient has CKD3a (GFR 46) but had improved to CKD2 (GFR 70) last Jan 2024. Patient is followed by Dr Andrez Heman .        Patient's hyperlipidemia is controlled with diet & Rosuvastatin . Patient denies myalgias or other medication SE's. Last lipids were not at goal :  Lab Results  Component Value Date   CHOL 210 (H) 06/19/2022   HDL 52 06/19/2022   LDLCALC not calculated 06/19/2022   TRIG 484 (H) 06/19/2022   CHOLHDL 4.0 06/19/2022        Patient his  followed proactively for glucose intolerance and patient denies reactive hypoglycemic symptoms, visual blurring, diabetic polys or paresthesias. Last A1c was not at goal :  Lab Results  Component Value Date   HGBA1C 5.8 (H) 06/19/2022         Finally, patient has history of Vitamin D  Deficiency a (36 /2012)  and last Vitamin D  was at goal :  Lab Results  Component Value Date   VD25OH 90  06/19/2022       Current Outpatient Medications  Medication Instructions   buPROPion  XL 300 MG 24 hr  Take  1 tablet    every Morning     DULoxetine   30 MG capsule Take 1 capsule 2 x / day for chronic back pain.   estradiol  (ESTRACE ) 0.1 MG/GM vag crm Insert 1 gram vaginally  twice weekly    FLONASE nasal spray 2 sprays, As needed   gabapentin   100 MG capsule Take 1 to 2  capsule(s)  2 - 3 x / day as needed    lidocaine  5 % ointment Apply 1 application topically four times daily   linaclotide  (LINZESS ) 145 MCG CAPS Take  1 capsule  Daily for IBS - C   MACROBID  100 MG capsule Take 1 capsule Daily for UTI prevention        pantoprazole   40 MG tablet Take 1 tablet 2 x /day  to prevent Indigestion    phentermine   37.5 MG tablet TAKE 1 TABLET EVERY MORNING    rosuvastatin   20 MG tablet Take  1 tablet   Daily     topiramate  50 MG tablet Take 1 tablet  twice daily    traZODone   150 MG tablet Take 2 tablets bedtime as needed for sleep.   valACYclovir  500 MG tablet Take 1 tablet Daily prophylaxis for Cold Sores     Allergies  Allergen Reactions   Atenolol Other (See Comments)    Made patient feel like a zombie   Ambien  [Zolpidem  Tartrate]     Memory issues   Cholestatin     Hot flashes   Mirapex [Pramipexole Dihydrochloride]     Hot flashes   Requip [Ropinirole Hcl]     Hot flashes     Past Medical History:  Diagnosis Date   Allergy    SEASONAL   Anxiety    Back injury    Barrett's esophagus    Chest pain, unspecified    normal myoview  2000;  echo 5/10:  mild LVH, EF 60-65%   DDD  (degenerative disc disease)    Depression    GERD (gastroesophageal reflux disease)    Hiatal hernia    HSV-1 (herpes simplex virus 1) infection    Hyperlipidemia    IBS (irritable bowel syndrome)    Insomnia    Lumbago    Melanosis coli    Palpitations    h/o symptomatic PVCs   Palpitations    Persistent disorder of initiating or maintaining sleep    Restless legs syndrome (RLS)      Health Maintenance  Topic Date Due   Pneumococcal Vaccine 65-61 Years old (1 - PCV) 10/27/1970   COVID-19 Vaccine (4 - Booster for Pfizer series) 06/01/2020   Zoster Vaccines- Shingrix (2 of 2) 06/13/2020   INFLUENZA VACCINE  12/25/2020   MAMMOGRAM  04/05/2021   PAP SMEAR-Modifier  06/03/2021   TETANUS/TDAP  04/08/2022   COLONOSCOPY  04/15/2024   Hepatitis C Screening  Completed   HIV Screening  Completed   HPV VACCINES  Aged Out     Immunization History  Administered Date(s) Administered   Influenza Inj Mdck Quad  04/15/2019   Influenza  04/15/2013, 04/27/2014   Influenza, Seasonal 04/23/2016   Influenza,inj,Quad  04/06/2020   Influenza 03/27/2012   PFIZER-SARS-COV-2 Vacc 08/13/2019, 09/03/2019, 04/06/2020   PPD Test 11/14/2017, 12/24/2018, 03/15/2020   Pneumococcal - 23 05/27/2009   Td 05/27/2001   Tdap 04/08/2012   Zoster Recombinat (Shingrix) 04/18/2020  Last Colon - 04/15/2014 - Dr Aneita - Negative - Recc 10 yr f/u - due Nov 2025   Last EGD - 12/10/2017 - Dr Aneita - Negative Bx's - Recc f/u 3 yrs - Over due - patient aware    Last MGM - 06/17/2022    Past Surgical History:  Procedure Laterality Date   COLONOSCOPY  2013   ESOPHAGOGASTRODUODENOSCOPY N/A 03/04/2014   EGD);  Gwendlyn ONEIDA Aneita, MD;     EYE SURGERY  2001   REFRACTIVE SURGERY     bilateral eyes    TUBES IN EARS     as an adult   UPPER GASTROINTESTINAL ENDOSCOPY       Family History  Problem Relation Age of Onset   Heart disease Cousin    Lung cancer Cousin    Hypertension Mother    Osteoporosis  Mother    Breast cancer Sister 59       BRCA NEG/BILAT MASTECTOMY/CHEMO X 1 YR   Hyperlipidemia Sister    Ovarian cancer Sister    Colon cancer Maternal Grandmother    Stroke Maternal Grandmother    Osteoporosis Maternal Grandmother    Colon cancer Maternal Grandfather    Multiple myeloma Paternal Grandmother    Hypertension Father    Hyperlipidemia Father    Esophageal cancer Neg Hx    Liver cancer Neg Hx    Pancreatic cancer Neg Hx    Rectal cancer Neg Hx    Stomach cancer Neg Hx      Social History   Tobacco Use   Smoking status: Never   Smokeless tobacco: Never  Vaping Use   Vaping Use: Never used  Substance Use Topics   Alcohol use: Yes    Alcohol/week: 1.0 standard drink    Types: 1 Standard drinks or equivalent per week    Comment: Social   Drug use: No      ROS Constitutional: Denies fever, chills, weight loss/gain, headaches, insomnia,  night sweats, and change in appetite. Does c/o fatigue. Eyes: Denies redness, blurred vision, diplopia, discharge, itchy, watery eyes.  ENT: Denies discharge, congestion, post nasal drip, epistaxis, sore throat, earache, hearing loss, dental pain, Tinnitus, Vertigo, Sinus pain, snoring.  Cardio: Denies chest pain, palpitations, irregular heartbeat, syncope, dyspnea, diaphoresis, orthopnea, PND, claudication, edema Respiratory: denies cough, dyspnea, DOE, pleurisy, hoarseness, laryngitis, wheezing.  Gastrointestinal: Denies dysphagia, heartburn, reflux, water brash, pain, cramps, nausea, vomiting, bloating, diarrhea, constipation, hematemesis, melena, hematochezia, jaundice, hemorrhoids Genitourinary: Denies dysuria, frequency, urgency, nocturia, hesitancy, discharge, hematuria, flank pain Breast: Breast lumps, nipple discharge, bleeding.  Musculoskeletal: Denies arthralgia, myalgia, stiffness, Jt. Swelling, pain, limp, and strain/sprain. Denies falls. Skin: Denies puritis, rash, hives, warts, acne, eczema, changing in skin  lesion Neuro: No weakness, tremor, incoordination, spasms, paresthesia, pain Psychiatric: Denies confusion, memory loss, sensory loss. Denies Depression. Endocrine: Denies change in weight, skin, hair change, nocturia, and paresthesia, diabetic polys, visual blurring, hyper / hypo glycemic episodes.  Heme/Lymph: No excessive bleeding, bruising, enlarged lymph nodes.  Physical Exam  LMP 12/26/2015   General Appearance: Well nourished, well groomed and in no apparent distress.  Eyes: PERRLA, EOMs, conjunctiva no swelling or erythema, normal fundi and vessels. Sinuses: No frontal/maxillary tenderness ENT/Mouth: EACs patent / TMs  nl. Nares clear without erythema, swelling, mucoid exudates. Oral hygiene is good. No erythema, swelling, or exudate. Tongue normal, non-obstructing. Tonsils not swollen or erythematous. Hearing normal.  Neck: Supple, thyroid  not palpable. No bruits, nodes or JVD. Respiratory: Respiratory effort normal.  BS equal and clear  bilateral without rales, rhonci, wheezing or stridor. Cardio: Heart sounds are normal with regular rate and rhythm and no murmurs, rubs or gallops. Peripheral pulses are normal and equal bilaterally without edema. No aortic or femoral bruits. Chest: symmetric with normal excursions and percussion. Breasts: Symmetric, without lumps, nipple discharge, retractions, or fibrocystic changes.  Abdomen: Flat, soft with bowel sounds active. Nontender, no guarding, rebound, hernias, masses, or organomegaly.  Lymphatics: Non tender without lymphadenopathy.  Genitourinary:  Musculoskeletal: Full ROM all peripheral extremities, joint stability, 5/5 strength, and normal gait. Skin: Warm and dry without rashes, lesions, cyanosis, clubbing or  ecchymosis.  Neuro: Cranial nerves intact, reflexes equal bilaterally. Normal muscle tone, no cerebellar symptoms. Sensation intact.  Pysch: Alert and oriented X 3, normal affect, Insight and Judgment appropriate.     Assessment and Plan   1. Annual Preventative Screening Examination   2. Labile hypertension  - EKG 12-Lead - US , RETROPERITNL ABD,  LTD - Urinalysis, Routine w reflex microscopic - Microalbumin / creatinine urine ratio - CBC with Differential/Platelet - COMPLETE METABOLIC PANEL WITH GFR - Magnesium - TSH   3. Hyperlipidemia, mixed  - EKG 12-Lead - US , RETROPERITNL ABD,  LTD - Lipid panel - TSH   4. Abnormal glucose  - EKG 12-Lead - US , RETROPERITNL ABD,  LTD - Hemoglobin A1c - Insulin , random   5. Vitamin D  deficiency  - VITAMIN D  25 Hydroxy    6. Chronic midline low back pain without sciatica   7. Gastroesophageal reflux disease with esophagitis  - CBC with Differential/Platelet   8. CKD (chronic kidney disease) stage 2, GFR 60-89 ml/min  - Urinalysis, Routine w reflex microscopic - Microalbumin / creatinine urine ratio   9. Irritable bowel syndrome with constipation   10. Screening-pulmonary TB  - TB Skin Test   11. Screening for colorectal cancer  - POC Hemoccult Bld/Stl    12. Screening for heart disease  - EKG 12-Lead  13. FHx: heart disease  - EKG 12-Lead - US , RETROPERITNL ABD,  LTD   14. Screening for AAA (aortic abdominal aneurysm)  - US , RETROPERITNL ABD,  LTD   15. Fatigue, unspecified type  - Vitamin B12 - Iron, TIBC and Ferritin Panel - CBC with Differential/Platelet - TSH   16. Medication management  - Urinalysis, Routine w reflex microscopic - Microalbumin / creatinine urine ratio - CBC with Differential/Platelet - COMPLETE METABOLIC PANEL WITH GFR - Magnesium - Lipid panel - TSH - Hemoglobin A1c - Insulin , random - VITAMIN D  25 Hydroxy        Patient was counseled in prudent diet to achieve/maintain BMI less than 25 for weight control, BP monitoring, regular exercise and medications. Discussed med's effects and SE's. Screening labs and tests as requested with regular follow-up as recommended.  Over 40 minutes of exam, counseling, chart review and high complex critical decision making was performed.   Pamela JONETTA Richards, MD

## 2023-06-10 DIAGNOSIS — M7581 Other shoulder lesions, right shoulder: Secondary | ICD-10-CM | POA: Diagnosis not present

## 2023-06-10 DIAGNOSIS — M7582 Other shoulder lesions, left shoulder: Secondary | ICD-10-CM | POA: Diagnosis not present

## 2023-06-10 DIAGNOSIS — M5030 Other cervical disc degeneration, unspecified cervical region: Secondary | ICD-10-CM | POA: Diagnosis not present

## 2023-06-15 ENCOUNTER — Other Ambulatory Visit: Payer: Self-pay | Admitting: Internal Medicine

## 2023-06-15 DIAGNOSIS — E66812 Obesity, class 2: Secondary | ICD-10-CM

## 2023-07-14 ENCOUNTER — Other Ambulatory Visit: Payer: Self-pay

## 2023-07-14 ENCOUNTER — Encounter: Payer: BC Managed Care – PPO | Admitting: Internal Medicine

## 2023-07-14 DIAGNOSIS — K2100 Gastro-esophageal reflux disease with esophagitis, without bleeding: Secondary | ICD-10-CM

## 2023-07-14 MED ORDER — BUPROPION HCL ER (XL) 300 MG PO TB24: ORAL_TABLET | ORAL | 0 refills | Status: DC

## 2023-08-18 ENCOUNTER — Ambulatory Visit: Payer: BC Managed Care – PPO | Admitting: Family Medicine

## 2023-08-18 ENCOUNTER — Encounter: Payer: Self-pay | Admitting: Family Medicine

## 2023-08-18 VITALS — BP 138/88 | HR 97 | Temp 98.6°F | Ht 69.0 in | Wt 177.2 lb

## 2023-08-18 DIAGNOSIS — B009 Herpesviral infection, unspecified: Secondary | ICD-10-CM

## 2023-08-18 DIAGNOSIS — R7309 Other abnormal glucose: Secondary | ICD-10-CM | POA: Diagnosis not present

## 2023-08-18 DIAGNOSIS — E559 Vitamin D deficiency, unspecified: Secondary | ICD-10-CM | POA: Diagnosis not present

## 2023-08-18 DIAGNOSIS — N390 Urinary tract infection, site not specified: Secondary | ICD-10-CM | POA: Diagnosis not present

## 2023-08-18 DIAGNOSIS — F411 Generalized anxiety disorder: Secondary | ICD-10-CM | POA: Diagnosis not present

## 2023-08-18 DIAGNOSIS — F5103 Paradoxical insomnia: Secondary | ICD-10-CM

## 2023-08-18 DIAGNOSIS — K5909 Other constipation: Secondary | ICD-10-CM

## 2023-08-18 DIAGNOSIS — E782 Mixed hyperlipidemia: Secondary | ICD-10-CM

## 2023-08-18 DIAGNOSIS — N1831 Chronic kidney disease, stage 3a: Secondary | ICD-10-CM | POA: Diagnosis not present

## 2023-08-18 DIAGNOSIS — M542 Cervicalgia: Secondary | ICD-10-CM

## 2023-08-18 LAB — CBC WITH DIFFERENTIAL/PLATELET
Basophils Absolute: 0.1 10*3/uL (ref 0.0–0.1)
Basophils Relative: 1 % (ref 0.0–3.0)
Eosinophils Absolute: 0.3 10*3/uL (ref 0.0–0.7)
Eosinophils Relative: 3.1 % (ref 0.0–5.0)
HCT: 39.9 % (ref 36.0–46.0)
Hemoglobin: 13.4 g/dL (ref 12.0–15.0)
Lymphocytes Relative: 22.4 % (ref 12.0–46.0)
Lymphs Abs: 1.9 10*3/uL (ref 0.7–4.0)
MCHC: 33.6 g/dL (ref 30.0–36.0)
MCV: 87.8 fl (ref 78.0–100.0)
Monocytes Absolute: 0.6 10*3/uL (ref 0.1–1.0)
Monocytes Relative: 6.5 % (ref 3.0–12.0)
Neutro Abs: 5.7 10*3/uL (ref 1.4–7.7)
Neutrophils Relative %: 67 % (ref 43.0–77.0)
Platelets: 348 10*3/uL (ref 150.0–400.0)
RBC: 4.54 Mil/uL (ref 3.87–5.11)
RDW: 13.2 % (ref 11.5–15.5)
WBC: 8.6 10*3/uL (ref 4.0–10.5)

## 2023-08-18 LAB — LIPID PANEL
Cholesterol: 202 mg/dL — ABNORMAL HIGH (ref 0–200)
HDL: 63.4 mg/dL (ref >39.00)
LDL Cholesterol: 93 mg/dL (ref 0–99)
NonHDL: 138.55
Total CHOL/HDL Ratio: 3
Triglycerides: 228 mg/dL — ABNORMAL HIGH (ref 0.0–149.0)
VLDL: 45.6 mg/dL — ABNORMAL HIGH (ref 0.0–40.0)

## 2023-08-18 LAB — COMPREHENSIVE METABOLIC PANEL
ALT: 12 U/L (ref 0–35)
AST: 18 U/L (ref 0–37)
Albumin: 4.5 g/dL (ref 3.5–5.2)
Alkaline Phosphatase: 103 U/L (ref 39–117)
BUN: 11 mg/dL (ref 6–23)
CO2: 31 meq/L (ref 19–32)
Calcium: 9.6 mg/dL (ref 8.4–10.5)
Chloride: 98 meq/L (ref 96–112)
Creatinine, Ser: 1.09 mg/dL (ref 0.40–1.20)
GFR: 55.88 mL/min — ABNORMAL LOW (ref >60.00)
Glucose, Bld: 97 mg/dL (ref 70–99)
Potassium: 5 meq/L (ref 3.5–5.1)
Sodium: 136 meq/L (ref 135–145)
Total Bilirubin: 0.4 mg/dL (ref 0.2–1.2)
Total Protein: 7.6 g/dL (ref 6.0–8.3)

## 2023-08-18 LAB — VITAMIN B12: Vitamin B-12: 476 pg/mL (ref 211–911)

## 2023-08-18 LAB — VITAMIN D 25 HYDROXY (VIT D DEFICIENCY, FRACTURES): VITD: 50.79 ng/mL (ref 30.00–100.00)

## 2023-08-18 LAB — HEMOGLOBIN A1C: Hgb A1c MFr Bld: 5.9 % (ref 4.6–6.5)

## 2023-08-18 MED ORDER — CYCLOBENZAPRINE HCL 5 MG PO TABS: 5.0000 mg | ORAL_TABLET | Freq: Three times a day (TID) | ORAL | 1 refills | Status: DC | PRN

## 2023-08-18 NOTE — Patient Instructions (Addendum)
 Welcome to [facility]!  Thank you for choosing [facility] for your Primary Care needs.   We offer in person and video appointments for your convenience. You may call our office to schedule appointments, or you may schedule appointments with me through MyChart.   The best way to get in contact with me is via MyChart message. This will get to me faster than a phone call, unless there is an emergency, then please call 911.  The lab is located downstairs in the Sports Medicine building, we also have xray available there.   I have sent in a muscle relaxer for you to use one tablet as needed for muscle spasms.   This medication can make you sleepy. Do not drive until you know how this medication affects you.   We are checking labs today, will be in contact with any results that require further attention.  Please follow up with me in about 4 months for a physical.

## 2023-08-18 NOTE — Progress Notes (Signed)
 New Patient Office Visit  Subjective    Patient ID: Pamela Welch, female    DOB: 01-17-1966  Age: 58 y.o. MRN: [id number]  CC:  Chief Complaint  Patient presents with   Establish Care    HPI Pamela Welch presents to establish care today. Has history of chronic kidney disease, states is from anti-inflammatory pain medication use for chronic pain when she was younger. Reports compliance with medication regimen. Inquiring about what she can take for muscle pain, states neck and right shoulder are the worst and it is worse at night. Sees chiropractor once weekly.  States this is helpful. She is not fasting today. Declines need for refills today. She is up-to-date on routine screenings. She is up-to-date on routine vaccinations. Denies other concerns today. Medical history as outlined below.   Outpatient Encounter Medications as of 08/18/2023  Medication Sig   buPROPion (WELLBUTRIN XL) 300 MG 24 hr tablet Take  1 tablet    every Morning   for Mood, Focus & Concentration                                                  /                                                                   TAKE                                         BY                                                 MOUTH   cyclobenzaprine (FLEXERIL) 5 MG tablet Take 1 tablet (5 mg total) by mouth 3 (three) times daily as needed for muscle spasms.   estradiol (ESTRACE) 0.1 MG/GM vaginal cream Insert 1 gram vaginally using applicator twice weekly at bedtime.   fluticasone (FLONASE) 50 MCG/ACT nasal spray Place 2 sprays into both nostrils as needed. Reported on 06/27/2015   gabapentin (NEURONTIN) 100 MG capsule Take 1 to 2  capsule(s)  2 - 3 x / day as needed for back pain   lidocaine (XYLOCAINE) 5 % ointment Apply 1 application topically four times daily as needed.   linaclotide (LINZESS) 145 MCG CAPS capsule Take  1 capsule  Daily for IBS - C   nitrofurantoin, macrocrystal-monohydrate, (MACROBID) 100 MG capsule Take 1  capsule Daily with Food for UTI prevention                                /  TAKE                                         BY                                                 MOUTH   pantoprazole (PROTONIX) 40 MG tablet Take 1 tablet 2 x / day  to prevent Indigestion and Heartburn                               /                                                                   TAKE                                         BY                                                 MOUTH   rosuvastatin (CRESTOR) 20 MG tablet Take  1 tablet   Daily   for Cholesterol                                                                      /                                                                   TAKE                                         BY                                                 MOUTH   traZODone (DESYREL) 150 MG tablet Take 2 tablets by mouth 1 hour before bedtime as needed for sleep. **Generic equivalent for Desyrel.**   valACYclovir (VALTREX) 500 MG tablet Take 1 tablet Daily prophylaxis for Cold Sores / Fever Blisters   dexamethasone (DECADRON) 2 MG tablet Take 1 tab 3 x /day for  2 days,      then 2 x /day for 2  Days,     then 1 tab daily (Patient not taking: Reported on 08/18/2023)   doxycycline (VIBRAMYCIN) 100 MG capsule Take 1 capsule 2 x / day with Food for Infection (Patient not taking: Reported on 08/18/2023)   [DISCONTINUED] DULoxetine (CYMBALTA) 30 MG capsule Take 1 capsule by mouth twice daily for chronic back pain. (Patient not taking: Reported on 08/18/2023)   [DISCONTINUED] phentermine (ADIPEX-P) 37.5 MG tablet TAKE 1 TABLET EVERY MORNING FOR DIETING & WEIGHT LOSS (Patient not taking: Reported on 08/18/2023)   [DISCONTINUED] topiramate (TOPAMAX) 50 MG tablet Take 1 tablet by mouth twice daily with supper and at bedtime. (Patient not taking: Reported on 08/18/2023)   No facility-administered encounter medications on file as of  08/18/2023.    Past Medical History:  Diagnosis Date   Allergy    SEASONAL   Anxiety    Back injury    Barrett's esophagus    Chest pain, unspecified    normal myoview 2000;  echo 5/10:  mild LVH, EF 60-65%   DDD (degenerative disc disease)    Depression    GERD (gastroesophageal reflux disease)    Hiatal hernia    HSV-1 (herpes simplex virus 1) infection    Hyperlipidemia    IBS (irritable bowel syndrome)    Insomnia    Lumbago    Melanosis coli    Palpitations    h/o symptomatic PVCs   Palpitations    Persistent disorder of initiating or maintaining sleep    Restless legs syndrome (RLS)     Past Surgical History:  Procedure Laterality Date   COLONOSCOPY  2013   ESOPHAGOGASTRODUODENOSCOPY N/A 03/04/2014   Procedure: ESOPHAGOGASTRODUODENOSCOPY (EGD);  Surgeon: Meryl Dare, MD;  Location: [facility];  Service: Endoscopy;  Laterality: N/A;   EYE SURGERY  2001   REFRACTIVE SURGERY     bilateral eyes    TUBES IN EARS     as an adult   UPPER GASTROINTESTINAL ENDOSCOPY      Family History  Problem Relation Age of Onset   Heart disease Cousin    Lung cancer Cousin    Hypertension Mother    Osteoporosis Mother    Breast cancer Sister 47       BRCA NEG/BILAT MASTECTOMY/CHEMO X 1 YR   Hyperlipidemia Sister    Ovarian cancer Sister    Colon cancer Maternal Grandmother    Stroke Maternal Grandmother    Osteoporosis Maternal Grandmother    Colon cancer Maternal Grandfather    Multiple myeloma Paternal Grandmother    Hypertension Father    Hyperlipidemia Father    Esophageal cancer Neg Hx    Liver cancer Neg Hx    Pancreatic cancer Neg Hx    Rectal cancer Neg Hx    Stomach cancer Neg Hx     Social History   Socioeconomic History   Marital status: Single    Spouse name: Not on file   Number of children: 2   Years of education: Not on file   Highest education level: Not on file  Occupational History   Occupation: cpa    Employer: [employer] [place]    Tobacco Use   Smoking status: Never   Smokeless tobacco: Never  Vaping Use   Vaping status: Never Used  Substance and Sexual Activity   Alcohol use: Yes    Alcohol/week: 1.0 standard drink of alcohol    Types: 1 Standard drinks or  equivalent per week    Comment: Social   Drug use: No   Sexual activity: Yes    Birth control/protection: None, Post-menopausal  Other Topics Concern   Not on file  Social History Narrative   Single.  Lives alone.  2 teenage children.   Social Drivers of Corporate investment banker Strain: Not on file  Food Insecurity: Not on file  Transportation Needs: Not on file  Physical Activity: Not on file  Stress: Not on file  Social Connections: Not on file  Intimate Partner Violence: Not on file    ROS Per HPI      Objective    BP 138/88 (BP Location: Left Arm, Patient Position: Sitting)   Pulse 97   Temp 98.6 F (37 C) (Temporal)   Ht 5\' 9"  (1.753 m)   Wt 177 lb 3.2 oz (80.4 kg)   LMP 12/26/2015   SpO2 96%   BMI 26.17 kg/m   Physical Exam Vitals and nursing note reviewed.  Constitutional:      General: She is not in acute distress.    Appearance: Normal appearance. She is normal weight.  HENT:     Head: Normocephalic and atraumatic.     Nose: Nose normal.  Eyes:     Extraocular Movements: Extraocular movements intact.     Pupils: Pupils are equal, round, and reactive to light.  Cardiovascular:     Rate and Rhythm: Normal rate and regular rhythm.     Heart sounds: Normal heart sounds.  Pulmonary:     Effort: Pulmonary effort is normal. No respiratory distress.     Breath sounds: Normal breath sounds. No wheezing, rhonchi or rales.  Musculoskeletal:     Comments: LROM to neck and R shoulder. No erythema, no bruising, no obvious deformity  Lymphadenopathy:     Cervical: No cervical adenopathy.  Neurological:     General: No focal deficit present.     Mental Status: She is alert and oriented to person, place, and time.   Psychiatric:        Mood and Affect: Mood normal.        Thought Content: Thought content normal.         Assessment & Plan:   Stage 3a chronic kidney disease (HCC) Assessment & Plan: Checking kidney function today, maintain hydration Continue to avoid NSAIDs and ibuprofen  Orders: -     CBC with Differential/Platelet -     Comprehensive metabolic panel -     Vitamin B12  Vitamin D deficiency -     VITAMIN D 25 Hydroxy (Vit-D Deficiency, Fractures) -     Vitamin B12  HSV-1 (herpes simplex virus 1) infection  Generalized anxiety disorder -     Vitamin B12  Paradoxical insomnia  Chronic constipation Assessment & Plan: Continue Linzess   Mixed hyperlipidemia -     Lipid panel  Abnormal blood sugar -     CBC with Differential/Platelet -     Comprehensive metabolic panel -     Hemoglobin A1c  Recurrent UTI Assessment & Plan: Continue Macrobid as needed  Orders: -     Urinalysis, Routine w reflex microscopic; Future  Neck pain -     Cyclobenzaprine HCl; Take 1 tablet (5 mg total) by mouth 3 (three) times daily as needed for muscle spasms.  Dispense: 30 tablet; Refill: 1  Continue chiropractor scheduled  Return in about 1 month (around 09/18/2023) for physical.   Moshe Cipro, FNP

## 2023-08-18 NOTE — Assessment & Plan Note (Signed)
 Continue Linzess.

## 2023-08-18 NOTE — Assessment & Plan Note (Signed)
 Checking kidney function today, maintain hydration Continue to avoid NSAIDs and ibuprofen

## 2023-08-18 NOTE — Assessment & Plan Note (Signed)
Continue Macrobid as needed 

## 2023-08-19 ENCOUNTER — Encounter: Payer: Self-pay | Admitting: Family Medicine

## 2023-08-19 LAB — URINALYSIS, ROUTINE W REFLEX MICROSCOPIC
Bilirubin Urine: NEGATIVE
Hgb urine dipstick: NEGATIVE
Ketones, ur: NEGATIVE
Leukocytes,Ua: NEGATIVE
Nitrite: NEGATIVE
RBC / HPF: NONE SEEN (ref 0–?)
Specific Gravity, Urine: <=1.005 — AB (ref 1.000–1.030)
Total Protein, Urine: NEGATIVE
Urine Glucose: NEGATIVE
Urobilinogen, UA: 0.2 (ref 0.0–1.0)
WBC, UA: NONE SEEN (ref 0–?)
pH: 6 (ref 5.0–8.0)

## 2023-08-24 ENCOUNTER — Other Ambulatory Visit: Payer: Self-pay | Admitting: Family Medicine

## 2023-08-24 ENCOUNTER — Encounter: Payer: Self-pay | Admitting: Family Medicine

## 2023-08-24 DIAGNOSIS — M542 Cervicalgia: Secondary | ICD-10-CM

## 2023-08-24 DIAGNOSIS — G4700 Insomnia, unspecified: Secondary | ICD-10-CM

## 2023-08-24 DIAGNOSIS — G2581 Restless legs syndrome: Secondary | ICD-10-CM

## 2023-08-24 DIAGNOSIS — M545 Low back pain, unspecified: Secondary | ICD-10-CM

## 2023-08-25 ENCOUNTER — Other Ambulatory Visit: Payer: Self-pay

## 2023-08-25 MED ORDER — GABAPENTIN 300 MG PO CAPS: 300.0000 mg | ORAL_CAPSULE | Freq: Three times a day (TID) | ORAL | 1 refills | Status: DC

## 2023-09-17 ENCOUNTER — Other Ambulatory Visit: Payer: Self-pay | Admitting: Family Medicine

## 2023-09-17 DIAGNOSIS — M542 Cervicalgia: Secondary | ICD-10-CM

## 2023-09-17 DIAGNOSIS — K581 Irritable bowel syndrome with constipation: Secondary | ICD-10-CM

## 2023-09-24 DIAGNOSIS — Z1231 Encounter for screening mammogram for malignant neoplasm of breast: Secondary | ICD-10-CM | POA: Diagnosis not present

## 2023-09-24 LAB — HM MAMMOGRAPHY

## 2023-09-26 ENCOUNTER — Encounter: Payer: Self-pay | Admitting: Family Medicine

## 2023-09-26 DIAGNOSIS — F5101 Primary insomnia: Secondary | ICD-10-CM

## 2023-10-01 MED ORDER — ZOLPIDEM TARTRATE ER 6.25 MG PO TBCR: 6.2500 mg | EXTENDED_RELEASE_TABLET | Freq: Every evening | ORAL | 2 refills | Status: DC | PRN

## 2023-10-08 ENCOUNTER — Telehealth (INDEPENDENT_AMBULATORY_CARE_PROVIDER_SITE_OTHER): Payer: Self-pay | Admitting: Otolaryngology

## 2023-10-08 NOTE — Telephone Encounter (Signed)
 Patient called in wanting to make an appointment because "the tube fell out and I want it put back in".  Informed patient that we would need to schedule a follow-up appointment for Dr. Darlin Ehrlich to see if the tube did come out, and if it did, we would then schedule a procedure appointment to place a tube in the ear.  Patient was not happy with that answer and wanted to do both at the same appointment.  She asked for me to send a message to Dr. Darlin Ehrlich to ask if she can do it that way.  Please advise.   [phone]

## 2023-10-14 ENCOUNTER — Ambulatory Visit (INDEPENDENT_AMBULATORY_CARE_PROVIDER_SITE_OTHER): Admitting: Family Medicine

## 2023-10-14 ENCOUNTER — Encounter: Payer: Self-pay | Admitting: Family Medicine

## 2023-10-14 VITALS — BP 142/80 | HR 84 | Temp 98.6°F | Ht 69.0 in | Wt 179.8 lb

## 2023-10-14 DIAGNOSIS — E785 Hyperlipidemia, unspecified: Secondary | ICD-10-CM

## 2023-10-14 DIAGNOSIS — Z124 Encounter for screening for malignant neoplasm of cervix: Secondary | ICD-10-CM

## 2023-10-14 DIAGNOSIS — R7309 Other abnormal glucose: Secondary | ICD-10-CM

## 2023-10-14 DIAGNOSIS — I10 Essential (primary) hypertension: Secondary | ICD-10-CM

## 2023-10-14 DIAGNOSIS — Z79899 Other long term (current) drug therapy: Secondary | ICD-10-CM

## 2023-10-14 DIAGNOSIS — Z0000 Encounter for general adult medical examination without abnormal findings: Secondary | ICD-10-CM | POA: Diagnosis not present

## 2023-10-14 DIAGNOSIS — G4733 Obstructive sleep apnea (adult) (pediatric): Secondary | ICD-10-CM | POA: Diagnosis not present

## 2023-10-14 DIAGNOSIS — N1831 Chronic kidney disease, stage 3a: Secondary | ICD-10-CM

## 2023-10-14 DIAGNOSIS — Z23 Encounter for immunization: Secondary | ICD-10-CM | POA: Diagnosis not present

## 2023-10-14 DIAGNOSIS — E559 Vitamin D deficiency, unspecified: Secondary | ICD-10-CM

## 2023-10-14 DIAGNOSIS — K2100 Gastro-esophageal reflux disease with esophagitis, without bleeding: Secondary | ICD-10-CM

## 2023-10-14 DIAGNOSIS — F5101 Primary insomnia: Secondary | ICD-10-CM

## 2023-10-14 LAB — CBC WITH DIFFERENTIAL/PLATELET
Basophils Absolute: 0.1 10*3/uL (ref 0.0–0.1)
Basophils Relative: 1 % (ref 0.0–3.0)
Eosinophils Absolute: 0.2 10*3/uL (ref 0.0–0.7)
Eosinophils Relative: 2.5 % (ref 0.0–5.0)
HCT: 39.4 % (ref 36.0–46.0)
Hemoglobin: 13.4 g/dL (ref 12.0–15.0)
Lymphocytes Relative: 33.9 % (ref 12.0–46.0)
Lymphs Abs: 2.6 10*3/uL (ref 0.7–4.0)
MCHC: 33.9 g/dL (ref 30.0–36.0)
MCV: 86.4 fl (ref 78.0–100.0)
Monocytes Absolute: 0.4 10*3/uL (ref 0.1–1.0)
Monocytes Relative: 5.4 % (ref 3.0–12.0)
Neutro Abs: 4.4 10*3/uL (ref 1.4–7.7)
Neutrophils Relative %: 57.2 % (ref 43.0–77.0)
Platelets: 330 10*3/uL (ref 150.0–400.0)
RBC: 4.56 Mil/uL (ref 3.87–5.11)
RDW: 12.2 % (ref 11.5–15.5)
WBC: 7.7 10*3/uL (ref 4.0–10.5)

## 2023-10-14 LAB — HEMOGLOBIN A1C: Hgb A1c MFr Bld: 5.8 % (ref 4.6–6.5)

## 2023-10-14 MED ORDER — LOSARTAN POTASSIUM 25 MG PO TABS: 25.0000 mg | ORAL_TABLET | Freq: Every day | ORAL | 1 refills | Status: DC

## 2023-10-14 MED ORDER — ZOLPIDEM TARTRATE ER 12.5 MG PO TBCR: 12.5000 mg | EXTENDED_RELEASE_TABLET | Freq: Every evening | ORAL | 1 refills | Status: DC | PRN

## 2023-10-14 NOTE — Patient Instructions (Signed)
 We have completed your physical today.   EKG was normal today.  I have sent in losartan 25mg  once daily to treat your blood pressure.   Check BP at home once daily in the mornings for the next month.   Please send readings to me on mychart.  We have updated your Tdap vaccine today.  We are checking labs today, will be in contact with any results that require further attention.  Follow up with me in about 3 months for labs and medication management, sooner if needed.

## 2023-10-14 NOTE — Progress Notes (Unsigned)
 Complete physical exam  Patient: Pamela Welch   DOB: 1965/10/15   58 y.o. Female  MRN: [id number]  Subjective:     Chief Complaint  Patient presents with   Annual Exam    JENNILEE DEMARCO is a 58 y.o. female who presents today for a complete physical exam. She reports consuming a general diet. Home exercise routine includes walking for 45 mins 5 times per week. She generally feels well. She reports sleeping well. She does have additional problems to discuss today.   Would like to discuss blood pressures today.  They have been higher than usual. Home BPs: Not currently on antihypertensives.     Most recent fall risk assessment:    08/18/2023    2:36 PM  Fall Risk   Number falls in past yr: 0  Injury with Fall? 0     Most recent depression screenings:    10/14/2023    1:45 PM 06/09/2023    1:08 AM  PHQ 2/9 Scores  PHQ - 2 Score 0 0    Vision:Within last year and Dental: No current dental problems and Receives regular dental care  Patient Active Problem List   Diagnosis Date Noted   Recurrent UTI 08/18/2023   Chronic constipation 06/09/2018   Overweight (BMI 25.0-29.9) 03/04/2018   CKD (chronic kidney disease) stage 3, GFR 30-59 ml/min 06/30/2015   Memory loss 06/27/2015   Chronic daily headache 06/27/2015   Generalized anxiety disorder 06/27/2015   Labile hypertension 12/22/2014   Vitamin D  deficiency 04/27/2014   Abnormal blood sugar 04/27/2014   IBS (irritable colon syndrome) 04/27/2014   GERD (gastroesophageal reflux disease) 03/03/2014   Barrett's esophagus 03/03/2014   Lumbago    Insomnia    HSV-1 (herpes simplex virus 1) infection    DDD (degenerative disc disease)    Hyperlipidemia    OSA (obstructive sleep apnea) 05/19/2008   RESTLESS LEGS SYNDROME 05/19/2008   HNP (herniated nucleus pulposus), lumbar 05/19/2008   Past Medical History:  Diagnosis Date   Allergy    SEASONAL   Anxiety    Back injury    Barrett's esophagus    Chest pain,  unspecified    normal myoview  2000;  echo 5/10:  mild LVH, EF 60-65%   DDD (degenerative disc disease)    Depression    GERD (gastroesophageal reflux disease)    Hiatal hernia    HSV-1 (herpes simplex virus 1) infection    Hyperlipidemia    IBS (irritable bowel syndrome)    Insomnia    Lumbago    Melanosis coli    Palpitations    h/o symptomatic PVCs   Palpitations    Persistent disorder of initiating or maintaining sleep    Restless legs syndrome (RLS)    Social History   Tobacco Use   Smoking status: Never   Smokeless tobacco: Never  Vaping Use   Vaping status: Never Used  Substance Use Topics   Alcohol use: Yes    Alcohol/week: 1.0 standard drink of alcohol    Types: 1 Standard drinks or equivalent per week    Comment: Social   Drug use: No   Allergies  Allergen Reactions   Atenolol Other (See Comments)    Made patient feel like a "zombie"   Ambien  [Zolpidem  Tartrate]     Memory issues   Mirapex [Pramipexole Dihydrochloride]     Hot flashes   Octacosanol     Hot flashes   Requip [Ropinirole Hcl]     Hot  flashes      Patient Care Team: Wellington Half, FNP as PCP - General (Family Medicine)   Outpatient Medications Prior to Visit  Medication Sig   buPROPion  (WELLBUTRIN  XL) 300 MG 24 hr tablet Take  1 tablet    every Morning   for Mood, Focus & Concentration                                                  /                                                                   TAKE                                         BY                                                 MOUTH   cyclobenzaprine  (FLEXERIL ) 5 MG tablet Take 1 tablet by mouth three times daily as needed for muscle spasms.   estradiol  (ESTRACE ) 0.1 MG/GM vaginal cream Insert 1 gram vaginally using applicator twice weekly at bedtime.   fluticasone (FLONASE) 50 MCG/ACT nasal spray Place 2 sprays into both nostrils as needed. Reported on 06/27/2015   gabapentin  (NEURONTIN ) 300 MG capsule Take 1  capsule (300 mg total) by mouth 3 (three) times daily.   lidocaine  (XYLOCAINE ) 5 % ointment Apply 1 application topically four times daily as needed.   LINZESS  145 MCG CAPS capsule Take 1 capsule by mouth daily for IBS - C.   nitrofurantoin , macrocrystal-monohydrate, (MACROBID ) 100 MG capsule Take 1 capsule Daily with Food for UTI prevention                                /                                                                   TAKE                                         BY                                                 MOUTH   pantoprazole  (PROTONIX ) 40 MG tablet Take 1 tablet 2 x / day  to prevent Indigestion and Heartburn                               /  TAKE                                         BY                                                 MOUTH   rosuvastatin  (CRESTOR ) 20 MG tablet Take  1 tablet   Daily   for Cholesterol                                                                      /                                                                   TAKE                                         BY                                                 MOUTH   traZODone  (DESYREL ) 150 MG tablet Take 2 tablets by mouth 1 hour before bedtime as needed for sleep. Generic equivalent for Desyrel .   valACYclovir  (VALTREX ) 500 MG tablet Take 1 tablet Daily prophylaxis for Cold Sores / Fever Blisters   [DISCONTINUED] dexamethasone  (DECADRON ) 2 MG tablet Take 1 tab 3 x /day for 2 days,      then 2 x /day for 2  Days,     then 1 tab daily (Patient not taking: Reported on 10/14/2023)   [DISCONTINUED] doxycycline  (VIBRAMYCIN ) 100 MG capsule Take 1 capsule 2 x / day with Food for Infection (Patient not taking: Reported on 10/14/2023)   [DISCONTINUED] zolpidem  (AMBIEN  CR) 6.25 MG CR tablet Take 1 tablet (6.25 mg total) by mouth at bedtime as needed for sleep.   No facility-administered medications prior to visit.    ROS         Objective:     BP (!) 142/80 (BP Location: Left Arm, Patient Position: Sitting)   Pulse 84   Temp 98.6 F (37 C) (Temporal)   Ht 5\' 9"  (1.753 m)   Wt 179 lb 12.8 oz (81.6 kg)   LMP 12/26/2015   SpO2 98%   BMI 26.55 kg/m  BP Readings from Last 3 Encounters:  10/14/23 (!) 142/80  08/18/23 138/88  05/19/23 (!) 140/88   Wt Readings from Last 3 Encounters:  10/14/23 179 lb 12.8 oz (81.6 kg)  08/18/23 177 lb 3.2 oz (80.4 kg)  05/19/23 176 lb 12.8 oz (80.2 kg)   SpO2  Readings from Last 3 Encounters:  10/14/23 98%  08/18/23 96%  05/19/23 99%      Physical Exam Vitals and nursing note reviewed.  Constitutional:      General: She is not in acute distress.    Appearance: Normal appearance.  HENT:     Head: Normocephalic.     Right Ear: Tympanic membrane and ear canal normal.     Left Ear: Tympanic membrane and ear canal normal.     Nose: Nose normal.     Mouth/Throat:     Mouth: Mucous membranes are moist.     Pharynx: Oropharynx is clear.  Eyes:     Extraocular Movements: Extraocular movements intact.     Conjunctiva/sclera: Conjunctivae normal.     Pupils: Pupils are equal, round, and reactive to light.  Neck:     Thyroid : No thyromegaly.     Vascular: No carotid bruit.  Cardiovascular:     Rate and Rhythm: Normal rate and regular rhythm.     Pulses: Normal pulses.     Heart sounds: Murmur heard.  Pulmonary:     Effort: Pulmonary effort is normal.     Breath sounds: Normal breath sounds.  Abdominal:     General: Abdomen is flat. Bowel sounds are normal.     Palpations: Abdomen is soft.  Musculoskeletal:        General: Normal range of motion.     Cervical back: Normal range of motion and neck supple.  Lymphadenopathy:     Cervical: No cervical adenopathy.  Skin:    General: Skin is warm and dry.     Capillary Refill: Capillary refill takes less than 2 seconds.  Neurological:     General: No focal deficit present.     Mental Status: She is alert and  oriented to person, place, and time.  Psychiatric:        Mood and Affect: Mood normal.        Behavior: Behavior normal.      No results found for any visits on 10/14/23. Last CBC Lab Results  Component Value Date   WBC 8.6 08/18/2023   HGB 13.4 08/18/2023   HCT 39.9 08/18/2023   MCV 87.8 08/18/2023   MCH 30.7 06/19/2022   RDW 13.2 08/18/2023   PLT 348.0 08/18/2023   Last metabolic panel Lab Results  Component Value Date   GLUCOSE 97 08/18/2023   NA 136 08/18/2023   K 5.0 08/18/2023   CL 98 08/18/2023   CO2 31 08/18/2023   BUN 11 08/18/2023   CREATININE 1.09 08/18/2023   GFR 55.88 (L) 08/18/2023   CALCIUM  9.6 08/18/2023   PROT 7.6 08/18/2023   ALBUMIN 4.5 08/18/2023   BILITOT 0.4 08/18/2023   ALKPHOS 103 08/18/2023   AST 18 08/18/2023   ALT 12 08/18/2023   ANIONGAP 10 03/05/2014   Last lipids Lab Results  Component Value Date   CHOL 202 (H) 08/18/2023   HDL 63.40 08/18/2023   LDLCALC 93 08/18/2023   TRIG 228.0 (H) 08/18/2023   CHOLHDL 3 08/18/2023   Last hemoglobin A1c Lab Results  Component Value Date   HGBA1C 5.9 08/18/2023   Last thyroid  functions Lab Results  Component Value Date   TSH 2.09 03/21/2022   Last vitamin D  Lab Results  Component Value Date   VD25OH 50.79 08/18/2023   Last vitamin B12 and Folate Lab Results  Component Value Date   VITAMINB12 476 08/18/2023         Assessment &  Plan:    Routine Health Maintenance and Physical Exam   Health Maintenance  Topic Date Due   Pneumococcal Vaccination (1 of 2 - PCV) 10/27/1983   Zoster (Shingles) Vaccine (2 of 2) 06/13/2020   DTaP/Tdap/Td vaccine (3 - Td or Tdap) 04/08/2022   COVID-19 Vaccine (5 - 2024-25 season) 01/26/2023   Pap with HPV screening  06/04/2023   Mammogram  06/18/2023   Flu Shot  12/26/2023   Colon Cancer Screening  04/15/2024   Hepatitis C Screening  Completed   HIV Screening  Completed   HPV Vaccine  Aged Out   Meningitis B Vaccine  Aged Out      Discussed health benefits of physical activity, and encouraged her to engage in regular exercise appropriate for her age and condition.     Well adult exam  OSA (obstructive sleep apnea)  Gastroesophageal reflux disease with esophagitis, unspecified whether hemorrhage  Hyperlipidemia, unspecified hyperlipidemia type  Vitamin D  deficiency  CKD stage 3a, GFR 45-59 ml/min (HCC) -     CBC with Differential/Platelet -     Comprehensive metabolic panel with GFR -     Losartan Potassium; Take 1 tablet (25 mg total) by mouth daily.  Dispense: 90 tablet; Refill: 1 -     Urinalysis, Routine w reflex microscopic; Future  Abnormal glucose -     Hemoglobin A1c -     Urinalysis, Routine w reflex microscopic; Future  Medication management -     Urinalysis, Routine w reflex microscopic; Future  Primary insomnia -     Zolpidem  Tartrate ER; Take 1 tablet (12.5 mg total) by mouth at bedtime as needed for sleep.  Dispense: 90 tablet; Refill: 1  Primary hypertension -     Losartan Potassium; Take 1 tablet (25 mg total) by mouth daily.  Dispense: 90 tablet; Refill: 1 -     EKG 12-Lead  Cervical cancer screening -     Ambulatory referral to Obstetrics / Gynecology     No follow-ups on file.     Wellington Half, FNP

## 2023-10-15 LAB — URINALYSIS, ROUTINE W REFLEX MICROSCOPIC
Bilirubin Urine: NEGATIVE
Hgb urine dipstick: NEGATIVE
Ketones, ur: NEGATIVE
Leukocytes,Ua: NEGATIVE
Nitrite: NEGATIVE
RBC / HPF: NONE SEEN (ref 0–?)
Specific Gravity, Urine: <=1.005 — AB (ref 1.000–1.030)
Total Protein, Urine: NEGATIVE
Urine Glucose: NEGATIVE
Urobilinogen, UA: 0.2 (ref 0.0–1.0)
WBC, UA: NONE SEEN (ref 0–?)
pH: 6 (ref 5.0–8.0)

## 2023-10-15 LAB — COMPREHENSIVE METABOLIC PANEL WITH GFR
ALT: 13 U/L (ref 0–35)
AST: 20 U/L (ref 0–37)
Albumin: 4.3 g/dL (ref 3.5–5.2)
Alkaline Phosphatase: 100 U/L (ref 39–117)
BUN: 13 mg/dL (ref 6–23)
CO2: 28 meq/L (ref 19–32)
Calcium: 9.1 mg/dL (ref 8.4–10.5)
Chloride: 98 meq/L (ref 96–112)
Creatinine, Ser: 1.02 mg/dL (ref 0.40–1.20)
GFR: 60.45 mL/min (ref >60.00)
Glucose, Bld: 82 mg/dL (ref 70–99)
Potassium: 4.3 meq/L (ref 3.5–5.1)
Sodium: 136 meq/L (ref 135–145)
Total Bilirubin: 0.3 mg/dL (ref 0.2–1.2)
Total Protein: 7.3 g/dL (ref 6.0–8.3)

## 2023-10-17 ENCOUNTER — Encounter: Payer: Self-pay | Admitting: Family Medicine

## 2023-10-19 ENCOUNTER — Ambulatory Visit: Payer: Self-pay | Admitting: Family Medicine

## 2023-10-19 ENCOUNTER — Encounter: Payer: Self-pay | Admitting: Family Medicine

## 2023-10-19 DIAGNOSIS — N1831 Chronic kidney disease, stage 3a: Secondary | ICD-10-CM | POA: Insufficient documentation

## 2023-10-19 DIAGNOSIS — R7309 Other abnormal glucose: Secondary | ICD-10-CM | POA: Insufficient documentation

## 2023-10-19 DIAGNOSIS — I10 Essential (primary) hypertension: Secondary | ICD-10-CM | POA: Insufficient documentation

## 2023-10-19 DIAGNOSIS — Z0000 Encounter for general adult medical examination without abnormal findings: Secondary | ICD-10-CM | POA: Insufficient documentation

## 2023-10-19 DIAGNOSIS — Z124 Encounter for screening for malignant neoplasm of cervix: Secondary | ICD-10-CM | POA: Insufficient documentation

## 2023-10-19 DIAGNOSIS — Z23 Encounter for immunization: Secondary | ICD-10-CM | POA: Insufficient documentation

## 2023-10-19 NOTE — Assessment & Plan Note (Signed)
 Urinalysis, CMP today Maintain hydration, avoid NSAIDs

## 2023-10-19 NOTE — Assessment & Plan Note (Signed)
 Discussed age-related preventative care, screenings and vaccines Tdap today

## 2023-10-19 NOTE — Assessment & Plan Note (Signed)
 Continue CPAP, stable

## 2023-10-19 NOTE — Assessment & Plan Note (Signed)
 Start losartan 25 mg once daily Hypertension handout attached Discussed DASH diet and activity level to help decrease blood pressure Discussed monitoring blood pressures and sending readings to me on MyChart

## 2023-10-19 NOTE — Assessment & Plan Note (Signed)
 CMP, A1c Discussed low sweets, low-carb diet with healthy activity level to help stabilize blood sugars

## 2023-10-19 NOTE — Assessment & Plan Note (Signed)
 Stable, labs today

## 2023-10-19 NOTE — Assessment & Plan Note (Signed)
 May increase Ambien  CR to 12 mg once daily at bedtime New prescription sent today

## 2023-10-19 NOTE — Assessment & Plan Note (Signed)
 Tdap given today.

## 2023-10-19 NOTE — Assessment & Plan Note (Signed)
 Stable

## 2023-10-19 NOTE — Assessment & Plan Note (Signed)
 Labs today, will dose adjust as needed

## 2023-10-19 NOTE — Assessment & Plan Note (Signed)
 Referral to gynecology today

## 2023-10-21 ENCOUNTER — Encounter (INDEPENDENT_AMBULATORY_CARE_PROVIDER_SITE_OTHER): Payer: Self-pay | Admitting: Otolaryngology

## 2023-10-21 ENCOUNTER — Ambulatory Visit (INDEPENDENT_AMBULATORY_CARE_PROVIDER_SITE_OTHER): Admitting: Otolaryngology

## 2023-10-21 VITALS — BP 128/90 | HR 78

## 2023-10-21 DIAGNOSIS — H6983 Other specified disorders of Eustachian tube, bilateral: Secondary | ICD-10-CM

## 2023-10-21 DIAGNOSIS — H7203 Central perforation of tympanic membrane, bilateral: Secondary | ICD-10-CM

## 2023-10-21 DIAGNOSIS — R0981 Nasal congestion: Secondary | ICD-10-CM

## 2023-10-21 DIAGNOSIS — J310 Chronic rhinitis: Secondary | ICD-10-CM | POA: Diagnosis not present

## 2023-10-21 DIAGNOSIS — H6123 Impacted cerumen, bilateral: Secondary | ICD-10-CM | POA: Diagnosis not present

## 2023-10-22 DIAGNOSIS — H6123 Impacted cerumen, bilateral: Secondary | ICD-10-CM | POA: Insufficient documentation

## 2023-10-22 DIAGNOSIS — J310 Chronic rhinitis: Secondary | ICD-10-CM | POA: Insufficient documentation

## 2023-10-22 DIAGNOSIS — H6983 Other specified disorders of Eustachian tube, bilateral: Secondary | ICD-10-CM | POA: Insufficient documentation

## 2023-10-22 DIAGNOSIS — H7203 Central perforation of tympanic membrane, bilateral: Secondary | ICD-10-CM | POA: Insufficient documentation

## 2023-10-22 NOTE — Progress Notes (Signed)
 Patient ID: Pamela Welch, female   DOB: Dec 16, 1965, 58 y.o.   MRN: [id number]  Follow-up: Bilateral eustachian tube dysfunction  HPI: The patient is a 58 year old female who returns today complaining of clogging sensation in her ears.  The patient has a history of bilateral eustachian tube dysfunction.  She was previously treated with bilateral myringotomy and tube placement.  Her right tube was replaced in September 2024.  According to the patient, she has noted increasing clogging sensation in her ears for the past 3 to 4 weeks.  She denies any significant otalgia or otorrhea.  She also denies any recent change in her hearing.  Exam: General: Communicates without difficulty, well nourished, no acute distress. Head: Normocephalic, no evidence injury, no tenderness, facial buttresses intact without stepoff. Face/sinus: No tenderness to palpation and percussion. Facial movement is normal and symmetric. Eyes: PERRL, EOMI. No scleral icterus, conjunctivae clear. Neuro: CN II exam reveals vision grossly intact.  No nystagmus at any point of gaze. Ears: Auricles well formed without lesions.  Bilateral cerumen impaction.  Nose: External evaluation reveals normal support and skin without lesions.  Dorsum is intact.  Anterior rhinoscopy reveals congested mucosa over anterior aspect of inferior turbinates and intact septum.  No purulence noted. Oral:  Oral cavity and oropharynx are intact, symmetric, without erythema or edema.  Mucosa is moist without lesions. Neck: Full range of motion without pain.  There is no significant lymphadenopathy.  No masses palpable.  Thyroid  bed within normal limits to palpation.  Parotid glands and submandibular glands equal bilaterally without mass.  Trachea is midline. Neuro:  CN 2-12 grossly intact.   Procedure: Bilateral cerumen disimpaction Anesthesia: None Description: Under the operating microscope, the cerumen is carefully removed with a combination of cerumen currette,  alligator forceps, and suction catheters.  After the cerumen is removed, both ventilating tubes are noted to be in place and patent.  No mass, erythema, or lesions. The patient tolerated the procedure well.   Assessment: 1.  Bilateral cerumen impaction.  After the cerumen disimpaction procedure, both tubes are noted to be in place and patent. 2.  Bilateral eustachian tube dysfunction. 3.  Chronic rhinitis with nasal mucosal congestion.  Plan: 1.  Otomicroscopy with bilateral cerumen disimpaction. 2.  The physical exam findings are reviewed with the patient. 3.  Flonase nasal spray 2 sprays each nostril daily. 4.  Valsalva exercise multiple times a day. 5.  The patient is encouraged to call with any questions or concerns.

## 2023-10-28 ENCOUNTER — Other Ambulatory Visit: Payer: Self-pay | Admitting: Family Medicine

## 2023-10-28 DIAGNOSIS — M542 Cervicalgia: Secondary | ICD-10-CM

## 2023-10-28 DIAGNOSIS — G4700 Insomnia, unspecified: Secondary | ICD-10-CM

## 2023-10-31 ENCOUNTER — Other Ambulatory Visit: Payer: Self-pay | Admitting: Family

## 2023-10-31 DIAGNOSIS — K2100 Gastro-esophageal reflux disease with esophagitis, without bleeding: Secondary | ICD-10-CM

## 2023-11-05 ENCOUNTER — Encounter: Payer: Self-pay | Admitting: Family Medicine

## 2023-11-06 ENCOUNTER — Other Ambulatory Visit: Payer: Self-pay

## 2023-11-06 DIAGNOSIS — K2100 Gastro-esophageal reflux disease with esophagitis, without bleeding: Secondary | ICD-10-CM

## 2023-11-06 MED ORDER — BUPROPION HCL ER (XL) 300 MG PO TB24: ORAL_TABLET | ORAL | 1 refills | Status: DC

## 2023-11-21 ENCOUNTER — Encounter: Payer: Self-pay | Admitting: Family Medicine

## 2023-11-21 DIAGNOSIS — E66812 Obesity, class 2: Secondary | ICD-10-CM

## 2023-11-21 MED ORDER — PHENTERMINE HCL 37.5 MG PO TABS: 37.5000 mg | ORAL_TABLET | Freq: Every day | ORAL | 0 refills | Status: DC

## 2024-01-19 ENCOUNTER — Ambulatory Visit: Admitting: Family Medicine

## 2024-01-20 ENCOUNTER — Other Ambulatory Visit: Payer: Self-pay | Admitting: Family Medicine

## 2024-01-20 DIAGNOSIS — G4700 Insomnia, unspecified: Secondary | ICD-10-CM

## 2024-01-27 ENCOUNTER — Encounter: Payer: Self-pay | Admitting: Family Medicine

## 2024-02-04 ENCOUNTER — Encounter: Payer: Self-pay | Admitting: Family Medicine

## 2024-02-04 ENCOUNTER — Other Ambulatory Visit: Payer: Self-pay

## 2024-02-04 DIAGNOSIS — N952 Postmenopausal atrophic vaginitis: Secondary | ICD-10-CM

## 2024-02-04 MED ORDER — ESTRADIOL 0.1 MG/GM VA CREA: TOPICAL_CREAM | VAGINAL | 3 refills | Status: AC

## 2024-02-05 ENCOUNTER — Other Ambulatory Visit: Payer: Self-pay

## 2024-02-05 DIAGNOSIS — K2100 Gastro-esophageal reflux disease with esophagitis, without bleeding: Secondary | ICD-10-CM

## 2024-02-05 DIAGNOSIS — E782 Mixed hyperlipidemia: Secondary | ICD-10-CM

## 2024-02-05 MED ORDER — PANTOPRAZOLE SODIUM 40 MG PO TBEC: DELAYED_RELEASE_TABLET | ORAL | 3 refills | Status: AC

## 2024-02-05 MED ORDER — ROSUVASTATIN CALCIUM 20 MG PO TABS: ORAL_TABLET | ORAL | 3 refills | Status: AC

## 2024-02-17 ENCOUNTER — Other Ambulatory Visit: Payer: Self-pay | Admitting: Family Medicine

## 2024-02-17 DIAGNOSIS — M542 Cervicalgia: Secondary | ICD-10-CM

## 2024-02-17 DIAGNOSIS — G8929 Other chronic pain: Secondary | ICD-10-CM

## 2024-02-17 DIAGNOSIS — G2581 Restless legs syndrome: Secondary | ICD-10-CM

## 2024-02-27 ENCOUNTER — Telehealth: Payer: Self-pay

## 2024-02-27 ENCOUNTER — Other Ambulatory Visit (HOSPITAL_COMMUNITY): Payer: Self-pay

## 2024-02-27 NOTE — Telephone Encounter (Signed)
 Pharmacy Patient Advocate Encounter   Received notification from Onbase that prior authorization for Linzess  capsules is required/requested.   Insurance verification completed.   The patient is insured through [facility].   Per test claim: PA required; PA submitted to above mentioned insurance via Latent Key/confirmation #/EOC [facility] Status is pending

## 2024-03-01 ENCOUNTER — Other Ambulatory Visit (HOSPITAL_COMMUNITY): Payer: Self-pay

## 2024-03-01 NOTE — Telephone Encounter (Signed)
 Pharmacy Patient Advocate Encounter  Received notification from [facility] that Prior Authorization for Linzess  capsules  has been APPROVED from 02/27/24 to 02/26/25. Unable to obtain price due to refill too soon rejection, last fill date 02/28/24 next available fill date12/25/25   PA #/Case ID/Reference #: [id number]

## 2024-03-06 ENCOUNTER — Encounter: Payer: Self-pay | Admitting: Family Medicine

## 2024-03-08 ENCOUNTER — Other Ambulatory Visit: Payer: Self-pay

## 2024-03-08 DIAGNOSIS — K2270 Barrett's esophagus without dysplasia: Secondary | ICD-10-CM

## 2024-03-08 DIAGNOSIS — K2100 Gastro-esophageal reflux disease with esophagitis, without bleeding: Secondary | ICD-10-CM

## 2024-03-08 NOTE — Telephone Encounter (Signed)
 Can we check on that? If she hasn't had one, we can send her to GI

## 2024-03-09 ENCOUNTER — Ambulatory Visit: Admitting: Family Medicine

## 2024-03-09 ENCOUNTER — Ambulatory Visit: Attending: Family Medicine

## 2024-03-09 ENCOUNTER — Encounter: Payer: Self-pay | Admitting: Family Medicine

## 2024-03-09 VITALS — BP 132/90 | HR 88 | Temp 97.6°F | Ht 69.0 in | Wt 170.0 lb

## 2024-03-09 DIAGNOSIS — Z23 Encounter for immunization: Secondary | ICD-10-CM | POA: Diagnosis not present

## 2024-03-09 DIAGNOSIS — R5383 Other fatigue: Secondary | ICD-10-CM | POA: Diagnosis not present

## 2024-03-09 DIAGNOSIS — R002 Palpitations: Secondary | ICD-10-CM | POA: Diagnosis not present

## 2024-03-09 DIAGNOSIS — K2270 Barrett's esophagus without dysplasia: Secondary | ICD-10-CM | POA: Diagnosis not present

## 2024-03-09 DIAGNOSIS — F411 Generalized anxiety disorder: Secondary | ICD-10-CM

## 2024-03-09 DIAGNOSIS — E559 Vitamin D deficiency, unspecified: Secondary | ICD-10-CM

## 2024-03-09 DIAGNOSIS — I10 Essential (primary) hypertension: Secondary | ICD-10-CM

## 2024-03-09 DIAGNOSIS — N1831 Chronic kidney disease, stage 3a: Secondary | ICD-10-CM

## 2024-03-09 DIAGNOSIS — R7309 Other abnormal glucose: Secondary | ICD-10-CM

## 2024-03-09 DIAGNOSIS — Z79899 Other long term (current) drug therapy: Secondary | ICD-10-CM

## 2024-03-09 DIAGNOSIS — K2100 Gastro-esophageal reflux disease with esophagitis, without bleeding: Secondary | ICD-10-CM

## 2024-03-09 DIAGNOSIS — E782 Mixed hyperlipidemia: Secondary | ICD-10-CM | POA: Diagnosis not present

## 2024-03-09 LAB — CBC WITH DIFFERENTIAL/PLATELET
Basophils Absolute: 0.1 K/uL (ref 0.0–0.1)
Basophils Relative: 1.2 % (ref 0.0–3.0)
Eosinophils Absolute: 0.1 K/uL (ref 0.0–0.7)
Eosinophils Relative: 2.1 % (ref 0.0–5.0)
HCT: 40.9 % (ref 36.0–46.0)
Hemoglobin: 13.6 g/dL (ref 12.0–15.0)
Lymphocytes Relative: 33.2 % (ref 12.0–46.0)
Lymphs Abs: 2.1 K/uL (ref 0.7–4.0)
MCHC: 33.4 g/dL (ref 30.0–36.0)
MCV: 85.6 fl (ref 78.0–100.0)
Monocytes Absolute: 0.5 K/uL (ref 0.1–1.0)
Monocytes Relative: 8.1 % (ref 3.0–12.0)
Neutro Abs: 3.6 K/uL (ref 1.4–7.7)
Neutrophils Relative %: 55.4 % (ref 43.0–77.0)
Platelets: 315 K/uL (ref 150.0–400.0)
RBC: 4.77 Mil/uL (ref 3.87–5.11)
RDW: 12.7 % (ref 11.5–15.5)
WBC: 6.5 K/uL (ref 4.0–10.5)

## 2024-03-09 LAB — COMPREHENSIVE METABOLIC PANEL WITH GFR
ALT: 10 U/L (ref 0–35)
AST: 17 U/L (ref 0–37)
Albumin: 4.5 g/dL (ref 3.5–5.2)
Alkaline Phosphatase: 95 U/L (ref 39–117)
BUN: 10 mg/dL (ref 6–23)
CO2: 30 meq/L (ref 19–32)
Calcium: 9.3 mg/dL (ref 8.4–10.5)
Chloride: 101 meq/L (ref 96–112)
Creatinine, Ser: 1.15 mg/dL (ref 0.40–1.20)
GFR: 52.2 mL/min — ABNORMAL LOW (ref >60.00)
Glucose, Bld: 67 mg/dL — ABNORMAL LOW (ref 70–99)
Potassium: 4.2 meq/L (ref 3.5–5.1)
Sodium: 138 meq/L (ref 135–145)
Total Bilirubin: 0.4 mg/dL (ref 0.2–1.2)
Total Protein: 7.4 g/dL (ref 6.0–8.3)

## 2024-03-09 LAB — LIPID PANEL
Cholesterol: 199 mg/dL (ref 0–200)
HDL: 62.8 mg/dL (ref >39.00)
LDL Cholesterol: 79 mg/dL (ref 0–99)
NonHDL: 135.76
Total CHOL/HDL Ratio: 3
Triglycerides: 282 mg/dL — ABNORMAL HIGH (ref 0.0–149.0)
VLDL: 56.4 mg/dL — ABNORMAL HIGH (ref 0.0–40.0)

## 2024-03-09 LAB — VITAMIN D 25 HYDROXY (VIT D DEFICIENCY, FRACTURES): VITD: 36.55 ng/mL (ref 30.00–100.00)

## 2024-03-09 LAB — MICROALBUMIN / CREATININE URINE RATIO
Creatinine,U: 37.2 mg/dL
Microalb Creat Ratio: UNDETERMINED mg/g (ref 0.0–30.0)
Microalb, Ur: <0.7 mg/dL

## 2024-03-09 LAB — VITAMIN B12: Vitamin B-12: 334 pg/mL (ref 211–911)

## 2024-03-09 LAB — TSH: TSH: 2.19 u[IU]/mL (ref 0.35–5.50)

## 2024-03-09 LAB — HEMOGLOBIN A1C: Hgb A1c MFr Bld: 5.7 % (ref 4.6–6.5)

## 2024-03-09 NOTE — Progress Notes (Unsigned)
 EP to read.

## 2024-03-09 NOTE — Patient Instructions (Addendum)
 May add Pepcid  20mg  twice a day with protonix  to help with sore throat with esophagitis.  We have given your flu vaccine today.   I have ordered a CT scan to check for coronary artery calcium .  I will be in touch once I receive those results.  Have also sent in a cardiac monitor for you to wear for 2 weeks.  I have this will be delivered to your house, just send it back to the address provided in the packaging once you are done.  Will let you know how that looks when I get that back.  Follow-up with GI for endoscopy and colon cancer screening.  We are checking labs today, will be in contact with any results that require further attention  Follow up with me in about 3 months for labs and medication management, sooner if needed.

## 2024-03-09 NOTE — Progress Notes (Signed)
 Acute Office Visit  Subjective:     Patient ID: Pamela Welch, female    DOB: 08/25/1965, 58 y.o.   MRN: [id number]  Chief Complaint  Patient presents with   Sore Throat    Barretts esophagus from GERD, really bad sore throat for the last year so thinks reflux is getting worse.    Fatigue    Fatigue has worsened over the last couple months, worried it has to do w kidneys because she knows it is a symptom. Will come in bursts.    HPI  Discussed the use of AI scribe software for clinical note transcription with the patient, who gave verbal consent to proceed.  History of Present Illness Pamela Welch is a 58 year old female with Barrett's esophagus who presents with throat pain and fatigue.  Oropharyngeal pain - Significant throat pain of intense severity - Uncertain if etiology is allergic or infectious  Barrett's esophagus and gastroesophageal reflux disease - Diagnosed with Barrett's esophagus - Takes Protonix  20 mg twice daily - Three years overdue for endoscopy; last performed in 2019 - Concerned about long-term Protonix  use and potential impact on bone density - No history of bone density scan  Fatigue and cardiac history - Profound fatigue occurring two to three times per week - Episodes characterized by periods of energy followed by exhaustion - History of atrial fibrillation and arrhythmias detected approximately 20 years ago - Previous heart monitor detected multiple small cardiac events - Sleep study demonstrated abnormal heart activity during sleep - Family history of heart disease  Hearing impairment - Earwax buildup affecting hearing - Upcoming ENT appointment scheduled  Colorectal cancer screening - Overdue for colonoscopy; last performed in 2015     ROS Per HPI      Objective:    BP (!) 132/90   Pulse 88   Temp 97.6 F (36.4 C) (Temporal)   Ht 5' 9 (1.753 m)   Wt 170 lb (77.1 kg)   LMP 12/26/2015   SpO2 99%   BMI 25.10 kg/m     Physical Exam Vitals and nursing note reviewed.  Constitutional:      General: She is not in acute distress.    Appearance: Normal appearance. She is normal weight.  HENT:     Head: Normocephalic and atraumatic.     Right Ear: External ear normal. A PE tube (Appears malpositioned) is present.     Left Ear: External ear normal. A PE tube is present.     Nose: Nose normal.     Mouth/Throat:     Mouth: Mucous membranes are moist.     Pharynx: Oropharynx is clear.  Eyes:     Extraocular Movements: Extraocular movements intact.     Pupils: Pupils are equal, round, and reactive to light.  Cardiovascular:     Rate and Rhythm: Normal rate and regular rhythm.     Pulses: Normal pulses.     Heart sounds: Normal heart sounds.  Pulmonary:     Effort: Pulmonary effort is normal. No respiratory distress.     Breath sounds: Normal breath sounds. No wheezing, rhonchi or rales.  Musculoskeletal:        General: Normal range of motion.     Cervical back: Normal range of motion.     Right lower leg: No edema.     Left lower leg: No edema.  Lymphadenopathy:     Cervical: No cervical adenopathy.  Neurological:     General: No focal deficit present.  Mental Status: She is alert and oriented to person, place, and time.  Psychiatric:        Mood and Affect: Mood normal.        Thought Content: Thought content normal.     No results found for any visits on 03/09/24.      Assessment & Plan:   Assessment and Plan Assessment & Plan Fatigue Intermittent profound fatigue with differential including cardiac causes, kidney function, and work-related burnout. Family history of heart issues noted. Previous cardiac evaluations unremarkable. - Order heart monitor for two weeks to evaluate for arrhythmias. - Order coronary artery calcium  score CT scan to assess for coronary artery disease. - Perform lab tests including A1c, kidney function, cholesterol, and thyroid  function. - Discuss  possibility of genetic testing due to family history. - Consider lifestyle adjustments for work-related burnout.  Palpitations - Zio patch ordered for 2 weeks - Will follow-up with results as indicated   Chronic kidney disease, stage 3a Monitoring required to assess progression and impact on fatigue. - Perform lab tests to evaluate kidney function.  Barrett's esophagus and gastroesophageal reflux disease with esophagitis Barrett's esophagus with GERD symptoms managed with Protonix . Recent increase in throat discomfort and esophagitis symptoms. Considered impact of long-term Protonix  use on bone health. - Add Pepcid  20 mg twice daily to current regimen. - Order bone density scan due to long-term Protonix  use. - Schedule endoscopy to evaluate current status of Barrett's esophagus.  Essential hypertension - Typically controlled, increased likely due to a bit of anxiety - CBC, CMP, microalbumin creatinine ratio today  Mixed hyperlipidemia - Lipids today - CAC score ordered today - Continue current therapy  Abnormal blood glucose - CMP, A1c today  Vitamin D  deficiency - Vitamin D  levels today  Colon cancer screening  due for colonoscopy  - Schedule colonoscopy. - Has referral to [facility]  Immunization due - Flu vaccine given today     Orders Placed This Encounter  Procedures   DG Bone Density    Standing Status:   Future    Expiration Date:   03/09/2025    Reason for Exam (SYMPTOM  OR DIAGNOSIS REQUIRED):   long term protonix  use    Is patient pregnant?:   No    Preferred imaging location?:   [address]   CT CARDIAC SCORING (SELF PAY ONLY)    Standing Status:   Future    Expiration Date:   03/09/2025    Is patient pregnant?:   No    [facility]erred [facility]   Flu vaccine trivalent PF, 6mos and older(Flulaval,Afluria,Fluarix,Fluzone)   Comprehensive metabolic panel with GFR    Release to patient:   Immediate [1]    Hemoglobin A1c   Lipid panel   Vitamin B12   VITAMIN D  25 Hydroxy (Vit-D Deficiency, Fractures)   CBC with Differential/Platelet    Release to patient:   Immediate [1]   Microalbumin / creatinine urine ratio    Release to patient:   Immediate   TSH   PTH, Intact and Calcium    LONG TERM MONITOR XT (3-14 DAYS)    Standing Status:   Future    Number of Occurrences:   1    Expiration Date:   03/09/2025    Where should this test be performed?:   [facility]    Does the patient have an implanted cardiac device?:   No    Prescribed days of wear:   14  Type of enrollment:   Home Enrollment    Vendor::   [employer]    Reason for Exam:   Palpitations R00.2     No orders of the defined types were placed in this encounter.   Return in about 3 months (around 06/09/2024).  Corean LITTIE Ku, FNP

## 2024-03-11 ENCOUNTER — Ambulatory Visit (INDEPENDENT_AMBULATORY_CARE_PROVIDER_SITE_OTHER): Admitting: Otolaryngology

## 2024-03-12 ENCOUNTER — Ambulatory Visit: Payer: Self-pay | Admitting: Family Medicine

## 2024-03-12 LAB — PTH, INTACT AND CALCIUM
Calcium: 7.9 mg/dL — ABNORMAL LOW (ref 8.6–10.4)
PTH: 43 pg/mL (ref 16–77)

## 2024-03-19 ENCOUNTER — Encounter: Payer: Self-pay | Admitting: Gastroenterology

## 2024-03-31 ENCOUNTER — Encounter: Payer: Self-pay | Admitting: Family Medicine

## 2024-03-31 DIAGNOSIS — E66812 Obesity, class 2: Secondary | ICD-10-CM

## 2024-04-01 MED ORDER — PHENTERMINE HCL 37.5 MG PO TABS
37.5000 mg | ORAL_TABLET | Freq: Every day | ORAL | 5 refills | Status: AC
Start: 2024-04-01 — End: ?

## 2024-04-13 ENCOUNTER — Encounter: Payer: BC Managed Care – PPO | Admitting: Internal Medicine

## 2024-04-14 ENCOUNTER — Other Ambulatory Visit: Payer: Self-pay | Admitting: Family Medicine

## 2024-04-24 ENCOUNTER — Other Ambulatory Visit: Payer: Self-pay | Admitting: Family Medicine

## 2024-04-24 DIAGNOSIS — G4700 Insomnia, unspecified: Secondary | ICD-10-CM

## 2024-04-24 DIAGNOSIS — K2100 Gastro-esophageal reflux disease with esophagitis, without bleeding: Secondary | ICD-10-CM

## 2024-04-30 ENCOUNTER — Encounter: Payer: Self-pay | Admitting: Family Medicine

## 2024-05-03 DIAGNOSIS — Z124 Encounter for screening for malignant neoplasm of cervix: Secondary | ICD-10-CM | POA: Diagnosis not present

## 2024-05-03 DIAGNOSIS — Z6825 Body mass index (BMI) 25.0-25.9, adult: Secondary | ICD-10-CM | POA: Diagnosis not present

## 2024-05-03 DIAGNOSIS — Z1151 Encounter for screening for human papillomavirus (HPV): Secondary | ICD-10-CM | POA: Diagnosis not present

## 2024-05-03 DIAGNOSIS — Z01419 Encounter for gynecological examination (general) (routine) without abnormal findings: Secondary | ICD-10-CM | POA: Diagnosis not present

## 2024-05-10 ENCOUNTER — Ambulatory Visit: Admitting: Gastroenterology

## 2024-05-14 ENCOUNTER — Other Ambulatory Visit: Payer: Self-pay | Admitting: Family Medicine

## 2024-05-14 DIAGNOSIS — G2581 Restless legs syndrome: Secondary | ICD-10-CM

## 2024-05-14 DIAGNOSIS — M542 Cervicalgia: Secondary | ICD-10-CM

## 2024-05-14 DIAGNOSIS — G8929 Other chronic pain: Secondary | ICD-10-CM

## 2024-06-14 NOTE — Progress Notes (Addendum)
 "  Chief Complaint: History of Barrett's esophagus and colon cancer screening Primary GI MD: Dr. Aneita  HPI: Discussed the use of AI scribe software for clinical note transcription with the patient, who gave verbal consent to proceed.  History of Present Illness   Pamela Welch is a 59 year old female with Barrett's esophagus who presents for scheduling of colonoscopy and endoscopy.  She is overdue for endoscopy, with her last procedure in 2019 showing Barrett's esophagus without dysplasia. Her last colonoscopy was on April 15, 2014.   She reports that she is supposed to take pantoprazole  40 mg twice daily for Barrett's esophagus and acid reflux, but often takes it only once daily due to concerns about long-term side effects, including effects on bone mass and nutrient absorption. Sore throat is frequent and described as pretty consistent throughout the day, without a clear pattern related to meals or time of day. She attributes this to acid reflux or Barrett's esophagus and finds it difficult to distinguish from symptoms of illness. She sometimes increases pantoprazole  to twice daily when symptoms worsen, but is unsure if regular twice-daily dosing would resolve her symptoms.  No change in bowel habits or rectal bleeding. She reports recent weight gain, which she attributes to stress, bereavement (loss of her father), and decreased activity during the holidays and winter months. Motivation to exercise is low, and she notes increased consumption of carbohydrates for comfort.   PREVIOUS GI WORKUP   EGD 11/2017 - Esophageal mucosal changes secondary to established short- segment Barrett' s disease. Biopsied.  - Normal stomach.  - Normal duodenal bulb and second portion of the duodenum.  Diagnosis Surgical [P], distal esophagus - ESOPHAGEAL SQUAMOUS AND CARDIAC MUCOSA WITH NO SPECIFIC HISTOPATHOLOGIC CHANGES. - NEGATIVE FOR INTESTINAL METAPLASIA OR DYSPLASIA.  Colonoscopy  03/2014 Normal, internal hemorrhoids   Past Medical History:  Diagnosis Date   Allergy    SEASONAL   Anxiety    Back injury    Barrett's esophagus    Chest pain, unspecified    normal myoview  2000;  echo 5/10:  mild LVH, EF 60-65%   DDD (degenerative disc disease)    Depression    GERD (gastroesophageal reflux disease)    Hiatal hernia    HSV-1 (herpes simplex virus 1) infection    Hyperlipidemia    IBS (irritable bowel syndrome)    Insomnia    Lumbago    Melanosis coli    Palpitations    h/o symptomatic PVCs   Palpitations    Persistent disorder of initiating or maintaining sleep    Restless legs syndrome (RLS)     Past Surgical History:  Procedure Laterality Date   COLONOSCOPY  2013   ESOPHAGOGASTRODUODENOSCOPY N/A 03/04/2014   Procedure: ESOPHAGOGASTRODUODENOSCOPY (EGD);  Surgeon: Gwendlyn ONEIDA Aneita, MD;  Location: [facility];  Service: Endoscopy;  Laterality: N/A;   EYE SURGERY  2001   REFRACTIVE SURGERY     bilateral eyes    TUBES IN EARS     as an adult   UPPER GASTROINTESTINAL ENDOSCOPY      Current Outpatient Medications  Medication Sig Dispense Refill   buPROPion  (WELLBUTRIN  XL) 300 MG 24 hr tablet Take 1 tablet by mouth every morning for mood, focus, and concentration. 90 tablet 0   cyclobenzaprine  (FLEXERIL ) 5 MG tablet Take 1 tablet by mouth three times daily as needed for muscle spasms. 30 tablet 0   estradiol  (ESTRACE ) 0.1 MG/GM vaginal cream Insert 1 gram vaginally using applicator twice weekly at bedtime.  127.5 g 3   fluticasone (FLONASE) 50 MCG/ACT nasal spray Place 2 sprays into both nostrils as needed. Reported on 06/27/2015     gabapentin  (NEURONTIN ) 300 MG capsule Take 1 capsule by mouth three times daily. 270 capsule 0   lidocaine  (XYLOCAINE ) 5 % ointment Apply 1 application topically four times daily as needed. 240 g 3   LINZESS  145 MCG CAPS capsule Take 1 capsule by mouth daily for IBS - C. 90 capsule 2   loteprednol (LOTEMAX) 0.5 % ophthalmic  suspension Instill 1 drop into both eyes four times daily. 10 mL 3   pantoprazole  (PROTONIX ) 40 MG tablet Take 1 tablet 2 x / day  to prevent Indigestion and Heartburn                               /                                                                   TAKE                                         BY                                                 MOUTH 180 tablet 3   phentermine  (ADIPEX-P ) 37.5 MG tablet Take 1 tablet (37.5 mg total) by mouth daily before breakfast. 30 tablet 5   rosuvastatin  (CRESTOR ) 20 MG tablet Take  1 tablet   Daily   for Cholesterol                                                                      /                                                                   TAKE                                         BY                                                 MOUTH 90 tablet 3   traZODone  (DESYREL ) 150 MG tablet Take 2 tablets by mouth 1 hour before bedtime as needed  for sleep. Generic equivalent for Desyrel . 180 tablet 0   valACYclovir  (VALTREX ) 500 MG tablet Take 1 tablet Daily prophylaxis for Cold Sores / Fever Blisters 90 tablet 3   zolpidem  (AMBIEN  CR) 12.5 MG CR tablet Take 1 tablet (12.5 mg total) by mouth at bedtime as needed for sleep. 90 tablet 1   No current facility-administered medications for this visit.    Allergies as of 06/15/2024 - Review Complete 03/09/2024  Allergen Reaction Noted   Atenolol Other (See Comments) 04/14/2013   Ambien  [zolpidem  tartrate]  04/14/2013   Mirapex [pramipexole dihydrochloride]  04/14/2013   Octacosanol  05/06/2011   Requip [ropinirole hcl]  04/14/2013    Family History  Problem Relation Age of Onset   Heart disease Cousin    Lung cancer Cousin    Hypertension Mother    Osteoporosis Mother    Breast cancer Sister 12       BRCA NEG/BILAT MASTECTOMY/CHEMO X 1 YR   Hyperlipidemia Sister    Ovarian cancer Sister    Colon cancer Maternal Grandmother    Stroke Maternal Grandmother    Osteoporosis Maternal  Grandmother    Colon cancer Maternal Grandfather    Multiple myeloma Paternal Grandmother    Hypertension Father    Hyperlipidemia Father    Esophageal cancer Neg Hx    Liver cancer Neg Hx    Pancreatic cancer Neg Hx    Rectal cancer Neg Hx    Stomach cancer Neg Hx     Social History   Socioeconomic History   Marital status: Single    Spouse name: Not on file   Number of children: 2   Years of education: Not on file   Highest education level: Not on file  Occupational History   Occupation: cpa    Employer: [employer]   Tobacco Use   Smoking status: Never   Smokeless tobacco: Never  Vaping Use   Vaping status: Never Used  Substance and Sexual Activity   Alcohol use: Yes    Alcohol/week: 1.0 standard drink of alcohol    Types: 1 Standard drinks or equivalent per week    Comment: Social   Drug use: No   Sexual activity: Yes    Birth control/protection: None, Post-menopausal  Other Topics Concern   Not on file  Social History Narrative   Single.  Lives alone.  2 teenage children.   Social Drivers of Health   Tobacco Use: Low Risk (03/09/2024)   Patient History    Smoking Tobacco Use: Never    Smokeless Tobacco Use: Never    Passive Exposure: Not on file  Financial Resource Strain: Not on file  Food Insecurity: Not on file  Transportation Needs: Not on file  Physical Activity: Not on file  Stress: Not on file  Social Connections: Not on file  Intimate Partner Violence: Not on file  Depression (PHQ2-9): Low Risk (03/09/2024)   Depression (PHQ2-9)    PHQ-2 Score: 0  Alcohol Screen: Not on file  Housing: Not on file  Utilities: Not on file  Health Literacy: Not on file    Review of Systems:    Constitutional: No weight loss, fever, chills, weakness or fatigue HEENT: Eyes: No change in vision               Ears, Nose, Throat:  No change in hearing or congestion Skin: No rash or itching Cardiovascular: No chest pain, chest pressure or palpitations    Respiratory: No SOB or cough Gastrointestinal: See  HPI and otherwise negative Genitourinary: No dysuria or change in urinary frequency Neurological: No headache, dizziness or syncope Musculoskeletal: No new muscle or joint pain Hematologic: No bleeding or bruising Psychiatric: No history of depression or anxiety    Physical Exam:  Vital signs: LMP 12/26/2015   Constitutional: NAD, alert and cooperative Head:  Normocephalic and atraumatic. Eyes:   PEERL, EOMI. No icterus. Conjunctiva pink. Respiratory: Respirations even and unlabored. Lungs clear to auscultation bilaterally.   No wheezes, crackles, or rhonchi.  Cardiovascular:  Regular rate and rhythm. No peripheral edema, cyanosis or pallor.  Gastrointestinal:  Soft, nondistended, nontender. No rebound or guarding. Normal bowel sounds. No appreciable masses or hepatomegaly. Rectal:  Declines Msk:  Symmetrical without gross deformities. Without edema, no deformity or joint abnormality.  Neurologic:  Alert and  oriented x4;  grossly normal neurologically.  Skin:   Dry and intact without significant lesions or rashes. Psychiatric: Oriented to person, place and time. Demonstrates good judgement and reason without abnormal affect or behaviors.  Physical Exam    RELEVANT LABS AND IMAGING: CBC    Component Value Date/Time   WBC 6.5 03/09/2024 1348   RBC 4.77 03/09/2024 1348   HGB 13.6 03/09/2024 1348   HCT 40.9 03/09/2024 1348   PLT 315.0 03/09/2024 1348   MCV 85.6 03/09/2024 1348   MCH 30.7 06/19/2022 1717   MCHC 33.4 03/09/2024 1348   RDW 12.7 03/09/2024 1348   LYMPHSABS 2.1 03/09/2024 1348   MONOABS 0.5 03/09/2024 1348   EOSABS 0.1 03/09/2024 1348   BASOSABS 0.1 03/09/2024 1348    CMP     Component Value Date/Time   NA 138 03/09/2024 1348   K 4.2 03/09/2024 1348   CL 101 03/09/2024 1348   CO2 30 03/09/2024 1348   GLUCOSE 67 (L) 03/09/2024 1348   BUN 10 03/09/2024 1348   CREATININE 1.15 03/09/2024 1348    CREATININE 0.95 06/19/2022 1717   CALCIUM  9.3 03/09/2024 1348   CALCIUM  7.9 (L) 03/09/2024 1348   PROT 7.4 03/09/2024 1348   ALBUMIN 4.5 03/09/2024 1348   AST 17 03/09/2024 1348   ALT 10 03/09/2024 1348   ALKPHOS 95 03/09/2024 1348   BILITOT 0.4 03/09/2024 1348   GFRNONAA 49 (L) 09/26/2020 0936   GFRAA 57 (L) 09/26/2020 0936     Assessment/Plan:   GERD Barrett's esophagus EGD 2019 without dysplasia.  On pantoprazole  40 mg twice daily (mainly takes it once daily) with recurrent symptoms of a persistent sore throat.  Unsure if this improves when increasing to twice daily as she has not been consistent..  Recent CMP with regular kidney function. - Schedule EGD - Increase pantoprazole  40 Mg to twice daily on a regular basis, if no improvement could consider changing medication to voquezna - I thoroughly discussed the procedure with the patient (at bedside) to include nature of the procedure, alternatives, benefits, and risks (including but not limited to bleeding, infection, perforation, anesthesia/cardiac pulmonary complications).  Patient verbalized understanding and gave verbal consent to proceed with procedure. - Discussed risk versus benefit of long-term PPI use  Colon cancer screening Colonoscopy 2015 with hemorrhoids and otherwise normal.  And recall 10 years - Repeat 03/2014 - Schedule colonoscopy - I thoroughly discussed the procedure with the patient (at bedside) to include nature of the procedure, alternatives, benefits, and risks (including but not limited to bleeding, infection, perforation, anesthesia/cardiac pulmonary complications).  Patient verbalized understanding and gave verbal consent to proceed with procedure.   Patient prefers female provider, assigned to  based on schedule availability   Nestor Mollie RIGGERS [facility] 06/14/2024, 10:05 PM  Cc: Alvia Corean CROME, FNP  I have reviewed the clinic note as outlined by Nestor Mollie, PA and  agree with the assessment, plan and medical decision making.  Ms. Jastrzebski returns to the office for follow-up of Barrett's esophagus and colorectal cancer screening.  Has a history of nondysplastic Barrett's esophagus on EGD in 2019.  Prescribed pantoprazole  twice daily but has been taking it once daily.  Symptoms generally well-controlled.  Had a normal colonoscopy in 2015.  Due for both Barrett's esophagus surveillance and colorectal cancer screening which can be performed at the same time.  Inocente Hausen, MD  "

## 2024-06-15 ENCOUNTER — Encounter: Payer: Self-pay | Admitting: Gastroenterology

## 2024-06-15 ENCOUNTER — Ambulatory Visit: Admitting: Gastroenterology

## 2024-06-15 VITALS — BP 110/70 | HR 78 | Ht 69.0 in | Wt 177.0 lb

## 2024-06-15 DIAGNOSIS — Z1211 Encounter for screening for malignant neoplasm of colon: Secondary | ICD-10-CM

## 2024-06-15 DIAGNOSIS — K2270 Barrett's esophagus without dysplasia: Secondary | ICD-10-CM

## 2024-06-15 DIAGNOSIS — K219 Gastro-esophageal reflux disease without esophagitis: Secondary | ICD-10-CM

## 2024-06-15 MED ORDER — PANTOPRAZOLE SODIUM 40 MG PO TBEC: 40.0000 mg | DELAYED_RELEASE_TABLET | Freq: Two times a day (BID) | ORAL | 0 refills | Status: AC

## 2024-06-15 MED ORDER — NA SULFATE-K SULFATE-MG SULF 17.5-3.13-1.6 GM/177ML PO SOLN: 1.0000 | Freq: Once | ORAL | 0 refills | Status: AC

## 2024-06-15 NOTE — Patient Instructions (Addendum)
 _______________________________________________________  If your blood pressure at your visit was 140/90 or greater, please contact your primary care physician to follow up on this.  _______________________________________________________  If you are age 60 or older, your body mass index should be between 23-30. Your Body mass index is 26.14 kg/m. If this is out of the aforementioned range listed, please consider follow up with your Primary Care Provider.  If you are age 60 or younger, your body mass index should be between 19-25. Your Body mass index is 26.14 kg/m. If this is out of the aformentioned range listed, please consider follow up with your Primary Care Provider.   ________________________________________________________  The [facility] providers would like to encourage you to use MYCHART to communicate with providers for non-urgent requests or questions.  Due to long hold times on the telephone, sending your provider a message by [facility] may be a faster and more efficient way to get a response.  Please allow 48 business hours for a response.  Please remember that this is for non-urgent requests.  _______________________________________________________  [facility] is using a team-based approach to care.  Your team is made up of your doctor and two to three APPS. Our APPS (Nurse Practitioners and Physician Assistants) work with your physician to ensure care continuity for you. They are fully qualified to address your health concerns and develop a treatment plan. They communicate directly with your gastroenterologist to care for you. Seeing the Advanced Practice Practitioners on your physician's team can help you by facilitating care more promptly, often allowing for earlier appointments, access to diagnostic testing, procedures, and other specialty referrals.    We have sent the following medications to your pharmacy for you to pick up at your  convenience: Suprep Protonix   STOP taking Phentermine  7 days before procedure   You have been scheduled for an endoscopy and colonoscopy. Please follow the written instructions given to you at your visit today.  If you use inhalers (even only as needed), please bring them with you on the day of your procedure.  DO NOT TAKE 7 DAYS PRIOR TO TEST- Trulicity (dulaglutide) Ozempic, Wegovy (semaglutide) Mounjaro, Zepbound (tirzepatide) Bydureon Bcise (exanatide extended release)  DO NOT TAKE 1 DAY PRIOR TO YOUR TEST Rybelsus (semaglutide) Adlyxin (lixisenatide) Victoza (liraglutide) Byetta (exanatide) ___________________________________________________________________________  It was a pleasure to see you today!  Thank you for trusting me with your gastrointestinal care!

## 2024-06-21 ENCOUNTER — Encounter: Payer: BC Managed Care – PPO | Admitting: Internal Medicine

## 2024-06-25 ENCOUNTER — Encounter: Payer: Self-pay | Admitting: Family Medicine

## 2024-06-27 NOTE — Progress Notes (Unsigned)
 [facility] History and Physical   Primary Care Physician:  Alvia Corean CROME, FNP   Reason for Procedure:  Surveillance of Barrett's esophagus and colorectal cancer screening  Plan:    Upper endoscopy and colonoscopy   The patient was provided an opportunity to ask questions and all were answered. The patient agreed with the plan.   HPI: Pamela Welch is a 59 y.o. female undergoing upper endoscopy for surveillance of Barrett's esophagus and colonoscopy for colorectal cancer screening  Patient has a history of GERD currently on pantoprazole  40 mg p.o. twice daily EGD 2019 showed short segment nondysplastic Barrett's esophagus No dysphagia or dyne aphasia  Colonoscopy 2015 normal without polyps Family history of colorectal cancer in maternal grandfather and maternal grandmother   Past Medical History:  Diagnosis Date   Allergy    SEASONAL   Anxiety    Back injury    Barrett's esophagus    Chest pain, unspecified    normal myoview  2000;  echo 5/10:  mild LVH, EF 60-65%   Chronic kidney disease    stage 3   DDD (degenerative disc disease)    Depression    GERD (gastroesophageal reflux disease)    Hiatal hernia    HSV-1 (herpes simplex virus 1) infection    Hyperlipidemia    IBS (irritable bowel syndrome)    Insomnia    Lumbago    Melanosis coli    Palpitations    h/o symptomatic PVCs   Palpitations    Persistent disorder of initiating or maintaining sleep    Restless legs syndrome (RLS)    UTI (urinary tract infection)    often    Past Surgical History:  Procedure Laterality Date   COLONOSCOPY  2013   ESOPHAGOGASTRODUODENOSCOPY N/A 03/04/2014   Procedure: ESOPHAGOGASTRODUODENOSCOPY (EGD);  Surgeon: Gwendlyn ONEIDA Buddy, MD;  Location: [facility];  Service: Endoscopy;  Laterality: N/A;   EYE SURGERY  2001   REFRACTIVE SURGERY     bilateral eyes    TUBES IN EARS     as an adult   UPPER GASTROINTESTINAL ENDOSCOPY      Prior to Admission  medications  Medication Sig Start Date End Date Taking? Authorizing Provider  buPROPion  (WELLBUTRIN  XL) 300 MG 24 hr tablet Take 1 tablet by mouth every morning for mood, focus, and concentration. 04/26/24   Alvia Corean CROME, FNP  cyclobenzaprine  (FLEXERIL ) 5 MG tablet Take 1 tablet by mouth three times daily as needed for muscle spasms. 10/28/23   Alvia Corean CROME, FNP  estradiol  (ESTRACE ) 0.1 MG/GM vaginal cream Insert 1 gram vaginally using applicator twice weekly at bedtime. 02/04/24   Alvia Corean CROME, FNP  fluticasone (FLONASE) 50 MCG/ACT nasal spray Place 2 sprays into both nostrils as needed. Reported on 06/27/2015    [provider]  gabapentin  (NEURONTIN ) 300 MG capsule Take 1 capsule by mouth three times daily. 05/14/24   Alvia Corean CROME, FNP  LINZESS  145 MCG CAPS capsule Take 1 capsule by mouth daily for IBS - C. 09/17/23   Alvia Corean CROME, FNP  pantoprazole  (PROTONIX ) 40 MG tablet Take 1 tablet 2 x / day  to prevent Indigestion and Heartburn                               /  TAKE                                         BY                                                 MOUTH 02/05/24   Alvia Corean CROME, FNP  pantoprazole  (PROTONIX ) 40 MG tablet Take 1 tablet (40 mg total) by mouth 2 (two) times daily. 06/15/24   Mollie Nestor HERO, PA-C  phentermine  (ADIPEX-P ) 37.5 MG tablet Take 1 tablet (37.5 mg total) by mouth daily before breakfast. 04/01/24   Alvia Corean CROME, FNP  rosuvastatin  (CRESTOR ) 20 MG tablet Take  1 tablet   Daily   for Cholesterol                                                                      /                                                                   TAKE                                         BY                                                 MOUTH 02/05/24   Alvia Corean CROME, FNP  traZODone  (DESYREL ) 150 MG tablet Take 2 tablets by mouth 1 hour before  bedtime as needed for sleep. Generic equivalent for Desyrel . 04/26/24   Alvia Corean CROME, FNP  valACYclovir  (VALTREX ) 500 MG tablet Take 1 tablet Daily prophylaxis for Cold Sores / Fever Blisters 06/18/19   Tonita Fallow, MD    Current Outpatient Medications  Medication Sig Dispense Refill   buPROPion  (WELLBUTRIN  XL) 300 MG 24 hr tablet Take 1 tablet by mouth every morning for mood, focus, and concentration. 90 tablet 0   estradiol  (ESTRACE ) 0.1 MG/GM vaginal cream Insert 1 gram vaginally using applicator twice weekly at bedtime. 127.5 g 3   gabapentin  (NEURONTIN ) 300 MG capsule Take 1 capsule by mouth three times daily. 270 capsule 0   LINZESS  145 MCG CAPS capsule Take 1 capsule by mouth daily for IBS - C. 90 capsule 2   pantoprazole  (PROTONIX ) 40 MG tablet Take 1 tablet 2 x / day  to prevent Indigestion and Heartburn                               /  TAKE                                         BY                                                 MOUTH 180 tablet 3   pantoprazole  (PROTONIX ) 40 MG tablet Take 1 tablet (40 mg total) by mouth 2 (two) times daily. 180 tablet 0   phentermine  (ADIPEX-P ) 37.5 MG tablet Take 1 tablet (37.5 mg total) by mouth daily before breakfast. 30 tablet 5   rosuvastatin  (CRESTOR ) 20 MG tablet Take  1 tablet   Daily   for Cholesterol                                                                      /                                                                   TAKE                                         BY                                                 MOUTH 90 tablet 3   traZODone  (DESYREL ) 150 MG tablet Take 2 tablets by mouth 1 hour before bedtime as needed for sleep. Generic equivalent for Desyrel . 180 tablet 0   cyclobenzaprine  (FLEXERIL ) 5 MG tablet Take 1 tablet by mouth three times daily as needed for muscle spasms. 30 tablet 0   fluticasone (FLONASE) 50 MCG/ACT nasal spray Place 2  sprays into both nostrils as needed. Reported on 06/27/2015     valACYclovir  (VALTREX ) 500 MG tablet Take 1 tablet Daily prophylaxis for Cold Sores / Fever Blisters 90 tablet 3   Current Facility-Administered Medications  Medication Dose Route Frequency Provider Last Rate Last Admin   0.9 %  sodium chloride  infusion  500 mL Intravenous Continuous Taysen Bushart, Inocente HERO, MD        Allergies as of 07/01/2024 - Review Complete 07/01/2024  Allergen Reaction Noted   Atenolol Other (See Comments) 04/14/2013   Ambien  [zolpidem  tartrate] Other (See Comments) 04/14/2013   Mirapex [pramipexole dihydrochloride] Other (See Comments) 04/14/2013   Octacosanol Other (See Comments) 05/06/2011   Requip [ropinirole hcl] Other (See Comments) 04/14/2013    Family History  Problem Relation Age of Onset   Hypertension Mother    Osteoporosis Mother    Hypertension Father    Hyperlipidemia Father  Breast cancer Sister 61       BRCA NEG/BILAT MASTECTOMY/CHEMO X 1 YR   Hyperlipidemia Sister    Ovarian cancer Sister    Colon cancer Maternal Grandmother    Stroke Maternal Grandmother    Osteoporosis Maternal Grandmother    Colon cancer Maternal Grandfather    Multiple myeloma Paternal Grandmother    Heart disease Cousin    Lung cancer Cousin    Esophageal cancer Neg Hx    Liver cancer Neg Hx    Pancreatic cancer Neg Hx    Rectal cancer Neg Hx    Stomach cancer Neg Hx     Social History   Socioeconomic History   Marital status: Single    Spouse name: Not on file   Number of children: 2   Years of education: Not on file   Highest education level: Not on file  Occupational History   Occupation: cpa    Employer: [employer]   Tobacco Use   Smoking status: Never   Smokeless tobacco: Never  Vaping Use   Vaping status: Never Used  Substance and Sexual Activity   Alcohol use: Yes    Alcohol/week: 1.0 standard drink of alcohol    Types: 1 Standard drinks or equivalent per week    Comment: Social    Drug use: No   Sexual activity: Yes    Birth control/protection: None, Post-menopausal  Other Topics Concern   Not on file  Social History Narrative   Single.  Lives alone.  2 teenage children.   Social Drivers of Health   Tobacco Use: Low Risk (07/01/2024)   Patient History    Smoking Tobacco Use: Never    Smokeless Tobacco Use: Never    Passive Exposure: Not on file  Financial Resource Strain: Not on file  Food Insecurity: Not on file  Transportation Needs: Not on file  Physical Activity: Not on file  Stress: Not on file  Social Connections: Not on file  Intimate Partner Violence: Not on file  Depression (PHQ2-9): Low Risk (03/09/2024)   Depression (PHQ2-9)    PHQ-2 Score: 0  Alcohol Screen: Not on file  Housing: Not on file  Utilities: Not on file  Health Literacy: Not on file    Review of Systems:  All other review of systems negative except as mentioned in the HPI.  Physical Exam: Vital signs BP (!) 143/84   Pulse 75   Temp (!) 97.3 F (36.3 C)   Resp 16   Ht 5' 9 (1.753 m)   Wt 177 lb (80.3 kg)   LMP 12/26/2015   SpO2 100%   BMI 26.14 kg/m   General:   Alert,  Well-developed, well-nourished, pleasant and cooperative in NAD Airway:  Mallampati 2 Lungs:  Clear throughout to auscultation.   Heart:  Regular rate and rhythm; no murmurs, clicks, rubs,  or gallops. Abdomen:  Soft, nontender and nondistended. Normal bowel sounds.   Neuro/Psych:  Normal mood and affect. A and O x 3  Inocente Hausen, MD [facility]

## 2024-07-01 ENCOUNTER — Encounter: Payer: Self-pay | Admitting: Pediatrics

## 2024-07-01 ENCOUNTER — Ambulatory Visit: Admitting: Pediatrics

## 2024-07-01 VITALS — BP 126/68 | HR 61 | Temp 97.3°F | Resp 19 | Ht 69.0 in | Wt 177.0 lb

## 2024-07-01 DIAGNOSIS — K2270 Barrett's esophagus without dysplasia: Secondary | ICD-10-CM

## 2024-07-01 DIAGNOSIS — Z1211 Encounter for screening for malignant neoplasm of colon: Secondary | ICD-10-CM

## 2024-07-01 DIAGNOSIS — K219 Gastro-esophageal reflux disease without esophagitis: Secondary | ICD-10-CM

## 2024-07-01 MED ORDER — SODIUM CHLORIDE 0.9 % IV SOLN: 500.0000 mL | INTRAVENOUS | Status: DC

## 2024-07-01 NOTE — Patient Instructions (Addendum)
 YOU HAD AN ENDOSCOPIC PROCEDURE TODAY AT THE [facility]:   Refer to the procedure report that was given to you for any specific questions about what was found during the examination.  If the procedure report does not answer your questions, please call your gastroenterologist to clarify.  If you requested that your care partner not be given the details of your procedure findings, then the procedure report has been included in a sealed envelope for you to review at your convenience later.  YOU SHOULD EXPECT: Some feelings of bloating in the abdomen. Passage of more gas than usual.  Walking can help get rid of the air that was put into your GI tract during the procedure and reduce the bloating. If you had a lower endoscopy (such as a colonoscopy or flexible sigmoidoscopy) you may notice spotting of blood in your stool or on the toilet paper. If you underwent a bowel prep for your procedure, you may not have a normal bowel movement for a few days.  Please Note:  You might notice some irritation and congestion in your nose or some drainage.  This is from the oxygen used during your procedure.  There is no need for concern and it should clear up in a day or so.  SYMPTOMS TO REPORT IMMEDIATELY:  Following lower endoscopy (colonoscopy or flexible sigmoidoscopy):  Excessive amounts of blood in the stool  Significant tenderness or worsening of abdominal pains  Swelling of the abdomen that is new, acute  Fever of 100F or higher  Following upper endoscopy (EGD)  Vomiting of blood or coffee ground material  New chest pain or pain under the shoulder blades  Painful or persistently difficult swallowing  New shortness of breath  Fever of 100F or higher  Black, tarry-looking stools  Await pathology results Repeat colonoscopy in 10 years  Handouts on diverticulosis and hemorrhoids given   For urgent or emergent issues, a gastroenterologist can be reached at any hour by calling [phone]  [phone]. Do not use MyChart messaging for urgent concerns.    DIET:  We do recommend a small meal at first, but then you may proceed to your regular diet.  Drink plenty of fluids but you should avoid alcoholic beverages for 24 hours.  ACTIVITY:  You should plan to take it easy for the rest of today and you should NOT DRIVE or use heavy machinery until tomorrow (because of the sedation medicines used during the test).    FOLLOW UP: Our staff will call the number listed on your records the next business day following your procedure.  We will call around 7:15- 8:00 am to check on you and address any questions or concerns that you may have regarding the information given to you following your procedure. If we do not reach you, we will leave a message.     If any biopsies were taken you will be contacted by phone or by letter within the next 1-3 weeks.  Please call us  at [phone] if you have not heard about the biopsies in 3 weeks.    SIGNATURES/CONFIDENTIALITY: You and/or your care partner have signed paperwork which will be entered into your electronic medical record.  These signatures attest to the fact that that the information above on your After Visit Summary has been reviewed and is understood.  Full responsibility of the confidentiality of this discharge information lies with you and/or your care-partner.

## 2024-07-01 NOTE — Op Note (Addendum)
 [facility] Patient Name: Pamela Welch Procedure Date: 07/01/2024 1:56 PM MRN: [id number] Endoscopist: Inocente Hausen , MD, [id number] Age: 59 Referring MD:  Date of Birth: 1966-02-22 Gender: Female Account #: [id number] Procedure:                Colonoscopy Indications:              Screening for colorectal malignant neoplasm, Last                            colonoscopy: 2015; Family history of colorectal                            cancer?maternal grandfather and maternal grandmother Procedure:                Pre-Anesthesia Assessment:                           - Prior to the procedure, a History and Physical                            was performed, and patient medications and                            allergies were reviewed. The patient's tolerance of                            previous anesthesia was also reviewed. The risks                            and benefits of the procedure and the sedation                            options and risks were discussed with the patient.                            All questions were answered, and informed consent                            was obtained. Prior Anticoagulants: The patient has                            taken no anticoagulant or antiplatelet agents. ASA                            Grade Assessment: II - A patient with mild systemic                            disease. After reviewing the risks and benefits,                            the patient was deemed in satisfactory condition to                            undergo the procedure.  After obtaining informed consent, the colonoscope                            was passed under direct vision. Throughout the                            procedure, the patient's blood pressure, pulse, and                            oxygen saturations were monitored continuously.The                            colonoscopy was performed without difficulty. The                             patient tolerated the procedure well. The quality                            of the bowel preparation was good. The terminal                            ileum, ileocecal valve, appendiceal orifice, and                            rectum were photographed. The CF [id number] #[id number]                            was introduced through the and advanced to the. Scope In: 2:25:24 PM Scope Out: 2:37:31 PM Scope Withdrawal Time: 0 hours 9 minutes 33 seconds  Total Procedure Duration: 0 hours 12 minutes 7 seconds  Findings:                 The perianal and digital rectal examinations were                            normal. Pertinent negatives include normal                            sphincter tone and no palpable rectal lesions.                           A few small-mouthed diverticula were found in the                            sigmoid colon.                           The terminal ileum appeared normal.                           Internal hemorrhoids were found during retroflexion. Complications:            No immediate complications. Estimated blood loss:  None. Estimated Blood Loss:     Estimated blood loss: none. Impression:               - Diverticulosis in the sigmoid colon.                           - The examined portion of the ileum was normal.                           - Internal hemorrhoids.                           - No specimens collected. Recommendation:           - Discharge patient to home (ambulatory).                           - Repeat colonoscopy in 10 years for screening                            purposes.                           - The findings and recommendations were discussed                            with the patient's family.                           - Patient has a contact number available for                            emergencies. The signs and symptoms of potential                            delayed complications were discussed  with the                            patient. Return to normal activities tomorrow.                            Written discharge instructions were provided to the                            patient. Inocente Hausen, MD 07/01/2024 2:42:05 PM This report has been signed electronically.

## 2024-07-01 NOTE — Progress Notes (Signed)
 Report to PACU, RN, vss, BBS= Clear.

## 2024-07-01 NOTE — Progress Notes (Signed)
 Pt's states no medical or surgical changes since previsit or office visit.

## 2024-07-01 NOTE — Progress Notes (Signed)
 Called to room to assist during endoscopic procedure.  Patient ID and intended procedure confirmed with present staff. Received instructions for my participation in the procedure from the performing physician.

## 2024-07-01 NOTE — Op Note (Signed)
 [facility] Patient Name: Pamela Welch Procedure Date: 07/01/2024 2:05 PM MRN: [id number] Endoscopist: Inocente Hausen , MD, [id number] Age: 59 Referring MD:  Date of Birth: 13-Apr-1966 Gender: Female Account #: [id number] Procedure:                Upper GI endoscopy Indications:              Follow-up of gastro-esophageal reflux disease,                            Surveillance for malignancy due to personal history                            of Barrett's esophagus - non-dysplastic Barrett's                            esophagus on EGD 2019 Medicines:                Monitored Anesthesia Care Procedure:                Pre-Anesthesia Assessment:                           - Prior to the procedure, a History and Physical                            was performed, and patient medications and                            allergies were reviewed. The patient's tolerance of                            previous anesthesia was also reviewed. The risks                            and benefits of the procedure and the sedation                            options and risks were discussed with the patient.                            All questions were answered, and informed consent                            was obtained. Prior Anticoagulants: The patient has                            taken no anticoagulant or antiplatelet agents. ASA                            Grade Assessment: II - A patient with mild systemic                            disease. After reviewing the risks and benefits,  the patient was deemed in satisfactory condition to                            undergo the procedure.                           After obtaining informed consent, the endoscope was                            passed under direct vision. Throughout the                            procedure, the patient's blood pressure, pulse, and                            oxygen saturations were monitored  continuously. The                            Olympus scope [id number] was introduced through the                            mouth, and advanced to the second part of duodenum.                            The upper GI endoscopy was accomplished without                            difficulty. The patient tolerated the procedure                            well. Scope In: Scope Out: Findings:                 The upper third of the esophagus and middle third                            of the esophagus were normal.                           There were esophageal mucosal changes secondary to                            established short-segment Barrett's disease present                            in the lower third of the esophagus. The maximum                            longitudinal extent of these mucosal changes was 1                            cm in length. Mucosa was biopsied with a cold                            forceps for  histology in 4 quadrants at intervals                            of 1 cm in the lower third of the esophagus. One                            specimen bottle was sent to pathology.                           The gastric body, gastric antrum, cardia (on                            retroflexion) and gastric fundus (on retroflexion)                            were normal.                           The duodenal bulb and second portion of the                            duodenum were normal. Complications:            No immediate complications. Estimated blood loss:                            Minimal. Estimated Blood Loss:     Estimated blood loss was minimal. Impression:               - Normal upper third of esophagus and middle third                            of esophagus.                           - Esophageal mucosal changes secondary to                            established short-segment Barrett's disease.                            Biopsied.                           -  Normal gastric body, antrum, cardia and gastric                            fundus.                           - Normal duodenal bulb and second portion of the                            duodenum. Recommendation:           - Await pathology results.                           -  Perform a colonoscopy today.                           - The findings and recommendations were discussed                            with the patient's family. Inocente Hausen, MD 07/01/2024 2:23:46 PM This report has been signed electronically.

## 2024-07-02 ENCOUNTER — Telehealth: Payer: Self-pay

## 2024-07-02 NOTE — Telephone Encounter (Signed)
"  Left HIPAA compliant voicemail.   "
